# Patient Record
Sex: Male | Born: 1960 | Race: Black or African American | Hispanic: No | Marital: Married | Smoking: Never smoker
Health system: Southern US, Community
[De-identification: ages and names within clinical notes are randomized; demographics above are authoritative.]

## PROBLEM LIST (undated history)

## (undated) DIAGNOSIS — E785 Hyperlipidemia, unspecified: Secondary | ICD-10-CM

## (undated) DIAGNOSIS — K219 Gastro-esophageal reflux disease without esophagitis: Secondary | ICD-10-CM

## (undated) DIAGNOSIS — R030 Elevated blood-pressure reading, without diagnosis of hypertension: Secondary | ICD-10-CM

## (undated) DIAGNOSIS — E7800 Pure hypercholesterolemia, unspecified: Secondary | ICD-10-CM

## (undated) DIAGNOSIS — I442 Atrioventricular block, complete: Secondary | ICD-10-CM

## (undated) DIAGNOSIS — I2510 Atherosclerotic heart disease of native coronary artery without angina pectoris: Secondary | ICD-10-CM

## (undated) DIAGNOSIS — N182 Chronic kidney disease, stage 2 (mild): Secondary | ICD-10-CM

## (undated) DIAGNOSIS — Z951 Presence of aortocoronary bypass graft: Secondary | ICD-10-CM

## (undated) HISTORY — DX: Gastro-esophageal reflux disease without esophagitis: K21.9

## (undated) HISTORY — PX: CARDIAC CATHETERIZATION: SHX172

## (undated) HISTORY — DX: Pure hypercholesterolemia, unspecified: E78.00

## (undated) HISTORY — DX: Presence of aortocoronary bypass graft: Z95.1

## (undated) HISTORY — DX: Atherosclerotic heart disease of native coronary artery without angina pectoris: I25.10

## (undated) HISTORY — DX: Elevated blood-pressure reading, without diagnosis of hypertension: R03.0

---

## 2007-05-19 DIAGNOSIS — E7800 Pure hypercholesterolemia, unspecified: Secondary | ICD-10-CM | POA: Insufficient documentation

## 2007-05-19 HISTORY — DX: Pure hypercholesterolemia, unspecified: E78.00

## 2013-06-14 DIAGNOSIS — I10 Essential (primary) hypertension: Secondary | ICD-10-CM

## 2013-06-14 DIAGNOSIS — R030 Elevated blood-pressure reading, without diagnosis of hypertension: Secondary | ICD-10-CM

## 2013-06-14 DIAGNOSIS — K219 Gastro-esophageal reflux disease without esophagitis: Secondary | ICD-10-CM

## 2013-06-14 HISTORY — DX: Essential (primary) hypertension: I10

## 2013-06-14 HISTORY — DX: Elevated blood-pressure reading, without diagnosis of hypertension: R03.0

## 2013-06-14 HISTORY — DX: Gastro-esophageal reflux disease without esophagitis: K21.9

## 2017-10-12 ENCOUNTER — Encounter (HOSPITAL_COMMUNITY)
Admission: AD | Disposition: A | Discharge status: Home / Self Care | Payer: Self-pay | Source: Other Acute Inpatient Hospital | Attending: Cardiothoracic Surgery

## 2017-10-12 ENCOUNTER — Inpatient Hospital Stay (HOSPITAL_COMMUNITY): Payer: 59

## 2017-10-12 ENCOUNTER — Inpatient Hospital Stay (HOSPITAL_COMMUNITY)
Admission: AD | Admit: 2017-10-12 | Discharge: 2017-10-17 | DRG: 234 | Disposition: A | Discharge status: Home / Self Care | Payer: 59 | Source: Other Acute Inpatient Hospital | Attending: Cardiothoracic Surgery | Admitting: Cardiothoracic Surgery

## 2017-10-12 DIAGNOSIS — E8770 Fluid overload, unspecified: Secondary | ICD-10-CM | POA: Diagnosis present

## 2017-10-12 DIAGNOSIS — Z8249 Family history of ischemic heart disease and other diseases of the circulatory system: Secondary | ICD-10-CM | POA: Diagnosis not present

## 2017-10-12 DIAGNOSIS — D696 Thrombocytopenia, unspecified: Secondary | ICD-10-CM | POA: Diagnosis present

## 2017-10-12 DIAGNOSIS — E785 Hyperlipidemia, unspecified: Secondary | ICD-10-CM

## 2017-10-12 DIAGNOSIS — I2510 Atherosclerotic heart disease of native coronary artery without angina pectoris: Secondary | ICD-10-CM | POA: Diagnosis present

## 2017-10-12 DIAGNOSIS — I25110 Atherosclerotic heart disease of native coronary artery with unstable angina pectoris: Secondary | ICD-10-CM

## 2017-10-12 DIAGNOSIS — Z09 Encounter for follow-up examination after completed treatment for conditions other than malignant neoplasm: Secondary | ICD-10-CM

## 2017-10-12 DIAGNOSIS — R079 Chest pain, unspecified: Secondary | ICD-10-CM

## 2017-10-12 DIAGNOSIS — Z6838 Body mass index (BMI) 38.0-38.9, adult: Secondary | ICD-10-CM | POA: Diagnosis not present

## 2017-10-12 DIAGNOSIS — R001 Bradycardia, unspecified: Secondary | ICD-10-CM | POA: Diagnosis present

## 2017-10-12 DIAGNOSIS — Z01810 Encounter for preprocedural cardiovascular examination: Secondary | ICD-10-CM

## 2017-10-12 DIAGNOSIS — I517 Cardiomegaly: Secondary | ICD-10-CM | POA: Diagnosis present

## 2017-10-12 DIAGNOSIS — I214 Non-ST elevation (NSTEMI) myocardial infarction: Principal | ICD-10-CM | POA: Diagnosis present

## 2017-10-12 DIAGNOSIS — D62 Acute posthemorrhagic anemia: Secondary | ICD-10-CM | POA: Diagnosis not present

## 2017-10-12 DIAGNOSIS — R7303 Prediabetes: Secondary | ICD-10-CM | POA: Diagnosis present

## 2017-10-12 DIAGNOSIS — E669 Obesity, unspecified: Secondary | ICD-10-CM | POA: Diagnosis present

## 2017-10-12 DIAGNOSIS — L918 Other hypertrophic disorders of the skin: Secondary | ICD-10-CM | POA: Diagnosis present

## 2017-10-12 DIAGNOSIS — I4891 Unspecified atrial fibrillation: Secondary | ICD-10-CM | POA: Diagnosis not present

## 2017-10-12 DIAGNOSIS — Z951 Presence of aortocoronary bypass graft: Secondary | ICD-10-CM

## 2017-10-12 DIAGNOSIS — J939 Pneumothorax, unspecified: Secondary | ICD-10-CM

## 2017-10-12 HISTORY — DX: Hyperlipidemia, unspecified: E78.5

## 2017-10-12 HISTORY — PX: ULTRASOUND GUIDANCE FOR VASCULAR ACCESS: SHX6516

## 2017-10-12 HISTORY — PX: LEFT HEART CATH AND CORONARY ANGIOGRAPHY: CATH118249

## 2017-10-12 HISTORY — PX: INTRAVASCULAR PRESSURE WIRE/FFR STUDY: CATH118243

## 2017-10-12 HISTORY — DX: Non-ST elevation (NSTEMI) myocardial infarction: I21.4

## 2017-10-12 LAB — LIPID PANEL
Cholesterol: 264 mg/dL — ABNORMAL HIGH (ref 0–200)
HDL: 60 mg/dL (ref >40)
LDL Cholesterol: 179 mg/dL — ABNORMAL HIGH (ref 0–99)
Total CHOL/HDL Ratio: 4.4 RATIO
Triglycerides: 126 mg/dL (ref <150)
VLDL: 25 mg/dL (ref 0–40)

## 2017-10-12 LAB — CBC WITH DIFFERENTIAL/PLATELET
Basophils Absolute: 0 10*3/uL (ref 0.0–0.1)
Basophils Relative: 0 %
Eosinophils Absolute: 0 10*3/uL (ref 0.0–0.7)
Eosinophils Relative: 1 %
HCT: 40.2 % (ref 39.0–52.0)
Hemoglobin: 13.9 g/dL (ref 13.0–17.0)
Lymphocytes Relative: 16 %
Lymphs Abs: 1.3 10*3/uL (ref 0.7–4.0)
MCH: 30.9 pg (ref 26.0–34.0)
MCHC: 34.6 g/dL (ref 30.0–36.0)
MCV: 89.3 fL (ref 78.0–100.0)
Monocytes Absolute: 0.2 10*3/uL (ref 0.1–1.0)
Monocytes Relative: 3 %
Neutro Abs: 6.3 10*3/uL (ref 1.7–7.7)
Neutrophils Relative %: 80 %
Platelets: 157 10*3/uL (ref 150–400)
RBC: 4.5 MIL/uL (ref 4.22–5.81)
RDW: 13.6 % (ref 11.5–15.5)
WBC: 7.9 10*3/uL (ref 4.0–10.5)

## 2017-10-12 LAB — COMPREHENSIVE METABOLIC PANEL
ALT: 27 U/L (ref 17–63)
ALT: 27 U/L (ref 17–63)
AST: 41 U/L (ref 15–41)
AST: 42 U/L — ABNORMAL HIGH (ref 15–41)
Albumin: 3.7 g/dL (ref 3.5–5.0)
Albumin: 3.7 g/dL (ref 3.5–5.0)
Alkaline Phosphatase: 71 U/L (ref 38–126)
Alkaline Phosphatase: 75 U/L (ref 38–126)
Anion gap: 7 (ref 5–15)
Anion gap: 6 (ref 5–15)
BUN: 18 mg/dL (ref 6–20)
BUN: 11 mg/dL (ref 6–20)
CO2: 27 mmol/L (ref 22–32)
CO2: 27 mmol/L (ref 22–32)
Calcium: 8.9 mg/dL (ref 8.9–10.3)
Calcium: 9.1 mg/dL (ref 8.9–10.3)
Chloride: 101 mmol/L (ref 101–111)
Chloride: 104 mmol/L (ref 101–111)
Creatinine, Ser: 1.15 mg/dL (ref 0.61–1.24)
Creatinine, Ser: 1.1 mg/dL (ref 0.61–1.24)
GFR calc Af Amer: >60 mL/min (ref >60)
GFR calc Af Amer: >60 mL/min (ref >60)
GFR calc non Af Amer: >60 mL/min (ref >60)
GFR calc non Af Amer: >60 mL/min (ref >60)
Glucose, Bld: 103 mg/dL — ABNORMAL HIGH (ref 65–99)
Glucose, Bld: 86 mg/dL (ref 65–99)
Potassium: 4.4 mmol/L (ref 3.5–5.1)
Potassium: 4.4 mmol/L (ref 3.5–5.1)
Sodium: 135 mmol/L (ref 135–145)
Sodium: 137 mmol/L (ref 135–145)
Total Bilirubin: 0.9 mg/dL (ref 0.3–1.2)
Total Bilirubin: 0.9 mg/dL (ref 0.3–1.2)
Total Protein: 7 g/dL (ref 6.5–8.1)
Total Protein: 6.8 g/dL (ref 6.5–8.1)

## 2017-10-12 LAB — PROTIME-INR
INR: 1.01
INR: 0.99
Prothrombin Time: 13.2 seconds (ref 11.4–15.2)
Prothrombin Time: 13 seconds (ref 11.4–15.2)

## 2017-10-12 LAB — HEPARIN LEVEL (UNFRACTIONATED)
Heparin Unfractionated: 0.58 IU/mL (ref 0.30–0.70)
Heparin Unfractionated: 0.64 IU/mL (ref 0.30–0.70)

## 2017-10-12 LAB — ECHOCARDIOGRAM COMPLETE
Height: 70 in
Weight: 4239.89 oz

## 2017-10-12 LAB — APTT
aPTT: 77 seconds — ABNORMAL HIGH (ref 24–36)
aPTT: 74 seconds — ABNORMAL HIGH (ref 24–36)

## 2017-10-12 LAB — TYPE AND SCREEN
ABO/RH(D): A POS
Antibody Screen: NEGATIVE

## 2017-10-12 LAB — ABO/RH: ABO/RH(D): A POS

## 2017-10-12 LAB — TROPONIN I
Troponin I: 3.51 ng/mL (ref <0.03)
Troponin I: 4.36 ng/mL (ref <0.03)
Troponin I: 4.17 ng/mL (ref <0.03)

## 2017-10-12 LAB — HIV ANTIBODY (ROUTINE TESTING W REFLEX): HIV Screen 4th Generation wRfx: NONREACTIVE

## 2017-10-12 LAB — POCT ACTIVATED CLOTTING TIME: ACTIVATED CLOTTING TIME: 246 s

## 2017-10-12 SURGERY — LEFT HEART CATH AND CORONARY ANGIOGRAPHY
Anesthesia: LOCAL

## 2017-10-12 MED ORDER — HEPARIN SODIUM (PORCINE) 1000 UNIT/ML IJ SOLN
INTRAMUSCULAR | Status: AC
  Filled 2017-10-12: qty 1

## 2017-10-12 MED ORDER — SODIUM CHLORIDE 0.9% FLUSH: 3.0000 mL | Freq: Two times a day (BID) | INTRAVENOUS | Status: DC

## 2017-10-12 MED ORDER — CHLORHEXIDINE GLUCONATE 0.12 % MT SOLN
15.0000 mL | Freq: Once | OROMUCOSAL | Status: AC
  Administered 2017-10-13: 15 mL via OROMUCOSAL
  Filled 2017-10-12: qty 15

## 2017-10-12 MED ORDER — HEPARIN (PORCINE) IN NACL 2-0.9 UNIT/ML-% IJ SOLN
INTRAMUSCULAR | Status: AC
  Filled 2017-10-12: qty 1000

## 2017-10-12 MED ORDER — HEPARIN (PORCINE) IN NACL 100-0.45 UNIT/ML-% IJ SOLN
INTRAMUSCULAR | Status: AC
  Filled 2017-10-12: qty 250

## 2017-10-12 MED ORDER — SODIUM CHLORIDE 0.9 % IV SOLN
30.0000 ug/min | INTRAVENOUS | Status: AC
  Administered 2017-10-13: 30 ug/min via INTRAVENOUS
  Filled 2017-10-12: qty 2

## 2017-10-12 MED ORDER — EZETIMIBE 10 MG PO TABS
10.0000 mg | ORAL_TABLET | Freq: Every day | ORAL | Status: DC
  Administered 2017-10-12: 10 mg via ORAL
  Filled 2017-10-12: qty 1

## 2017-10-12 MED ORDER — TICAGRELOR 90 MG PO TABS
ORAL_TABLET | ORAL | Status: AC
  Filled 2017-10-12: qty 1

## 2017-10-12 MED ORDER — HEPARIN SODIUM (PORCINE) 1000 UNIT/ML IJ SOLN
INTRAMUSCULAR | Status: DC | PRN
  Administered 2017-10-12: 6000 [IU] via INTRAVENOUS
  Administered 2017-10-12: 5000 [IU] via INTRAVENOUS

## 2017-10-12 MED ORDER — ACETAMINOPHEN 325 MG PO TABS: 650.0000 mg | ORAL_TABLET | ORAL | Status: DC | PRN

## 2017-10-12 MED ORDER — HEPARIN (PORCINE) IN NACL 100-0.45 UNIT/ML-% IJ SOLN: 1350.0000 [IU]/h | INTRAMUSCULAR | Status: DC

## 2017-10-12 MED ORDER — NITROGLYCERIN 0.4 MG SL SUBL
.40 mg | SUBLINGUAL_TABLET | SUBLINGUAL | Status: DC
Start: ? — End: 2017-10-12

## 2017-10-12 MED ORDER — FENTANYL CITRATE (PF) 100 MCG/2ML IJ SOLN
INTRAMUSCULAR | Status: AC
  Filled 2017-10-12: qty 2

## 2017-10-12 MED ORDER — VERAPAMIL HCL 2.5 MG/ML IV SOLN
INTRAVENOUS | Status: AC
  Filled 2017-10-12: qty 2

## 2017-10-12 MED ORDER — ADENOSINE (DIAGNOSTIC) 140MCG/KG/MIN
INTRAVENOUS | Status: DC | PRN
  Administered 2017-10-12: 140 ug/kg/min via INTRAVENOUS

## 2017-10-12 MED ORDER — LIDOCAINE HCL (PF) 1 % IJ SOLN
INTRAMUSCULAR | Status: DC | PRN
Start: 2017-10-12 — End: 2017-10-12
  Administered 2017-10-12: 2 mL

## 2017-10-12 MED ORDER — IOPAMIDOL (ISOVUE-370) INJECTION 76%
INTRAVENOUS | Status: AC
  Filled 2017-10-12: qty 125

## 2017-10-12 MED ORDER — DOPAMINE-DEXTROSE 3.2-5 MG/ML-% IV SOLN
0.0000 ug/kg/min | INTRAVENOUS | Status: DC
  Filled 2017-10-12: qty 250

## 2017-10-12 MED ORDER — SODIUM CHLORIDE 0.9% FLUSH: 3.0000 mL | INTRAVENOUS | Status: DC | PRN

## 2017-10-12 MED ORDER — TIROFIBAN HCL IN NACL 5-0.9 MG/100ML-% IV SOLN
0.1500 ug/kg/min | INTRAVENOUS | Status: DC
  Filled 2017-10-12 (×3): qty 100

## 2017-10-12 MED ORDER — METOPROLOL TARTRATE 12.5 MG HALF TABLET
12.5000 mg | ORAL_TABLET | Freq: Once | ORAL | Status: AC
  Administered 2017-10-13: 12.5 mg via ORAL
  Filled 2017-10-12: qty 1

## 2017-10-12 MED ORDER — NITROGLYCERIN IN D5W 200-5 MCG/ML-% IV SOLN
2.0000 ug/min | INTRAVENOUS | Status: DC
  Filled 2017-10-12: qty 250

## 2017-10-12 MED ORDER — ADENOSINE 12 MG/4ML IV SOLN
INTRAVENOUS | Status: AC
  Filled 2017-10-12: qty 4

## 2017-10-12 MED ORDER — TEMAZEPAM 7.5 MG PO CAPS: 15.0000 mg | ORAL_CAPSULE | Freq: Once | ORAL | Status: DC | PRN

## 2017-10-12 MED ORDER — EPINEPHRINE PF 1 MG/ML IJ SOLN
0.0000 ug/min | INTRAVENOUS | Status: DC
  Filled 2017-10-12: qty 4

## 2017-10-12 MED ORDER — HEPARIN (PORCINE) IN NACL 2-0.9 UNIT/ML-% IJ SOLN
INTRAMUSCULAR | Status: AC | PRN
  Administered 2017-10-12: 1000 mL

## 2017-10-12 MED ORDER — FENTANYL CITRATE (PF) 100 MCG/2ML IJ SOLN
INTRAMUSCULAR | Status: DC | PRN
  Administered 2017-10-12: 25 ug via INTRAVENOUS

## 2017-10-12 MED ORDER — LIDOCAINE HCL (PF) 1 % IJ SOLN
INTRAMUSCULAR | Status: AC
  Filled 2017-10-12: qty 30

## 2017-10-12 MED ORDER — BISACODYL 5 MG PO TBEC
5.0000 mg | DELAYED_RELEASE_TABLET | Freq: Once | ORAL | Status: AC
  Administered 2017-10-12: 5 mg via ORAL
  Filled 2017-10-12: qty 1

## 2017-10-12 MED ORDER — SODIUM CHLORIDE 0.9 % IV SOLN
INTRAVENOUS | Status: AC
  Administered 2017-10-13: 1 [IU]/h via INTRAVENOUS
  Filled 2017-10-12: qty 1

## 2017-10-12 MED ORDER — ONDANSETRON HCL 4 MG/2ML IJ SOLN: 4.0000 mg | Freq: Four times a day (QID) | INTRAMUSCULAR | Status: DC | PRN

## 2017-10-12 MED ORDER — ASPIRIN 81 MG PO CHEW: 81.0000 mg | CHEWABLE_TABLET | ORAL | Status: DC

## 2017-10-12 MED ORDER — SODIUM CHLORIDE 0.9 % WEIGHT BASED INFUSION: 3.0000 mL/kg/h | INTRAVENOUS | Status: DC

## 2017-10-12 MED ORDER — MAGNESIUM SULFATE 50 % IJ SOLN
40.0000 meq | INTRAMUSCULAR | Status: DC
  Filled 2017-10-12: qty 9.85

## 2017-10-12 MED ORDER — SODIUM CHLORIDE 0.9 % WEIGHT BASED INFUSION: 1.0000 mL/kg/h | INTRAVENOUS | Status: DC

## 2017-10-12 MED ORDER — HEPARIN (PORCINE) IN NACL 100-0.45 UNIT/ML-% IJ SOLN
12.00 | INTRAMUSCULAR | Status: DC
Start: ? — End: 2017-10-12

## 2017-10-12 MED ORDER — TRANEXAMIC ACID (OHS) BOLUS VIA INFUSION
15.0000 mg/kg | INTRAVENOUS | Status: AC
  Administered 2017-10-13: 1803 mg via INTRAVENOUS
  Filled 2017-10-12: qty 1803

## 2017-10-12 MED ORDER — LABETALOL HCL 5 MG/ML IV SOLN: 10.0000 mg | INTRAVENOUS | Status: AC | PRN

## 2017-10-12 MED ORDER — POTASSIUM CHLORIDE 2 MEQ/ML IV SOLN
80.0000 meq | INTRAVENOUS | Status: DC
  Filled 2017-10-12: qty 40

## 2017-10-12 MED ORDER — DEXMEDETOMIDINE HCL IN NACL 400 MCG/100ML IV SOLN
0.1000 ug/kg/h | INTRAVENOUS | Status: AC
  Administered 2017-10-13: .2 ug/kg/h via INTRAVENOUS
  Filled 2017-10-12: qty 100

## 2017-10-12 MED ORDER — IOPAMIDOL (ISOVUE-370) INJECTION 76%
INTRAVENOUS | Status: AC
  Filled 2017-10-12: qty 50

## 2017-10-12 MED ORDER — IOPAMIDOL (ISOVUE-370) INJECTION 76%
INTRAVENOUS | Status: DC | PRN
  Administered 2017-10-12: 170 mL

## 2017-10-12 MED ORDER — TIROFIBAN HCL IN NACL 5-0.9 MG/100ML-% IV SOLN
INTRAVENOUS | Status: AC
  Filled 2017-10-12: qty 100

## 2017-10-12 MED ORDER — NITROGLYCERIN 0.4 MG SL SUBL: 0.4000 mg | SUBLINGUAL_TABLET | SUBLINGUAL | Status: DC | PRN

## 2017-10-12 MED ORDER — ASPIRIN EC 81 MG PO TBEC
81.0000 mg | DELAYED_RELEASE_TABLET | Freq: Every day | ORAL | Status: DC
  Administered 2017-10-12: 81 mg via ORAL
  Filled 2017-10-12: qty 1

## 2017-10-12 MED ORDER — HYDRALAZINE HCL 20 MG/ML IJ SOLN: 5.0000 mg | INTRAMUSCULAR | Status: AC | PRN

## 2017-10-12 MED ORDER — METOPROLOL TARTRATE 25 MG PO TABS
25.0000 mg | ORAL_TABLET | Freq: Two times a day (BID) | ORAL | Status: DC
  Administered 2017-10-12: 25 mg via ORAL
  Filled 2017-10-12: qty 1

## 2017-10-12 MED ORDER — SODIUM CHLORIDE 0.9% FLUSH
3.0000 mL | Freq: Two times a day (BID) | INTRAVENOUS | Status: DC
  Administered 2017-10-12: 3 mL via INTRAVENOUS

## 2017-10-12 MED ORDER — SODIUM CHLORIDE 0.9 % IV SOLN: 250.0000 mL | INTRAVENOUS | Status: DC | PRN

## 2017-10-12 MED ORDER — MIDAZOLAM HCL 2 MG/2ML IJ SOLN
INTRAMUSCULAR | Status: DC | PRN
  Administered 2017-10-12: 2 mg via INTRAVENOUS

## 2017-10-12 MED ORDER — VANCOMYCIN HCL 10 G IV SOLR
1500.0000 mg | INTRAVENOUS | Status: AC
  Administered 2017-10-13: 1500 mg via INTRAVENOUS
  Filled 2017-10-12: qty 1500

## 2017-10-12 MED ORDER — PLASMA-LYTE 148 IV SOLN
INTRAVENOUS | Status: AC
  Administered 2017-10-13: 09:00:00
  Filled 2017-10-12: qty 2.5

## 2017-10-12 MED ORDER — DEXTROSE 5 % IV SOLN
1.5000 g | INTRAVENOUS | Status: AC
  Administered 2017-10-13: 1.5 g via INTRAVENOUS
  Administered 2017-10-13: .75 g via INTRAVENOUS
  Filled 2017-10-12: qty 1.5

## 2017-10-12 MED ORDER — TIROFIBAN HCL IV 12.5 MG/250 ML
INTRAVENOUS | Status: AC | PRN
  Administered 2017-10-12: .15 ug/kg/min via INTRAVENOUS

## 2017-10-12 MED ORDER — VERAPAMIL HCL 2.5 MG/ML IV SOLN
INTRAVENOUS | Status: DC | PRN
  Administered 2017-10-12: 10 mL via INTRA_ARTERIAL

## 2017-10-12 MED ORDER — HEPARIN SODIUM (PORCINE) 1000 UNIT/ML IJ SOLN
2000.00 | INTRAMUSCULAR | Status: DC
Start: ? — End: 2017-10-12

## 2017-10-12 MED ORDER — CHLORHEXIDINE GLUCONATE CLOTH 2 % EX PADS
6.0000 | MEDICATED_PAD | Freq: Once | CUTANEOUS | Status: AC
  Administered 2017-10-12: 6 via TOPICAL

## 2017-10-12 MED ORDER — CEFUROXIME SODIUM 750 MG IJ SOLR
750.0000 mg | INTRAMUSCULAR | Status: DC
  Filled 2017-10-12: qty 750

## 2017-10-12 MED ORDER — TRANEXAMIC ACID 1000 MG/10ML IV SOLN
1.5000 mg/kg/h | INTRAVENOUS | Status: AC
  Administered 2017-10-13: 1.5 mg/kg/h via INTRAVENOUS
  Filled 2017-10-12: qty 25

## 2017-10-12 MED ORDER — ASPIRIN EC 81 MG PO TBEC
81.0000 mg | DELAYED_RELEASE_TABLET | Freq: Every day | ORAL | Status: DC
Start: 2017-10-13 — End: 2017-10-12

## 2017-10-12 MED ORDER — MIDAZOLAM HCL 2 MG/2ML IJ SOLN
INTRAMUSCULAR | Status: AC
  Filled 2017-10-12: qty 2

## 2017-10-12 MED ORDER — TRANEXAMIC ACID (OHS) PUMP PRIME SOLUTION
2.0000 mg/kg | INTRAVENOUS | Status: DC
Start: 2017-10-13 — End: 2017-10-13
  Filled 2017-10-12: qty 2.4

## 2017-10-12 MED ORDER — SODIUM CHLORIDE 0.9 % IV SOLN
INTRAVENOUS | Status: AC
  Administered 2017-10-12: 19:00:00 via INTRAVENOUS

## 2017-10-12 MED ORDER — HEPARIN (PORCINE) IN NACL 100-0.45 UNIT/ML-% IJ SOLN
1350.0000 [IU]/h | INTRAMUSCULAR | Status: DC
  Administered 2017-10-12: 1350 [IU]/h via INTRAVENOUS
  Filled 2017-10-12: qty 250

## 2017-10-12 MED ORDER — TIROFIBAN (AGGRASTAT) BOLUS VIA INFUSION
INTRAVENOUS | Status: DC | PRN
  Administered 2017-10-12: 3005 ug via INTRAVENOUS

## 2017-10-12 MED ORDER — CHLORHEXIDINE GLUCONATE CLOTH 2 % EX PADS
6.0000 | MEDICATED_PAD | Freq: Once | CUTANEOUS | Status: AC
  Administered 2017-10-13: 6 via TOPICAL

## 2017-10-12 MED ORDER — HEPARIN SODIUM (PORCINE) 1000 UNIT/ML IJ SOLN
INTRAMUSCULAR | Status: DC
  Filled 2017-10-12: qty 30

## 2017-10-12 SURGICAL SUPPLY — 19 items
CATH INFINITI 5FR ANG PIGTAIL (CATHETERS) ×3 IMPLANT
CATH INFINITI 5FR JL4 (CATHETERS) ×3 IMPLANT
CATH MICROCATH NAVVUS (MICROCATHETER) ×2 IMPLANT
CATH OPTITORQUE TIG 4.0 5F (CATHETERS) ×3 IMPLANT
CATH VISTA GUIDE 6FR XBLAD3.5 (CATHETERS) ×3 IMPLANT
COVER PRB 48X5XTLSCP FOLD TPE (BAG) ×2 IMPLANT
COVER PROBE 5X48 (BAG) ×1
DEVICE RAD COMP TR BAND LRG (VASCULAR PRODUCTS) ×3 IMPLANT
GLIDESHEATH SLEND A-KIT 6F 22G (SHEATH) ×3 IMPLANT
GUIDEWIRE INQWIRE 1.5J.035X260 (WIRE) ×2 IMPLANT
INQWIRE 1.5J .035X260CM (WIRE) ×3
KIT ENCORE 26 ADVANTAGE (KITS) ×3 IMPLANT
KIT HEART LEFT (KITS) ×3 IMPLANT
MICROCATHETER NAVVUS (MICROCATHETER) ×3
PACK CARDIAC CATHETERIZATION (CUSTOM PROCEDURE TRAY) ×3 IMPLANT
SYR MEDRAD MARK V 150ML (SYRINGE) ×3 IMPLANT
TRANSDUCER W/STOPCOCK (MISCELLANEOUS) ×3 IMPLANT
TUBING CIL FLEX 10 FLL-RA (TUBING) ×3 IMPLANT
WIRE COUGAR XT STRL 190CM (WIRE) ×3 IMPLANT

## 2017-10-12 NOTE — H&P (Signed)
Cardiology History & Physical    Patient ID: Scott Rosales Scott Rosales MRN: [id number], DOB: 09-28-1961 Date of Encounter: 10/12/2017, 1:34 AM Primary Physician: No primary care provider on file.  Chief Complaint: Chest pain   HPI: Scott Rosales is a 56 y.o. male with hyperlipidemia who presents with chest pain.  Patient was doing well until the evening prior to admission, when he noted the onset of dull substernal chest pressure.  He denied any associated radiation of the pain,  frank shortness of breath, excessive diaphoresis, nausea/vomiting, palpitations, syncope or symptoms of fluid excess.  The chest pressure persisted all day today, at which point he decided that he needed to be evaluated in the emergency department.  [facility], where initial workup revealed ECG without acute ischemic changes however his troponin was noted to be positive.  He was hemodynamically stable with mild chest pain throughout his ED stay there.  He was given full dose aspirin, started on heparin drip and given sublingual nitro with some improvement in his chest pressure.  [facility] was then [facility] to [facility].  Upon my interview, he felt comfortable and reported much improved chest pain although it is still not completely resolved.  PMH: Hyperlipidemia   Home Meds: Prior to Admission medications   Not on File  Aspirin 81 mg daily Fish oil capsule daily  Allergies: Severe myopathy with atorvastatin, severe myalgias with simvastatin  Social History   Socioeconomic History  . Marital status: Not on file    Spouse name: Not on file  . Number of children: Not on file  . Years of education: Not on file  . Highest education level: Not on file  Social Needs  . Financial resource strain: Not on file  . Food insecurity - worry: Not on file  . Food insecurity - inability: Not on file  . Transportation needs - medical: Not on file  . Transportation needs - non-medical: Not on  file  Occupational History  . Not on file  Tobacco Use  . Smoking status: Not on file  Substance and Sexual Activity  . Alcohol use: Not on file  . Drug use: Not on file  . Sexual activity: Not on file  Other Topics Concern  . Not on file  Social History Narrative  . Not on file   He lives in [place], and works as a Education officer, environmentalpastor.  He denies use of tobacco or alcohol.  No family history on file.  Review of Systems: All other systems reviewed and are otherwise negative except as noted above.  Labs: Outside hospital labs reviewed.  CBC, BMP and coags are within normal limits.  Creatinine is 1.1.  Initial troponin was 0.86 (upper limit of normal is 0.034).   Radiology/Studies:  No results found. Wt Readings from Last 3 Encounters:  No data found for Wt    EKG: Normal sinus rhythm, nonspecific T wave flattening and inversion in precordial leads.  No priors for comparison.  Physical Exam: Blood pressure (!) 142/89, pulse 62, temperature 97.7 F (36.5 C), temperature source Oral, resp. rate 18, SpO2 98 %. There is no height or weight on file to calculate BMI. General: Well developed, well nourished, in no acute distress. Head: Normocephalic, atraumatic, sclera non-icteric, no xanthomas, nares are without discharge.  Neck: Negative for carotid bruits. JVD not elevated. Lungs: Clear bilaterally to auscultation without wheezes, rales, or rhonchi. Breathing is unlabored. Heart: RRR with S1 S2. No murmurs, rubs,  or gallops appreciated. Abdomen: Soft, non-tender, non-distended with normoactive bowel sounds. No hepatomegaly. No rebound/guarding. No obvious abdominal masses. Msk:  Strength and tone appear normal for age. Extremities: No clubbing or cyanosis. No edema.  Distal pedal pulses are 2+ and equal bilaterally. Neuro: Alert and oriented X 3. No focal deficit. No facial asymmetry. Moves all extremities spontaneously. Psych:  Responds to questions appropriately with a normal affect.     Assessment and Plan  56 year old man with hyperlipidemia, who presents with chest pain, found to have NSTEMI.  1.  NSTEMI: Chest pain much improved on presentation to [facility].  Plan to continue heparin drip and aspirin.  Will start low-dose lisinopril now.  Heart rate is approximately 60, will consider starting beta-blocker depending on heart rate trend, but it is presently at goal.  Anticipate cardiac catheterization on Monday, unless we are unable to control chest pain pharmacologically, in which case we would plan for urgent catheterization over the weekend.  Echocardiogram has been ordered.  2.  Hyperlipidemia: Patient has been unable to tolerate statins, per his report.  Given acute MI, would consider arranging for PCS K9 inhibition in the outpatient setting.  Signed, [name], MD 10/12/2017, 1:34 AM

## 2017-10-12 NOTE — Progress Notes (Signed)
Patient currently stable.  Catheterization films were reviewed and discussed with Dr. Herbie BaltimoreHarding and Dr. Tyrone SageGerhardt.  Echocardiogram shows mild to moderate LVH with preserved LV function.  Very localized area of inferior hypokinesis.  Agree with plans for bypass in the morning.  At the present time he had a very mild area of firmness above the TR band and spoke to Dr. Herbie BaltimoreHarding about this.  Aggrastat is to be held for the time being.  Dr. Herbie BaltimoreHarding to assess later.  Darden PalmerW. Spencer Erikah Thumm, Jr. MD [facility] 5:32 PM

## 2017-10-12 NOTE — Interval H&P Note (Signed)
History and Physical Interval Note:  10/12/2017 12:41 PM  Scott Rosales  has presented today for surgery, with the diagnosis of Urgent NSTEMI.  The various methods of treatment have been discussed with the patient and family. After consideration of risks, benefits and other options for treatment, the patient has consented to  Procedure(s): LEFT HEART CATH AND CORONARY ANGIOGRAPHY (N/A) with possible PERCUTANEOUS CORONARY INTERVENTION as a surgical intervention .  The patient's history has been reviewed, patient examined, no change in status, stable for surgery.  I have reviewed the patient's chart and labs.  Questions were answered to the patient's satisfaction.    Cath Lab Visit (complete for each Cath Lab visit)  Clinical Evaluation Leading to the Procedure:   ACS: Yes.    Non-ACS:    Anginal Classification: CCS IV  Anti-ischemic medical therapy: Minimal Therapy (1 class of medications)  Non-Invasive Test Results: No non-invasive testing performed  Prior CABG: No previous CABG   Bryan Lemmaavid Harding

## 2017-10-12 NOTE — Progress Notes (Signed)
CRITICAL VALUE ALERT  Critical Value: Troponin-3.51  Date & Time Notified: 10/12/17 @ 3:45am  Provider Notified: Dr. Allena Katzhakravartti  Orders Received/Actions taken: Will continue to monitor.

## 2017-10-12 NOTE — Progress Notes (Signed)
[facility] in [facility]. Heparin gtt infusing upon arrival. Reports mild chest pain 2/10. Dr. Allena Katzhakravartti notified of pt arrival. Will continue to monitor.

## 2017-10-12 NOTE — Brief Op Note (Signed)
BRIEF CARDIAC CATHETERIZATION REPORT  PCP: No primary care provider on file. Referring physician: Dr. Jacolyn Reedy  SURGEON:  Surgeon(s) and Role:    * Leonie Man, MD - Primary   10/12/2017;  2:09 PM  PATIENT:  Scott Rosales  56 y.o. male with hypertension hyperlipidemia who presented with a non-STEMI.  He had subtle inferior ST elevations that did not meet criteria for STEMI, however he was having persistent pain and therefore was referred for urgent non-STEMI cardiac catheterization.  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  Mild to moderate disease throughout the RCA with RPL.  Ectatic, aneurysmal left main, ostial-proximal LAD dilation.  Well-preserved LVEF with mid inferior wall hypokinesis but otherwise no significant wall motion and normalities.  Moderately elevated LVEDP  PROCEDURE:  Procedure(s): LEFT HEART CATH AND CORONARY ANGIOGRAPHY (N/A) Ultrasound Guidance For Vascular Access INTRAVASCULAR PRESSURE WIRE/FFR STUDY (N/A)  Time Out: Verified patient identification, verified procedure, site/side was marked, verified correct patient position, special equipment/implants available, medications/allergies/relevent history reviewed, required imaging and test results available. Performed.  Consent Signed.  Access:  Right radial Artery: 6 Fr sheath -- Seldinger technique using Angiocath Micropuncture Kit * 10 mL radial cocktail IA; 6000 units IV Heparin  Left Heart Catheterization: 5 Fr Catheters advanced or exchanged over a J-wire under direct fluoroscopic guidance into the ascending aorta; TIG 4.0 catheter advanced first.  After initial review of the angiography, there was significant disease noted with ostial PDA occlusion, bifurcation  circumflex-OM1 lesions but diffuse disease in the LAD.  Based on the extent of disease that decided to perform FFR on the LAD to evaluate for significance and if not positive would do bifurcation circumflex-OM PCI.  Preparations made for FFR. *Additional bolus of 5000+3000 units IV heparin administered Left Coronary Artery Cineangiography: JL4 catheter  FFR performed: 6 French XB LAD guide, cougar wire, Navuss Microcatheter (FFR Catheter) for FFR Adenosine infused at 140 mcg/Kg/min --> infused for 90 seconds Initial FFR 0.93.  Final FFR 0.79 --> physiologically significant Right Coronary Artery Cineangiography: TIG 4.0 catheter   LV Hemodynamics (LV Gram): Angled pigtail  - After completion of angiography, the catheter was removed completely out of the body over wire without complication.  Radial sheath(s) removed in the Cath Lab with TR band placed for hemostasis.   TR Band: 1405 Hours; 15 mL air  MEDICATIONS * SQ Lidocaine 77m * Radial Cocktail: 3 mg Verapamil in 10 mL NS * Isovue Contrast: 170 mL * Heparin: Total 14,000 units * IV adenosine infused 140 mcg/KG/min times 92nd  EBL:   < 50 ml  TOURNIQUET:  TR Band -1405 hrs., 15 mL air  DICTATION: .Note written in EPIC  PLAN OF CARE: Return to nursing unit for ongoing care.  TR band removal per protocol  The patient has three-vessel disease.  We are in the process of calling CV TS for CABG consult  I will start IV Aggrastat now and restart heparin in 8 hours given the ulcerated thrombotic  appearance of the circumflex bifurcation lesion and is ongoing pain.  We will start low-dose beta-blocker and high-dose statin  He will had to return back to his nursing unit because there are no stepdown or ICU beds available --he is currently chest pain-free  PATIENT DISPOSITION:  Return to invert nursing unit.  Hemodynamically stable.  Chest pain-free.   Delay start of Pharmacological VTE agent (>24hrs) due to surgical blood loss or risk  of bleeding: not applicable   Glenetta Hew, M.D., M.S. Interventional Cardiologist   Pager # [phone] Phone # [phone] [address] [place], [place] [address]

## 2017-10-12 NOTE — H&P (View-Only) (Signed)
Subjective:  History reviewed from last night.  He was [facility] where he has had some ongoing chest discomfort since Friday night when [place].  EKG to my eyes today shows some inferolateral ST elevation very minimal with T wave inversions in V3.  He states his chest pain is 1-2 and has come down somewhat.  He is on IV heparin.  Objective:  Vital Signs in the last 24 hours: BP (!) 129/91 (BP Location: Right Arm)   Pulse 74   Temp 97.9 F (36.6 C) (Oral)   Resp 18   Ht 5\' 10"  (1.778 m)   Wt 120.2 kg (264 lb 15.9 oz)   SpO2 98%   BMI 38.02 kg/m   Physical Exam: Obese black male in no acute distress Lungs:  Clear Cardiac:  Regular rhythm, normal S1 and S2, no S3 Extremities:  No edema present, radial pulse present  Intake/Output from previous day: 12/01 0701 - 12/02 0700 In: 458 [P.O.:360; I.V.:98] Out: -   Weight Filed Weights   10/12/17 0200  Weight: 120.2 kg (264 lb 15.9 oz)    Lab Results: Basic Metabolic Panel: Recent Labs    10/12/17 0139  NA 135  K 4.4  CL 101  CO2 27  GLUCOSE 103*  BUN 18  CREATININE 1.15   CBC: Recent Labs    10/12/17 0139  WBC 7.9  NEUTROABS 6.3  HGB 13.9  HCT 40.2  MCV 89.3  PLT 157   Cardiac Panel (last 3 results) Recent Labs    10/12/17 0139 10/12/17 0700  TROPONINI 3.51* 4.36*    Telemetry: Sinus rhythm, artifact  Assessment/Plan:  1.  Non-STEMI with persistent chest discomfort and abnormal EKG 2.  Hyperlipidemia with statin intolerance 3.  Obesity  Recommendations:  Discussed with interventional cardiology.  The patient is having ongoing chest discomfort and troponin with mild elevation.  EKG personally reviewed to my eyes shows what looks like subtle inferolateral ST elevation and some T wave inversion in V3.  He might need to go to the lab later on today and will defer to interventional cardiology.     Darden PalmerW. Spencer Tilley, Jr.  MD  [facility] Cardiology  10/12/2017, 10:29 AM

## 2017-10-12 NOTE — Progress Notes (Signed)
Pre-op Cardiac Surgery  Carotid Findings:  1-39% ICA plaquing. Vertebral artery flow is antegrade.   Upper Extremity Right Left  Brachial Pressures 148T 155T  Radial Waveforms T T  Ulnar Waveforms T T  Palmar Arch (Allen's Test) Doppler signal remains normal with radial compression and obliterate with ulnar compression Doppler signal diminishes >50% with radial compression and obliterate with ulnar compression   Findings:      Lower  Extremity Right Left  Dorsalis Pedis    Anterior Tibial 189T 185T  Posterior Tibial 176T 162T  Ankle/Brachial Indices 1.2 1.2    Findings:  WNL

## 2017-10-12 NOTE — Progress Notes (Signed)
  Echocardiogram 2D Echocardiogram has been performed.  Scott Rosales Scott Rosales 10/12/2017, 3:29 PM

## 2017-10-12 NOTE — Progress Notes (Signed)
Subjective:  History reviewed from last night.  He was brought in from [facility] where he has had some ongoing chest discomfort since Friday night when he was on a retreat at the beach.  EKG to my eyes today shows some inferolateral ST elevation very minimal with T wave inversions in V3.  He states his chest pain is 1-2 and has come down somewhat.  He is on IV heparin.  Objective:  Vital Signs in the last 24 hours: BP (!) 129/91 (BP Location: Right Arm)   Pulse 74   Temp 97.9 F (36.6 C) (Oral)   Resp 18   Ht 5' 10" (1.778 m)   Wt 120.2 kg (264 lb 15.9 oz)   SpO2 98%   BMI 38.02 kg/m   Physical Exam: Obese black male in no acute distress Lungs:  Clear Cardiac:  Regular rhythm, normal S1 and S2, no S3 Extremities:  No edema present, radial pulse present  Intake/Output from previous day: 12/01 0701 - 12/02 0700 In: 458 [P.O.:360; I.V.:98] Out: -   Weight Filed Weights   10/12/17 0200  Weight: 120.2 kg (264 lb 15.9 oz)    Lab Results: Basic Metabolic Panel: Recent Labs    10/12/17 0139  NA 135  K 4.4  CL 101  CO2 27  GLUCOSE 103*  BUN 18  CREATININE 1.15   CBC: Recent Labs    10/12/17 0139  WBC 7.9  NEUTROABS 6.3  HGB 13.9  HCT 40.2  MCV 89.3  PLT 157   Cardiac Panel (last 3 results) Recent Labs    10/12/17 0139 10/12/17 0700  TROPONINI 3.51* 4.36*    Telemetry: Sinus rhythm, artifact  Assessment/Plan:  1.  Non-STEMI with persistent chest discomfort and abnormal EKG 2.  Hyperlipidemia with statin intolerance 3.  Obesity  Recommendations:  Discussed with interventional cardiology.  The patient is having ongoing chest discomfort and troponin with mild elevation.  EKG personally reviewed to my eyes shows what looks like subtle inferolateral ST elevation and some T wave inversion in V3.  He might need to go to the lab later on today and will defer to interventional cardiology.     W. Spencer Aslyn Cottman, Jr.  MD  [facility] Cardiology  10/12/2017, 10:29 AM   

## 2017-10-12 NOTE — Consult Note (Signed)
[address]       [place], [id number]             [phone]        [name] Drew [facility] Medical Record #[id number] Date of Birth: Nov 16, 1960  Referring: Dr. Herbie BaltimoreHarding Primary cardiologist: Dr. Donnie Ahoilley Primary Care: No primary care provider on file.  Chief Complaint:   Chest pain-non-STEMI myocardial infarction 36 hours   History of Present Illness:     Patient is a 56 year old male with known hyperlipidemia, reportedly intolerant of statins, who was admitted with a non-STEMI myocardial infarction around 1:30 AM today.  Cardiac catheterization was performed this afternoon reportedly because the patient had eaten breakfast.  The patient had been on a trip to the beach and noted substernal discomfort all day yesterday and ultimately [facility].  Initial workup revealed EKG without acute ischemic changes but his troponin was elevated.  He was given sublingual nitroglycerin started on a heparin drip and and [facility]  Patient has no previous known history of coronary artery disease, he denies diabetes is a lifelong non-smoker, his family history is significant for father who died of a myocardial infarction at age 57, his mother is still alive   Current Activity/ Functional Status: Patient is independent with mobility/ambulation, transfers, ADL's, IADL's.   Zubrod Score: At the time of surgery this patient's most appropriate activity status/level should be described as: [x]     0    Normal activity, no symptoms []     1    Restricted in physical strenuous activity but ambulatory, able to do out light work []     2    Ambulatory and capable of self care, unable to do work activities, up and about                 more than 50%  Of the time                            []     3    Only limited self care, in bed greater than 50% of waking hours []     4    Completely disabled, no self care, confined to  bed or chair []     5    Moribund  Past medical history: Intolerance of statins History of hyperlipidemia Medications at the time of admission include aspirin 81 mg a day and fish oil daily    Social History   Tobacco Use  Smoking Status  patient is a lifelong non-smoker    Social History   Substance and Sexual Activity  Alcohol Use  denies alcohol use    Social History   Socioeconomic History  . Marital status:  Patient is married  Occupational History  t  retired in the spring of this year from [employer], still works as a Education officer, environmentalpastor  Tobacco Use  . Smoking status:  Lifelong non-smoker  Substance and Sexual Activity  . Alcohol use:  Denies alcohol use  . Drug use: Not on file  . Sexual activity: Not on file               Allergies: Patient has over several years tried multiple different statins but notes that he has been intolerant of all of them, currently takes fish oil only  Current Facility-Administered Medications  Medication Dose Route Frequency Provider Last Rate Last  Dose  . acetaminophen (TYLENOL) tablet 650 mg  650 mg Oral Q4H PRN Chakravartti, Jaidip, MD      . aspirin EC tablet 81 mg  81 mg Oral Daily Marykay Lex, MD      . ezetimibe (ZETIA) tablet 10 mg  10 mg Oral q1800 Marykay Lex, MD      . heparin ADULT infusion 100 units/mL (25000 units/253mL sodium chloride 0.45%)  1,350 Units/hr Intravenous Continuous Othella Boyer, MD      . metoprolol tartrate (LOPRESSOR) tablet 25 mg  25 mg Oral BID Marykay Lex, MD      . nitroGLYCERIN (NITROSTAT) SL tablet 0.4 mg  0.4 mg Sublingual Q5 Min x 3 PRN Chakravartti, Jaidip, MD      . ondansetron (ZOFRAN) injection 4 mg  4 mg Intravenous Q6H PRN Chakravartti, Jaidip, MD        Medications Prior to Admission  Medication Sig Dispense Refill Last Dose  . aspirin EC 81 MG tablet Take 81 mg by mouth daily.   10/11/2017 at Unknown time    Family history  Review of Systems:  Pertinent items  are noted in HPI.     Cardiac Review of Systems: Y or N  Chest Pain [  y  ]  Resting SOB [ y  ] Exertional SOB  [ y ]  Orthopnea [ n ]   Pedal Edema [n   ]    Palpitations [  n] Syncope  [n  ]   Presyncope [  n ]  General Review of Systems: [Y] = yes [  ]=no Constitional: recent weight change [ n; anorexia [  ]; fatigue [  ]; nausea [  ]; night sweats [  ]; fever [  ]; or chills [  ]                                                               Dental: poor dentition[  n]; Last Dentist visit:   Eye : blurred vision [ n ]; diplopia [   ]; vision changes [ n ];  Amaurosis fugax[n  ]; Resp: cough [ n ];  wheezing[n  ];  hemoptysis[  n]; shortness of breath[ n ]; paroxysmal nocturnal dyspnea[n  ]; dyspnea on exertion[n  ]; or orthopnea[  ];  GI:  gallstones[  ], vomiting[  ];  dysphagia[  ]; melena[  ];  hematochezia [  ]; heartburn[  ];   Hx of  Colonoscopy[  ]; GU: kidney stones [ n ]; hematuria[n  ];   dysuria [  ];  nocturia[  ];  history of     obstruction [  ]; urinary frequency [  ]             Skin: rash, swelling[  ];, hair loss[  ];  peripheral edema[  ];  or itching[  ]; Musculosketetal: myalgias[ n ];  joint swelling[ n ];  joint erythema[  ];  joint pain[  ];  back pain[  ];  Heme/Lymph: bruising[  ];  bleeding[  ];  anemia[  ];  Neuro: TIA[ n ];  headaches[n  ];  stroke[ n ];  vertigo[  ];  seizures[  ];   paresthesias[  ];  difficulty walking[  ];  Psych:depression[ n ]; anxiety[n ];  Endocrine: diabetes[n  ];  thyroid dysfunction[ n ];  Immunizations: Flu Milo.Brash[n  ]; Pneumococcal[ n ];  Other:  Physical Exam: BP (!) 129/91 (BP Location: Right Arm)   Pulse 74   Temp 97.9 F (36.6 C) (Oral)   Resp 18   Ht 5\' 10"  (1.778 m)   Wt 264 lb 15.9 oz (120.2 kg)   SpO2 100%   BMI 38.02 kg/m    General appearance: alert, cooperative, appears stated age and no distress Head: Normocephalic, without obvious abnormality, atraumatic Neck: no adenopathy, no carotid bruit, no JVD, supple,  symmetrical, trachea midline and thyroid not enlarged, symmetric, no tenderness/mass/nodules Lymph nodes: Cervical, supraclavicular, and axillary nodes normal. Resp: clear to auscultation bilaterally Back: symmetric, no curvature. ROM normal. No CVA tenderness. Cardio: regular rate and rhythm, S1, S2 normal, no murmur, click, rub or gallop GI: soft, non-tender; bowel sounds normal; no masses,  no organomegaly Extremities: extremities normal, atraumatic, no cyanosis or edema and Homans sign is negative, no sign of DVT Neurologic: Grossly normal  Diagnostic Studies & Laboratory data:     Recent Radiology Findings: none done    Recent Lab Findings: Lab Results  Component Value Date   WBC 7.9 10/12/2017   HGB 13.9 10/12/2017   HCT 40.2 10/12/2017   PLT 157 10/12/2017   GLUCOSE 103 (H) 10/12/2017   CHOL 264 (H) 10/12/2017   TRIG 126 10/12/2017   HDL 60 10/12/2017   LDLCALC 179 (H) 10/12/2017   ALT 27 10/12/2017   AST 41 10/12/2017   NA 135 10/12/2017   K 4.4 10/12/2017   CL 101 10/12/2017   CREATININE 1.15 10/12/2017   BUN 18 10/12/2017   CO2 27 10/12/2017   INR 1.01 10/12/2017    Lab Results  Component Value Date   TROPONINI 4.36 (HH) 10/12/2017   Initial troponin 0.8   ECHO: none done  CATH:report incomplete but :  Left Heart Catheterization: 5 Fr Catheters advanced or exchanged over a J-wire under direct fluoroscopic guidance into the ascending aorta; TIG 4.0 catheter advanced first.   After initial review of the angiography, there was significant disease noted with ostial PDA occlusion, bifurcation circumflex-OM1 lesions but diffuse disease in the LAD.  Based on the extent of disease that decided to perform FFR on the LAD to evaluate for significance and if not positive would do bifurcation circumflex-OM PCI.  Preparations made for FFR.  *Additional bolus of 5000+3000 units IV heparin administered  Left Coronary Artery Cineangiography: JL4 catheter   FFR  performed: 6 French XB LAD guide, cougar wire, Navuss Microcatheter (FFR Catheter) for FFR  Adenosine infused at 140 mcg/Kg/min --> infused for 90 seconds  Initial FFR 0.93.  Final FFR 0.79 --> physiologically significant  Right Coronary Artery Cineangiography: TIG 4.0 catheter  ? LV Hemodynamics (LV Gram): Angled pigtail  - After completion of angiography, the catheter was removed completely out of the body over wire without complication.  Radial sheath(s) removed in the Cath Lab with TR band placed for hemostasis.       Assessment / Plan:   Non-STEMI myocardial infarction with troponin IV 0.36, cardiac catheterization performed this afternoon, patient now on IV Integrilin-with hemodynamically significant LAD disease and complex bifurcation circumflex and OM vessel coronary artery bypass offers the best long-term relief of symptoms and preservation of LV function for the patient.  Risks and options were discussed with the patient and his wife and family in detail, including the risk of  bypass surgery including death infection stroke myocardial infarction bleeding blood transfusion.  Currently on Integrilin the patient is stable without significant chest pain.  We will plan to proceed with coronary artery bypass grafting tomorrow.  Patient and his family have had their questions answered and he is willing to proceed The goals risks and alternatives of the planned surgical procedure CABG   have been discussed with the patient in detail. The risks of the procedure including death, infection, stroke, myocardial infarction, bleeding, blood transfusion have all been discussed specifically.  I have quoted [name] a 3 % of perioperative mortality and a complication rate as high as 40 %. The patient's questions have been answered.Ruford Ned Kakar is willing  to proceed with the planned procedure.  Hyperlipidemia Obesity-  BMI 38    I  spent 40 minutes counseling the patient face to  face and 50% or more the  time was spent in counseling and coordination of care. The total time spent in the appointment was 60 minutes.    Delight Ovens MD      [address] [place] [place] [address] Office [phone]   Beeper [phone]  10/12/2017 2:38 PM

## 2017-10-12 NOTE — Progress Notes (Signed)
ANTICOAGULATION CONSULT NOTE - Initial Consult  Pharmacy Consult for heparin  Indication: chest pain/ACS  No Known Allergies  Patient Measurements: Height: 5\' 10"  (177.8 cm) Weight: 264 lb 15.9 oz (120.2 kg) IBW/kg (Calculated) : 73 Heparin Dosing Weight: 99.9 kg   Assessment: 56 yo male admitted with chest pain as [facility]. Continues on heparin gtt for ACS. No oral anticoagulation noted on PTA medication list. Last Heparin level is therapeutic at 0.64. Hgb 13.9, plts wnl.  Goal of Therapy:  Heparin level 0.3-0.7 units/ml Monitor platelets by anticoagulation protocol: Yes   Plan:  Decrease heparin gtt slightly to 1,350 units/hr  Monitor daily heparin level, CBC, s/s of bleed Cath Monday  Enzo BiNathan Mikayla Chiusano, PharmD, [facility] Clinical Pharmacist Pager [phone] 10/12/2017 10:41 AM

## 2017-10-12 NOTE — Progress Notes (Addendum)
ANTICOAGULATION CONSULT NOTE - Initial Consult  Pharmacy Consult for heparin  Indication: chest pain/ACS  Allergies not on file  Patient Measurements: Height: 5\' 10"  (177.8 cm) Weight: 264 lb 15.9 oz (120.2 kg) IBW/kg (Calculated) : 73 Heparin Dosing Weight: 99.9 kg   Vital Signs: Temp: 97.7 F (36.5 C) (12/02 0030) Temp Source: Oral (12/02 0030) BP: 142/89 (12/02 0030) Pulse Rate: 62 (12/02 0030)  Labs: No results for input(s): HGB, HCT, PLT, APTT, LABPROT, INR, HEPARINUNFRC, HEPRLOWMOCWT, CREATININE, CKTOTAL, CKMB, TROPONINI in the last 72 hours.  Medical History: No past medical history on file.  Assessment: 56 yo male admitted with chest pain as [facility] Patient arrived on heparin gtt and pharmacy has been consulted to continue. No oral anticoagulation noted on PTA medication list.   Per transfer records and current bedside RN, patient received heparin bolus 5000 units IV x1, followed by heparin gtt at 12 units/kg/hr (running at 14.2 mL/hr [facility] RN rounded rate to 1400 units/hr on patient arrival. Heparin gtt was started at OSH at 2100 on 12/1.   From OSH, hgb 13.7 and plts 157. No s/s bleeding noted.   Goal of Therapy:  Heparin level 0.3-0.7 units/ml Monitor platelets by anticoagulation protocol: Yes   Plan:  Continue heparin gtt at 1400 units/hr  Heparin level with AM labs  Daily heparin level and CBC  Monitor for s/s bleeding F/u cardiology plan, possible cath Monday   Einar CrowKatherine Tamari Redwine, PharmD Clinical Pharmacist 10/12/17 2:09 AM   ADDENDUM:  Heparin level drawn at ~5 hrs is therapeutic at 0.58  CBC stable and no bleeding noted Continue heparin gtt at 1400 units/hr Will confirm heparin level at 0730 with next troponin  Daily heparin level and CBC Monitor for s/s bleeding  Einar CrowKatherine Erion Hermans, PharmD Clinical Pharmacist 10/12/17 3:13 AM

## 2017-10-12 NOTE — Progress Notes (Signed)
ANTICOAGULATION CONSULT NOTE - Consult  Pharmacy Consult for heparin  Indication: chest pain/ACS  No Known Allergies  Patient Measurements: Height: 5\' 10"  (177.8 cm) Weight: 264 lb 15.9 oz (120.2 kg) IBW/kg (Calculated) : 73 Heparin Dosing Weight: 99.9 Kg  Vital Signs: Temp: 97.9 F (36.6 C) (12/02 0700) Temp Source: Oral (12/02 0700) BP: 129/91 (12/02 0700) Pulse Rate: 74 (12/02 0700)  Labs: Recent Labs    10/12/17 0139 10/12/17 0700  HGB 13.9  --   HCT 40.2  --   PLT 157  --   APTT 77*  --   LABPROT 13.2  --   INR 1.01  --   HEPARINUNFRC 0.58 0.64  CREATININE 1.15  --   TROPONINI 3.51* 4.36*   Estimated Creatinine Clearance: 93.2 mL/min (by C-G formula based on SCr of 1.15 mg/dL).  Medical History: No past medical history on file.  Medications:  Medications Prior to Admission  Medication Sig Dispense Refill Last Dose  . aspirin EC 81 MG tablet Take 81 mg by mouth daily.   10/11/2017 at Unknown time   Assessment: 56 yo male admitted with chest pain as [facility]. Continues on heparin gtt for ACS. No oral anticoagulation noted on PTA medication list. Patient is now s/p cardiac cath. Pharmacy consulted to restart heparin 8 hours post sheath removal which occurred at ~1400.  Last Heparin level is therapeutic at 0.64. Hgb 13.9, plts wnl.  Goal of Therapy:  Heparin level 0.3-0.7 units/ml Monitor platelets by anticoagulation protocol: Yes   Plan:  Resume heparin infusion at 1350 units/hr tonight at 2200 Check anti-Xa level in 8 hours and daily while on heparin Continue to monitor H&H and platelets  Dimarco Minkin L Phyillis Dascoli 10/12/2017,2:33 PM

## 2017-10-13 ENCOUNTER — Inpatient Hospital Stay (HOSPITAL_COMMUNITY): Payer: 59 | Admitting: Anesthesiology

## 2017-10-13 ENCOUNTER — Inpatient Hospital Stay (HOSPITAL_COMMUNITY): Payer: 59

## 2017-10-13 ENCOUNTER — Encounter (HOSPITAL_COMMUNITY): Payer: Self-pay | Admitting: Cardiology

## 2017-10-13 ENCOUNTER — Encounter (HOSPITAL_COMMUNITY)
Admission: AD | Disposition: A | Discharge status: Home / Self Care | Payer: Self-pay | Source: Other Acute Inpatient Hospital | Attending: Cardiothoracic Surgery

## 2017-10-13 DIAGNOSIS — Z951 Presence of aortocoronary bypass graft: Secondary | ICD-10-CM

## 2017-10-13 HISTORY — PX: CORONARY ARTERY BYPASS GRAFT: SHX141

## 2017-10-13 HISTORY — PX: TEE WITHOUT CARDIOVERSION: SHX5443

## 2017-10-13 HISTORY — DX: Presence of aortocoronary bypass graft: Z95.1

## 2017-10-13 LAB — POCT I-STAT, CHEM 8
BUN: 11 mg/dL (ref 6–20)
BUN: 10 mg/dL (ref 6–20)
BUN: 11 mg/dL (ref 6–20)
BUN: 11 mg/dL (ref 6–20)
BUN: 10 mg/dL (ref 6–20)
BUN: 10 mg/dL (ref 6–20)
CALCIUM ION: 1.11 mmol/L — AB (ref 1.15–1.40)
CALCIUM ION: 1.15 mmol/L (ref 1.15–1.40)
CHLORIDE: 103 mmol/L (ref 101–111)
CHLORIDE: 100 mmol/L — AB (ref 101–111)
CREATININE: 0.9 mg/dL (ref 0.61–1.24)
Calcium, Ion: 1.2 mmol/L (ref 1.15–1.40)
Calcium, Ion: 1.05 mmol/L — ABNORMAL LOW (ref 1.15–1.40)
Calcium, Ion: 1.23 mmol/L (ref 1.15–1.40)
Calcium, Ion: 1.13 mmol/L — ABNORMAL LOW (ref 1.15–1.40)
Chloride: 103 mmol/L (ref 101–111)
Chloride: 100 mmol/L — ABNORMAL LOW (ref 101–111)
Chloride: 101 mmol/L (ref 101–111)
Chloride: 100 mmol/L — ABNORMAL LOW (ref 101–111)
Creatinine, Ser: 0.9 mg/dL (ref 0.61–1.24)
Creatinine, Ser: 0.9 mg/dL (ref 0.61–1.24)
Creatinine, Ser: 0.9 mg/dL (ref 0.61–1.24)
Creatinine, Ser: 0.9 mg/dL (ref 0.61–1.24)
Creatinine, Ser: 0.9 mg/dL (ref 0.61–1.24)
GLUCOSE: 108 mg/dL — AB (ref 65–99)
GLUCOSE: 174 mg/dL — AB (ref 65–99)
Glucose, Bld: 111 mg/dL — ABNORMAL HIGH (ref 65–99)
Glucose, Bld: 109 mg/dL — ABNORMAL HIGH (ref 65–99)
Glucose, Bld: 154 mg/dL — ABNORMAL HIGH (ref 65–99)
Glucose, Bld: 168 mg/dL — ABNORMAL HIGH (ref 65–99)
HCT: 31 % — ABNORMAL LOW (ref 39.0–52.0)
HCT: 35 % — ABNORMAL LOW (ref 39.0–52.0)
HEMATOCRIT: 40 % (ref 39.0–52.0)
HEMATOCRIT: 30 % — AB (ref 39.0–52.0)
HEMATOCRIT: 35 % — AB (ref 39.0–52.0)
HEMATOCRIT: 31 % — AB (ref 39.0–52.0)
HEMOGLOBIN: 10.2 g/dL — AB (ref 13.0–17.0)
HEMOGLOBIN: 11.9 g/dL — AB (ref 13.0–17.0)
HEMOGLOBIN: 10.5 g/dL — AB (ref 13.0–17.0)
HEMOGLOBIN: 10.5 g/dL — AB (ref 13.0–17.0)
HEMOGLOBIN: 11.9 g/dL — AB (ref 13.0–17.0)
Hemoglobin: 13.6 g/dL (ref 13.0–17.0)
POTASSIUM: 4.4 mmol/L (ref 3.5–5.1)
POTASSIUM: 4.6 mmol/L (ref 3.5–5.1)
Potassium: 4 mmol/L (ref 3.5–5.1)
Potassium: 5.4 mmol/L — ABNORMAL HIGH (ref 3.5–5.1)
Potassium: 4.9 mmol/L (ref 3.5–5.1)
Potassium: 3.9 mmol/L (ref 3.5–5.1)
SODIUM: 138 mmol/L (ref 135–145)
SODIUM: 136 mmol/L (ref 135–145)
SODIUM: 132 mmol/L — AB (ref 135–145)
SODIUM: 134 mmol/L — AB (ref 135–145)
Sodium: 136 mmol/L (ref 135–145)
Sodium: 138 mmol/L (ref 135–145)
TCO2: 26 mmol/L (ref 22–32)
TCO2: 27 mmol/L (ref 22–32)
TCO2: 27 mmol/L (ref 22–32)
TCO2: 25 mmol/L (ref 22–32)
TCO2: 25 mmol/L (ref 22–32)
TCO2: 24 mmol/L (ref 22–32)

## 2017-10-13 LAB — CBC
HCT: 36.7 % — ABNORMAL LOW (ref 39.0–52.0)
HEMATOCRIT: 41.9 % (ref 39.0–52.0)
HEMOGLOBIN: 12.5 g/dL — AB (ref 13.0–17.0)
Hemoglobin: 14.3 g/dL (ref 13.0–17.0)
MCH: 30.5 pg (ref 26.0–34.0)
MCH: 30.5 pg (ref 26.0–34.0)
MCHC: 34.1 g/dL (ref 30.0–36.0)
MCHC: 34.1 g/dL (ref 30.0–36.0)
MCV: 89.3 fL (ref 78.0–100.0)
MCV: 89.5 fL (ref 78.0–100.0)
Platelets: 170 10*3/uL (ref 150–400)
Platelets: 121 10*3/uL — ABNORMAL LOW (ref 150–400)
RBC: 4.69 MIL/uL (ref 4.22–5.81)
RBC: 4.1 MIL/uL — AB (ref 4.22–5.81)
RDW: 13.7 % (ref 11.5–15.5)
RDW: 13.9 % (ref 11.5–15.5)
WBC: 5.4 10*3/uL (ref 4.0–10.5)
WBC: 9.1 10*3/uL (ref 4.0–10.5)

## 2017-10-13 LAB — BASIC METABOLIC PANEL
Anion gap: 8 (ref 5–15)
BUN: 11 mg/dL (ref 6–20)
CHLORIDE: 102 mmol/L (ref 101–111)
CO2: 26 mmol/L (ref 22–32)
Calcium: 9.2 mg/dL (ref 8.9–10.3)
Creatinine, Ser: 1.23 mg/dL (ref 0.61–1.24)
GFR calc Af Amer: >60 mL/min (ref >60)
GFR calc non Af Amer: >60 mL/min (ref >60)
Glucose, Bld: 101 mg/dL — ABNORMAL HIGH (ref 65–99)
POTASSIUM: 3.9 mmol/L (ref 3.5–5.1)
SODIUM: 136 mmol/L (ref 135–145)

## 2017-10-13 LAB — COMPREHENSIVE METABOLIC PANEL
ALT: 20 U/L (ref 17–63)
AST: 46 U/L — ABNORMAL HIGH (ref 15–41)
Albumin: 3.7 g/dL (ref 3.5–5.0)
Alkaline Phosphatase: 59 U/L (ref 38–126)
Anion gap: 8 (ref 5–15)
BUN: 10 mg/dL (ref 6–20)
CO2: 22 mmol/L (ref 22–32)
Calcium: 8.2 mg/dL — ABNORMAL LOW (ref 8.9–10.3)
Chloride: 102 mmol/L (ref 101–111)
Creatinine, Ser: 1.08 mg/dL (ref 0.61–1.24)
GFR calc Af Amer: >60 mL/min (ref >60)
GFR calc non Af Amer: >60 mL/min (ref >60)
Glucose, Bld: 168 mg/dL — ABNORMAL HIGH (ref 65–99)
Potassium: 3.9 mmol/L (ref 3.5–5.1)
Sodium: 132 mmol/L — ABNORMAL LOW (ref 135–145)
Total Bilirubin: 0.9 mg/dL (ref 0.3–1.2)
Total Protein: 6.2 g/dL — ABNORMAL LOW (ref 6.5–8.1)

## 2017-10-13 LAB — CBC WITH DIFFERENTIAL/PLATELET
Basophils Absolute: 0 10*3/uL (ref 0.0–0.1)
Basophils Relative: 0 %
Eosinophils Absolute: 0 10*3/uL (ref 0.0–0.7)
Eosinophils Relative: 0 %
HCT: 37.4 % — ABNORMAL LOW (ref 39.0–52.0)
Hemoglobin: 13 g/dL (ref 13.0–17.0)
Lymphocytes Relative: 9 %
Lymphs Abs: 1.2 10*3/uL (ref 0.7–4.0)
MCH: 30.7 pg (ref 26.0–34.0)
MCHC: 34.8 g/dL (ref 30.0–36.0)
MCV: 88.2 fL (ref 78.0–100.0)
Monocytes Absolute: 0.6 10*3/uL (ref 0.1–1.0)
Monocytes Relative: 4 %
Neutro Abs: 12.2 10*3/uL — ABNORMAL HIGH (ref 1.7–7.7)
Neutrophils Relative %: 87 %
Platelets: 151 10*3/uL (ref 150–400)
RBC: 4.24 MIL/uL (ref 4.22–5.81)
RDW: 13.6 % (ref 11.5–15.5)
WBC: 14 10*3/uL — ABNORMAL HIGH (ref 4.0–10.5)

## 2017-10-13 LAB — POCT I-STAT 3, ART BLOOD GAS (G3+)
ACID-BASE DEFICIT: 5 mmol/L — AB (ref 0.0–2.0)
ACID-BASE EXCESS: 3 mmol/L — AB (ref 0.0–2.0)
Acid-base deficit: 3 mmol/L — ABNORMAL HIGH (ref 0.0–2.0)
Acid-base deficit: 3 mmol/L — ABNORMAL HIGH (ref 0.0–2.0)
BICARBONATE: 20.9 mmol/L (ref 20.0–28.0)
Bicarbonate: 28 mmol/L (ref 20.0–28.0)
Bicarbonate: 22.7 mmol/L (ref 20.0–28.0)
Bicarbonate: 22.8 mmol/L (ref 20.0–28.0)
O2 SAT: 100 %
O2 Saturation: 96 %
O2 Saturation: 95 %
O2 Saturation: 95 %
PH ART: 7.335 — AB (ref 7.350–7.450)
PH ART: 7.344 — AB (ref 7.350–7.450)
PO2 ART: 78 mmHg — AB (ref 83.0–108.0)
Patient temperature: 35.3
TCO2: 29 mmol/L (ref 22–32)
TCO2: 24 mmol/L (ref 22–32)
TCO2: 24 mmol/L (ref 22–32)
TCO2: 22 mmol/L (ref 22–32)
pCO2 arterial: 45.2 mmHg (ref 32.0–48.0)
pCO2 arterial: 39.9 mmHg (ref 32.0–48.0)
pCO2 arterial: 42.5 mmHg (ref 32.0–48.0)
pCO2 arterial: 38 mmHg (ref 32.0–48.0)
pH, Arterial: 7.4 (ref 7.350–7.450)
pH, Arterial: 7.355 (ref 7.350–7.450)
pO2, Arterial: 322 mmHg — ABNORMAL HIGH (ref 83.0–108.0)
pO2, Arterial: 77 mmHg — ABNORMAL LOW (ref 83.0–108.0)
pO2, Arterial: 78 mmHg — ABNORMAL LOW (ref 83.0–108.0)

## 2017-10-13 LAB — GLUCOSE, CAPILLARY
GLUCOSE-CAPILLARY: 139 mg/dL — AB (ref 65–99)
GLUCOSE-CAPILLARY: 141 mg/dL — AB (ref 65–99)
Glucose-Capillary: 127 mg/dL — ABNORMAL HIGH (ref 65–99)
Glucose-Capillary: 130 mg/dL — ABNORMAL HIGH (ref 65–99)
Glucose-Capillary: 142 mg/dL — ABNORMAL HIGH (ref 65–99)
Glucose-Capillary: 171 mg/dL — ABNORMAL HIGH (ref 65–99)
Glucose-Capillary: 149 mg/dL — ABNORMAL HIGH (ref 65–99)

## 2017-10-13 LAB — HEMOGLOBIN A1C
Hgb A1c MFr Bld: 5.9 % — ABNORMAL HIGH (ref 4.8–5.6)
Hgb A1c MFr Bld: 5.7 % — ABNORMAL HIGH (ref 4.8–5.6)
Mean Plasma Glucose: 123 mg/dL
Mean Plasma Glucose: 117 mg/dL

## 2017-10-13 LAB — HEPATIC FUNCTION PANEL
ALT: 18 U/L (ref 17–63)
AST: 38 U/L (ref 15–41)
Albumin: 2.9 g/dL — ABNORMAL LOW (ref 3.5–5.0)
Alkaline Phosphatase: 54 U/L (ref 38–126)
Bilirubin, Direct: 0.3 mg/dL (ref 0.1–0.5)
Indirect Bilirubin: 0.4 mg/dL (ref 0.3–0.9)
Total Bilirubin: 0.7 mg/dL (ref 0.3–1.2)
Total Protein: 5.2 g/dL — ABNORMAL LOW (ref 6.5–8.1)

## 2017-10-13 LAB — PROTIME-INR
INR: 1.28
PROTHROMBIN TIME: 15.9 s — AB (ref 11.4–15.2)

## 2017-10-13 LAB — HEMOGLOBIN AND HEMATOCRIT, BLOOD
HCT: 30.4 % — ABNORMAL LOW (ref 39.0–52.0)
Hemoglobin: 10.3 g/dL — ABNORMAL LOW (ref 13.0–17.0)

## 2017-10-13 LAB — APTT: aPTT: 33 seconds (ref 24–36)

## 2017-10-13 LAB — MAGNESIUM: Magnesium: 2.6 mg/dL — ABNORMAL HIGH (ref 1.7–2.4)

## 2017-10-13 LAB — POCT I-STAT 4, (NA,K, GLUC, HGB,HCT)
Glucose, Bld: 121 mg/dL — ABNORMAL HIGH (ref 65–99)
HCT: 36 % — ABNORMAL LOW (ref 39.0–52.0)
Hemoglobin: 12.2 g/dL — ABNORMAL LOW (ref 13.0–17.0)
Potassium: 4.2 mmol/L (ref 3.5–5.1)
Sodium: 139 mmol/L (ref 135–145)

## 2017-10-13 LAB — PHOSPHORUS: Phosphorus: 3.3 mg/dL (ref 2.5–4.6)

## 2017-10-13 LAB — PLATELET COUNT: Platelets: 126 10*3/uL — ABNORMAL LOW (ref 150–400)

## 2017-10-13 LAB — HEPARIN LEVEL (UNFRACTIONATED): Heparin Unfractionated: 0.45 IU/mL (ref 0.30–0.70)

## 2017-10-13 LAB — TSH: TSH: 3.72 u[IU]/mL (ref 0.350–4.500)

## 2017-10-13 SURGERY — CORONARY ARTERY BYPASS GRAFTING (CABG)
Anesthesia: General | Site: Chest

## 2017-10-13 MED ORDER — MORPHINE SULFATE (PF) 2 MG/ML IV SOLN: 1.0000 mg | INTRAVENOUS | Status: DC | PRN

## 2017-10-13 MED ORDER — ACETAMINOPHEN 650 MG RE SUPP
650.0000 mg | Freq: Once | RECTAL | Status: AC
  Administered 2017-10-13: 650 mg via RECTAL

## 2017-10-13 MED ORDER — 0.9 % SODIUM CHLORIDE (POUR BTL) OPTIME
TOPICAL | Status: DC | PRN
  Administered 2017-10-13: 6000 mL

## 2017-10-13 MED ORDER — PROTAMINE SULFATE 10 MG/ML IV SOLN
INTRAVENOUS | Status: DC | PRN
  Administered 2017-10-13: 350 mg via INTRAVENOUS

## 2017-10-13 MED ORDER — ORAL CARE MOUTH RINSE
15.0000 mL | Freq: Four times a day (QID) | OROMUCOSAL | Status: DC
  Administered 2017-10-13: 15 mL via OROMUCOSAL

## 2017-10-13 MED ORDER — HEMOSTATIC AGENTS (NO CHARGE) OPTIME
TOPICAL | Status: DC | PRN
  Administered 2017-10-13: 1 via TOPICAL

## 2017-10-13 MED ORDER — FENTANYL CITRATE (PF) 250 MCG/5ML IJ SOLN
INTRAMUSCULAR | Status: AC
  Filled 2017-10-13: qty 5

## 2017-10-13 MED ORDER — TRAMADOL HCL 50 MG PO TABS
50.0000 mg | ORAL_TABLET | ORAL | Status: DC | PRN
  Administered 2017-10-14: 100 mg via ORAL
  Administered 2017-10-14: 50 mg via ORAL
  Administered 2017-10-15 – 2017-10-16 (×4): 100 mg via ORAL
  Filled 2017-10-13 (×2): qty 2
  Filled 2017-10-13: qty 1
  Filled 2017-10-13 (×3): qty 2

## 2017-10-13 MED ORDER — CHLORHEXIDINE GLUCONATE 0.12 % MT SOLN
15.0000 mL | OROMUCOSAL | Status: AC
  Administered 2017-10-13: 15 mL via OROMUCOSAL

## 2017-10-13 MED ORDER — LACTATED RINGERS IV SOLN: INTRAVENOUS | Status: DC

## 2017-10-13 MED ORDER — LIDOCAINE 2% (20 MG/ML) 5 ML SYRINGE
INTRAMUSCULAR | Status: DC | PRN
  Administered 2017-10-13: 60 mg via INTRAVENOUS

## 2017-10-13 MED ORDER — SUCCINYLCHOLINE CHLORIDE 200 MG/10ML IV SOSY
PREFILLED_SYRINGE | INTRAVENOUS | Status: AC
  Filled 2017-10-13: qty 10

## 2017-10-13 MED ORDER — VANCOMYCIN HCL IN DEXTROSE 1-5 GM/200ML-% IV SOLN
1000.0000 mg | Freq: Once | INTRAVENOUS | Status: AC
  Administered 2017-10-13: 1000 mg via INTRAVENOUS
  Filled 2017-10-13: qty 200

## 2017-10-13 MED ORDER — EZETIMIBE 10 MG PO TABS
10.0000 mg | ORAL_TABLET | Freq: Every day | ORAL | Status: DC
  Administered 2017-10-14 – 2017-10-16 (×3): 10 mg via ORAL
  Filled 2017-10-13 (×3): qty 1

## 2017-10-13 MED ORDER — LACTATED RINGERS IV SOLN
INTRAVENOUS | Status: DC | PRN
  Administered 2017-10-13 (×2): via INTRAVENOUS

## 2017-10-13 MED ORDER — EPHEDRINE SULFATE 50 MG/ML IJ SOLN
INTRAMUSCULAR | Status: DC | PRN
  Administered 2017-10-13: 10 mg via INTRAVENOUS

## 2017-10-13 MED ORDER — ROCURONIUM BROMIDE 10 MG/ML (PF) SYRINGE
PREFILLED_SYRINGE | INTRAVENOUS | Status: AC
  Filled 2017-10-13: qty 5

## 2017-10-13 MED ORDER — PROPOFOL 10 MG/ML IV BOLUS
INTRAVENOUS | Status: DC | PRN
  Administered 2017-10-13: 120 mg via INTRAVENOUS

## 2017-10-13 MED ORDER — ROCURONIUM BROMIDE 10 MG/ML (PF) SYRINGE
PREFILLED_SYRINGE | INTRAVENOUS | Status: DC | PRN
  Administered 2017-10-13: 50 mg via INTRAVENOUS
  Administered 2017-10-13 (×2): 60 mg via INTRAVENOUS
  Administered 2017-10-13: 50 mg via INTRAVENOUS
  Administered 2017-10-13: 40 mg via INTRAVENOUS

## 2017-10-13 MED ORDER — HEPARIN SODIUM (PORCINE) 1000 UNIT/ML IJ SOLN
INTRAMUSCULAR | Status: AC
  Filled 2017-10-13: qty 1

## 2017-10-13 MED ORDER — DOCUSATE SODIUM 100 MG PO CAPS
200.0000 mg | ORAL_CAPSULE | Freq: Every day | ORAL | Status: DC
  Administered 2017-10-14 – 2017-10-16 (×3): 200 mg via ORAL
  Filled 2017-10-13 (×3): qty 2

## 2017-10-13 MED ORDER — MIDAZOLAM HCL 5 MG/5ML IJ SOLN
INTRAMUSCULAR | Status: DC | PRN
Start: 2017-10-13 — End: 2017-10-13
  Administered 2017-10-13: 2 mg via INTRAVENOUS
  Administered 2017-10-13: 3 mg via INTRAVENOUS
  Administered 2017-10-13: 2 mg via INTRAVENOUS
  Administered 2017-10-13: 3 mg via INTRAVENOUS

## 2017-10-13 MED ORDER — HEPARIN SODIUM (PORCINE) 1000 UNIT/ML IJ SOLN
INTRAMUSCULAR | Status: DC | PRN
  Administered 2017-10-13: 36000 [IU] via INTRAVENOUS

## 2017-10-13 MED ORDER — FENTANYL CITRATE (PF) 100 MCG/2ML IJ SOLN
25.0000 ug | INTRAMUSCULAR | Status: DC | PRN
  Administered 2017-10-14: 25 ug via INTRAVENOUS
  Filled 2017-10-13: qty 2

## 2017-10-13 MED ORDER — ALBUMIN HUMAN 5 % IV SOLN
250.0000 mL | INTRAVENOUS | Status: AC | PRN
  Administered 2017-10-13 (×3): 250 mL via INTRAVENOUS
  Filled 2017-10-13: qty 250

## 2017-10-13 MED ORDER — LACTATED RINGERS IV SOLN
500.0000 mL | Freq: Once | INTRAVENOUS | Status: AC | PRN
  Administered 2017-10-13: 500 mL via INTRAVENOUS

## 2017-10-13 MED ORDER — ACETAMINOPHEN 160 MG/5ML PO SOLN: 1000.0000 mg | Freq: Four times a day (QID) | ORAL | Status: DC

## 2017-10-13 MED ORDER — BISACODYL 5 MG PO TBEC
10.0000 mg | DELAYED_RELEASE_TABLET | Freq: Every day | ORAL | Status: DC
  Administered 2017-10-14 – 2017-10-16 (×3): 10 mg via ORAL
  Filled 2017-10-13 (×3): qty 2

## 2017-10-13 MED ORDER — SODIUM CHLORIDE 0.9 % IV SOLN
INTRAVENOUS | Status: DC
  Administered 2017-10-14: 07:00:00 via INTRAVENOUS
  Filled 2017-10-13 (×2): qty 1

## 2017-10-13 MED ORDER — MIDAZOLAM HCL 2 MG/2ML IJ SOLN: 2.0000 mg | INTRAMUSCULAR | Status: DC | PRN

## 2017-10-13 MED ORDER — ARTIFICIAL TEARS OPHTHALMIC OINT
TOPICAL_OINTMENT | OPHTHALMIC | Status: DC | PRN
Start: 2017-10-13 — End: 2017-10-13
  Administered 2017-10-13: 1 via OPHTHALMIC

## 2017-10-13 MED ORDER — THROMBIN (RECOMBINANT) 5000 UNITS EX SOLR
CUTANEOUS | Status: AC
  Filled 2017-10-13: qty 5000

## 2017-10-13 MED ORDER — ORAL CARE MOUTH RINSE
15.0000 mL | Freq: Two times a day (BID) | OROMUCOSAL | Status: DC
  Administered 2017-10-15: 15 mL via OROMUCOSAL

## 2017-10-13 MED ORDER — POTASSIUM CHLORIDE 10 MEQ/50ML IV SOLN: 10.0000 meq | INTRAVENOUS | Status: AC

## 2017-10-13 MED ORDER — SODIUM CHLORIDE 0.9 % IJ SOLN
OROMUCOSAL | Status: DC | PRN
  Administered 2017-10-13 (×3): via TOPICAL

## 2017-10-13 MED ORDER — AMIODARONE HCL IN DEXTROSE 360-4.14 MG/200ML-% IV SOLN
60.0000 mg/h | INTRAVENOUS | Status: DC
  Filled 2017-10-13 (×3): qty 200

## 2017-10-13 MED ORDER — ACETAMINOPHEN 500 MG PO TABS
1000.0000 mg | ORAL_TABLET | Freq: Four times a day (QID) | ORAL | Status: DC
  Administered 2017-10-14 – 2017-10-17 (×13): 1000 mg via ORAL
  Filled 2017-10-13 (×14): qty 2

## 2017-10-13 MED ORDER — METOPROLOL TARTRATE 5 MG/5ML IV SOLN: 2.5000 mg | INTRAVENOUS | Status: DC | PRN

## 2017-10-13 MED ORDER — ACETAMINOPHEN 160 MG/5ML PO SOLN: 650.0000 mg | Freq: Once | ORAL | Status: AC

## 2017-10-13 MED ORDER — METOPROLOL TARTRATE 12.5 MG HALF TABLET
12.5000 mg | ORAL_TABLET | Freq: Two times a day (BID) | ORAL | Status: DC
  Administered 2017-10-14 – 2017-10-17 (×7): 12.5 mg via ORAL
  Filled 2017-10-13 (×8): qty 1

## 2017-10-13 MED ORDER — MIDAZOLAM HCL 10 MG/2ML IJ SOLN
INTRAMUSCULAR | Status: AC
  Filled 2017-10-13: qty 2

## 2017-10-13 MED ORDER — AMIODARONE HCL IN DEXTROSE 360-4.14 MG/200ML-% IV SOLN
30.0000 mg/h | INTRAVENOUS | Status: DC
  Administered 2017-10-13: 30 mg/h via INTRAVENOUS

## 2017-10-13 MED ORDER — METOPROLOL TARTRATE 25 MG/10 ML ORAL SUSPENSION: 12.5000 mg | Freq: Two times a day (BID) | ORAL | Status: DC

## 2017-10-13 MED ORDER — ONDANSETRON HCL 4 MG/2ML IJ SOLN
4.0000 mg | Freq: Four times a day (QID) | INTRAMUSCULAR | Status: DC | PRN
  Administered 2017-10-13 – 2017-10-14 (×2): 4 mg via INTRAVENOUS
  Filled 2017-10-13 (×2): qty 2

## 2017-10-13 MED ORDER — SODIUM CHLORIDE 0.9% FLUSH: 3.0000 mL | INTRAVENOUS | Status: DC | PRN

## 2017-10-13 MED ORDER — OXYCODONE HCL 5 MG PO TABS
5.0000 mg | ORAL_TABLET | ORAL | Status: DC | PRN
  Administered 2017-10-14 (×3): 5 mg via ORAL
  Administered 2017-10-15 (×2): 10 mg via ORAL
  Filled 2017-10-13: qty 1
  Filled 2017-10-13: qty 2
  Filled 2017-10-13: qty 1
  Filled 2017-10-13: qty 2
  Filled 2017-10-13: qty 1

## 2017-10-13 MED ORDER — PROPOFOL 10 MG/ML IV BOLUS
INTRAVENOUS | Status: AC
  Filled 2017-10-13: qty 20

## 2017-10-13 MED ORDER — DEXTROSE 5 % IV SOLN
1.5000 g | Freq: Two times a day (BID) | INTRAVENOUS | Status: AC
  Administered 2017-10-13 – 2017-10-15 (×4): 1.5 g via INTRAVENOUS
  Filled 2017-10-13 (×4): qty 1.5

## 2017-10-13 MED ORDER — FENTANYL CITRATE (PF) 250 MCG/5ML IJ SOLN
INTRAMUSCULAR | Status: DC | PRN
  Administered 2017-10-13: 250 ug via INTRAVENOUS
  Administered 2017-10-13 (×3): 100 ug via INTRAVENOUS
  Administered 2017-10-13: 50 ug via INTRAVENOUS
  Administered 2017-10-13: 500 ug via INTRAVENOUS
  Administered 2017-10-13 (×2): 250 ug via INTRAVENOUS
  Administered 2017-10-13: 200 ug via INTRAVENOUS
  Administered 2017-10-13: 150 ug via INTRAVENOUS
  Administered 2017-10-13: 50 ug via INTRAVENOUS

## 2017-10-13 MED ORDER — ASPIRIN EC 325 MG PO TBEC
325.0000 mg | DELAYED_RELEASE_TABLET | Freq: Every day | ORAL | Status: DC
  Administered 2017-10-14 – 2017-10-15 (×2): 325 mg via ORAL
  Filled 2017-10-13 (×3): qty 1

## 2017-10-13 MED ORDER — SODIUM CHLORIDE 0.9 % IV SOLN: INTRAVENOUS | Status: DC

## 2017-10-13 MED ORDER — AMIODARONE HCL IN DEXTROSE 360-4.14 MG/200ML-% IV SOLN
30.0000 mg/h | INTRAVENOUS | Status: DC
  Administered 2017-10-13: 60 mg/h via INTRAVENOUS
  Filled 2017-10-13 (×2): qty 200

## 2017-10-13 MED ORDER — LIDOCAINE 2% (20 MG/ML) 5 ML SYRINGE
INTRAMUSCULAR | Status: AC
  Filled 2017-10-13: qty 5

## 2017-10-13 MED ORDER — MAGNESIUM SULFATE 4 GM/100ML IV SOLN
4.0000 g | Freq: Once | INTRAVENOUS | Status: AC
  Administered 2017-10-13: 4 g via INTRAVENOUS
  Filled 2017-10-13: qty 100

## 2017-10-13 MED ORDER — ASPIRIN 81 MG PO CHEW: 324.0000 mg | CHEWABLE_TABLET | Freq: Every day | ORAL | Status: DC

## 2017-10-13 MED ORDER — CHLORHEXIDINE GLUCONATE 0.12% ORAL RINSE (MEDLINE KIT)
15.0000 mL | Freq: Two times a day (BID) | OROMUCOSAL | Status: DC
  Administered 2017-10-13: 15 mL via OROMUCOSAL

## 2017-10-13 MED ORDER — SODIUM CHLORIDE 0.45 % IV SOLN: INTRAVENOUS | Status: DC | PRN

## 2017-10-13 MED ORDER — SODIUM CHLORIDE 0.9% FLUSH
3.0000 mL | Freq: Two times a day (BID) | INTRAVENOUS | Status: DC
  Administered 2017-10-14 – 2017-10-16 (×4): 3 mL via INTRAVENOUS

## 2017-10-13 MED ORDER — SUCCINYLCHOLINE CHLORIDE 20 MG/ML IJ SOLN
INTRAMUSCULAR | Status: DC | PRN
  Administered 2017-10-13: 120 mg via INTRAVENOUS

## 2017-10-13 MED ORDER — EPHEDRINE 5 MG/ML INJ
INTRAVENOUS | Status: AC
  Filled 2017-10-13: qty 10

## 2017-10-13 MED ORDER — NITROGLYCERIN IN D5W 200-5 MCG/ML-% IV SOLN: 0.0000 ug/min | INTRAVENOUS | Status: DC

## 2017-10-13 MED ORDER — THROMBIN (RECOMBINANT) 5000 UNITS EX SOLR
CUTANEOUS | Status: DC | PRN
  Administered 2017-10-13: 5000 [IU] via TOPICAL

## 2017-10-13 MED ORDER — FAMOTIDINE IN NACL 20-0.9 MG/50ML-% IV SOLN
20.0000 mg | Freq: Two times a day (BID) | INTRAVENOUS | Status: AC
  Administered 2017-10-14: 20 mg via INTRAVENOUS
  Filled 2017-10-13 (×2): qty 50

## 2017-10-13 MED ORDER — SODIUM CHLORIDE 0.9 % IV SOLN
0.0000 ug/kg/h | INTRAVENOUS | Status: DC
  Administered 2017-10-13: 0.5 ug/kg/h via INTRAVENOUS
  Filled 2017-10-13 (×2): qty 2

## 2017-10-13 MED ORDER — ALBUMIN HUMAN 5 % IV SOLN
INTRAVENOUS | Status: DC | PRN
  Administered 2017-10-13: 12:00:00 via INTRAVENOUS

## 2017-10-13 MED ORDER — ROCURONIUM BROMIDE 10 MG/ML (PF) SYRINGE
PREFILLED_SYRINGE | INTRAVENOUS | Status: AC
  Filled 2017-10-13: qty 10

## 2017-10-13 MED ORDER — INSULIN REGULAR BOLUS VIA INFUSION
0.0000 [IU] | Freq: Three times a day (TID) | INTRAVENOUS | Status: DC
  Filled 2017-10-13: qty 10

## 2017-10-13 MED ORDER — LACTATED RINGERS IV SOLN
INTRAVENOUS | Status: DC | PRN
  Administered 2017-10-13: 07:00:00 via INTRAVENOUS

## 2017-10-13 MED ORDER — FENTANYL CITRATE (PF) 250 MCG/5ML IJ SOLN
INTRAMUSCULAR | Status: AC
  Filled 2017-10-13: qty 25

## 2017-10-13 MED ORDER — SODIUM CHLORIDE 0.9 % IV SOLN
0.0000 ug/min | INTRAVENOUS | Status: DC
  Filled 2017-10-13: qty 2

## 2017-10-13 MED ORDER — LACTATED RINGERS IV SOLN
INTRAVENOUS | Status: DC | PRN
  Administered 2017-10-13: 06:00:00 via INTRAVENOUS

## 2017-10-13 MED ORDER — BISACODYL 10 MG RE SUPP: 10.0000 mg | Freq: Every day | RECTAL | Status: DC

## 2017-10-13 MED ORDER — SODIUM CHLORIDE 0.9 % IV SOLN: 250.0000 mL | INTRAVENOUS | Status: DC

## 2017-10-13 MED ORDER — PANTOPRAZOLE SODIUM 40 MG PO TBEC
40.0000 mg | DELAYED_RELEASE_TABLET | Freq: Every day | ORAL | Status: DC
  Administered 2017-10-15 – 2017-10-17 (×3): 40 mg via ORAL
  Filled 2017-10-13 (×3): qty 1

## 2017-10-13 MED ORDER — MORPHINE SULFATE (PF) 4 MG/ML IV SOLN
1.0000 mg | INTRAVENOUS | Status: AC | PRN
  Administered 2017-10-13: 4 mg via INTRAVENOUS
  Administered 2017-10-13: 2 mg via INTRAVENOUS
  Administered 2017-10-14 (×2): 4 mg via INTRAVENOUS
  Filled 2017-10-13 (×5): qty 1

## 2017-10-13 MED ORDER — AMIODARONE HCL IN DEXTROSE 360-4.14 MG/200ML-% IV SOLN
60.0000 mg/h | INTRAVENOUS | Status: DC
  Filled 2017-10-13: qty 200

## 2017-10-13 MED FILL — Heparin Sodium (Porcine) Inj 1000 Unit/ML: INTRAMUSCULAR | Qty: 20 | Status: AC

## 2017-10-13 MED FILL — Lidocaine HCl IV Inj 20 MG/ML: INTRAVENOUS | Qty: 5 | Status: AC

## 2017-10-13 MED FILL — Electrolyte-R (PH 7.4) Solution: INTRAVENOUS | Qty: 3000 | Status: AC

## 2017-10-13 MED FILL — Sodium Bicarbonate IV Soln 8.4%: INTRAVENOUS | Qty: 50 | Status: AC

## 2017-10-13 MED FILL — Mannitol IV Soln 20%: INTRAVENOUS | Qty: 500 | Status: AC

## 2017-10-13 MED FILL — Sodium Chloride IV Soln 0.9%: INTRAVENOUS | Qty: 2000 | Status: AC

## 2017-10-13 SURGICAL SUPPLY — 84 items
BAG DECANTER FOR FLEXI CONT (MISCELLANEOUS) ×3 IMPLANT
BANDAGE ACE 4X5 VEL STRL LF (GAUZE/BANDAGES/DRESSINGS) ×3 IMPLANT
BANDAGE ACE 6X5 VEL STRL LF (GAUZE/BANDAGES/DRESSINGS) ×3 IMPLANT
BLADE NEEDLE 3 SS STRL (BLADE) ×3 IMPLANT
BLADE STERNUM SYSTEM 6 (BLADE) ×3 IMPLANT
BNDG GAUZE ELAST 4 BULKY (GAUZE/BANDAGES/DRESSINGS) ×3 IMPLANT
CANISTER SUCT 3000ML PPV (MISCELLANEOUS) ×3 IMPLANT
CANNULA ARTERIAL NVNT 3/8 22FR (MISCELLANEOUS) ×3 IMPLANT
CATH CPB KIT GERHARDT (MISCELLANEOUS) ×3 IMPLANT
CATH THORACIC 28FR (CATHETERS) ×3 IMPLANT
CRADLE DONUT ADULT HEAD (MISCELLANEOUS) ×3 IMPLANT
DRAIN CHANNEL 28F RND 3/8 FF (WOUND CARE) ×3 IMPLANT
DRAPE CARDIOVASCULAR INCISE (DRAPES) ×1
DRAPE SLUSH/WARMER DISC (DRAPES) ×3 IMPLANT
DRAPE SRG 135X102X78XABS (DRAPES) ×2 IMPLANT
DRSG AQUACEL AG ADV 3.5X14 (GAUZE/BANDAGES/DRESSINGS) ×3 IMPLANT
ELECT BLADE 4.0 EZ CLEAN MEGAD (MISCELLANEOUS) ×3
ELECT REM PT RETURN 9FT ADLT (ELECTROSURGICAL) ×6
ELECTRODE BLDE 4.0 EZ CLN MEGD (MISCELLANEOUS) ×2 IMPLANT
ELECTRODE REM PT RTRN 9FT ADLT (ELECTROSURGICAL) ×4 IMPLANT
FELT TEFLON 1X6 (MISCELLANEOUS) ×3 IMPLANT
GAUZE SPONGE 4X4 12PLY STRL (GAUZE/BANDAGES/DRESSINGS) ×6 IMPLANT
GAUZE SPONGE 4X4 12PLY STRL LF (GAUZE/BANDAGES/DRESSINGS) ×6 IMPLANT
GLOVE BIO SURGEON STRL SZ 6.5 (GLOVE) ×9 IMPLANT
GOWN STRL REUS W/ TWL LRG LVL3 (GOWN DISPOSABLE) ×16 IMPLANT
GOWN STRL REUS W/TWL LRG LVL3 (GOWN DISPOSABLE) ×8
HEMOSTAT POWDER SURGIFOAM 1G (HEMOSTASIS) ×9 IMPLANT
HEMOSTAT SURGICEL 2X14 (HEMOSTASIS) ×3 IMPLANT
KIT BASIN OR (CUSTOM PROCEDURE TRAY) ×3 IMPLANT
KIT CATH SUCT 8FR (CATHETERS) ×3 IMPLANT
KIT ROOM TURNOVER OR (KITS) ×3 IMPLANT
KIT SUCTION CATH 14FR (SUCTIONS) ×6 IMPLANT
KIT VASOVIEW HEMOPRO VH 3000 (KITS) ×3 IMPLANT
LEAD PACING MYOCARDI (MISCELLANEOUS) ×3 IMPLANT
MARKER GRAFT CORONARY BYPASS (MISCELLANEOUS) ×9 IMPLANT
NS IRRIG 1000ML POUR BTL (IV SOLUTION) ×18 IMPLANT
PACK OPEN HEART (CUSTOM PROCEDURE TRAY) ×3 IMPLANT
PAD ARMBOARD 7.5X6 YLW CONV (MISCELLANEOUS) ×6 IMPLANT
PAD ELECT DEFIB RADIOL ZOLL (MISCELLANEOUS) ×3 IMPLANT
PENCIL BUTTON HOLSTER BLD 10FT (ELECTRODE) ×3 IMPLANT
PUNCH AORTIC ROTATE  4.5MM 8IN (MISCELLANEOUS) ×3 IMPLANT
SET CARDIOPLEGIA MPS 5001102 (MISCELLANEOUS) ×3 IMPLANT
SOLUTION ANTI FOG 6CC (MISCELLANEOUS) ×3 IMPLANT
SPOGE SURGIFLO 8M (HEMOSTASIS) ×1
SPONGE LAP 18X18 X RAY DECT (DISPOSABLE) ×6 IMPLANT
SPONGE SURGIFLO 8M (HEMOSTASIS) ×2 IMPLANT
SUT BONE WAX W31G (SUTURE) ×3 IMPLANT
SUT ETHIBOND 2 0 SH (SUTURE) ×4
SUT ETHIBOND 2 0 SH 36X2 (SUTURE) ×8 IMPLANT
SUT MNCRL AB 4-0 PS2 18 (SUTURE) ×3 IMPLANT
SUT PROLENE 3 0 SH1 36 (SUTURE) ×3 IMPLANT
SUT PROLENE 4 0 RB 1 (SUTURE) ×1
SUT PROLENE 4 0 TF (SUTURE) ×6 IMPLANT
SUT PROLENE 4-0 RB1 .5 CRCL 36 (SUTURE) ×2 IMPLANT
SUT PROLENE 5 0 C 1 36 (SUTURE) ×6 IMPLANT
SUT PROLENE 6 0 C 1 30 (SUTURE) ×12 IMPLANT
SUT PROLENE 6 0 CC (SUTURE) ×9 IMPLANT
SUT PROLENE 7 0 BV1 MDA (SUTURE) ×9 IMPLANT
SUT PROLENE 8 0 BV175 6 (SUTURE) ×9 IMPLANT
SUT SILK  1 MH (SUTURE) ×3
SUT SILK 1 MH (SUTURE) ×6 IMPLANT
SUT SILK 1 TIES 10X30 (SUTURE) ×3 IMPLANT
SUT SILK 2 0 SH CR/8 (SUTURE) ×6 IMPLANT
SUT SILK 2 0 TIES 10X30 (SUTURE) ×3 IMPLANT
SUT SILK 2 0 TIES 17X18 (SUTURE) ×1
SUT SILK 2-0 18XBRD TIE BLK (SUTURE) ×2 IMPLANT
SUT SILK 3 0 SH CR/8 (SUTURE) ×3 IMPLANT
SUT SILK 4 0 TIE 10X30 (SUTURE) ×6 IMPLANT
SUT STEEL 6MS V (SUTURE) ×3 IMPLANT
SUT STEEL SZ 6 DBL 3X14 BALL (SUTURE) ×3 IMPLANT
SUT TEM PAC WIRE 2 0 SH (SUTURE) ×12 IMPLANT
SUT VIC AB 1 CTX 18 (SUTURE) ×6 IMPLANT
SUT VIC AB 2-0 CT1 27 (SUTURE) ×1
SUT VIC AB 2-0 CT1 TAPERPNT 27 (SUTURE) ×2 IMPLANT
SUT VIC AB 2-0 CTX 27 (SUTURE) ×6 IMPLANT
SUT VIC AB 3-0 X1 27 (SUTURE) ×6 IMPLANT
SYSTEM SAHARA CHEST DRAIN ATS (WOUND CARE) ×3 IMPLANT
TAPE CLOTH SURG 4X10 WHT LF (GAUZE/BANDAGES/DRESSINGS) ×6 IMPLANT
TAPE PAPER 2X10 WHT MICROPORE (GAUZE/BANDAGES/DRESSINGS) ×3 IMPLANT
TOWEL GREEN STERILE FF (TOWEL DISPOSABLE) ×3 IMPLANT
TRAY FOLEY SILVER 16FR TEMP (SET/KITS/TRAYS/PACK) ×3 IMPLANT
TUBING INSUFFLATION (TUBING) ×3 IMPLANT
UNDERPAD 30X30 (UNDERPADS AND DIAPERS) ×3 IMPLANT
WATER STERILE IRR 1000ML POUR (IV SOLUTION) ×6 IMPLANT

## 2017-10-13 NOTE — Progress Notes (Addendum)
Spoke to Dr. Herbie BaltimoreHarding regarding order clarification on Aggrastat & Heparin gtt (pt developed hematoma to right radial cath site post TR band deflation on day shift). MD aware of right radial vascular site assessment findings. Verbal order to d/c Aggrastat (which had been held by day shift per MD instruction), & restart Heparin gtt per pharmacy guidelines. Pt & family updated. Will continue to monitor.

## 2017-10-13 NOTE — Anesthesia Procedure Notes (Signed)
Central Venous Catheter Insertion Performed by: Cecile Hearingurk, Raffaella Edison Edward, MD, anesthesiologist Start/End12/01/2017 6:35 AM, 10/13/2017 6:45 AM Patient location: Pre-op. Preanesthetic checklist: patient identified, IV checked, site marked, risks and benefits discussed, surgical consent, monitors and equipment checked, pre-op evaluation, timeout performed and anesthesia consent Position: Trendelenburg Lidocaine 1% used for infiltration and patient sedated Hand hygiene performed  and maximum sterile barriers used  Catheter size: 8.5 Fr Sheath introducer Procedure performed using ultrasound guided technique. Ultrasound Notes:anatomy identified, needle tip was noted to be adjacent to the nerve/plexus identified, no ultrasound evidence of intravascular and/or intraneural injection and image(s) printed for medical record Attempts: 1 Following insertion, line sutured, dressing applied and Biopatch. Post procedure assessment: blood return through all ports, free fluid flow and no air  Patient tolerated the procedure well with no immediate complications.

## 2017-10-13 NOTE — Progress Notes (Addendum)
Cardiac Surgery educational booklet provided to pt; reviewed & answered questions with pt & family. Will continue to monitor.

## 2017-10-13 NOTE — Transfer of Care (Signed)
Immediate Anesthesia Transfer of Care Note  Patient: Scott Rosales  Procedure(s) Performed: CORONARY ARTERY BYPASS GRAFTING (CABG) x 4, LIMA to LAD, SVG to OM1 and distal circ, SVG to PDA, using left internal mammary artery and right greater saphenous vein harvested endoscopically (N/A Chest) TRANSESOPHAGEAL ECHOCARDIOGRAM (TEE)  Patient Location: SICU  Anesthesia Type:General  Level of Consciousness: sedated and Patient remains intubated per anesthesia plan  Airway & Oxygen Therapy: Patient remains intubated per anesthesia plan and Patient placed on Ventilator (see vital sign flow sheet for setting)  Post-op Assessment: Report given to RN and Post -op Vital signs reviewed and stable  Post vital signs: Reviewed and stable  Last Vitals:  Vitals:   10/13/17 0508 10/13/17 1329  BP: (!) 136/97 123/77  Pulse: 73 80  Resp: (!) 21 13  Temp: 36.7 C   SpO2: 96% 96%    Last Pain:  Vitals:   10/13/17 0546  TempSrc:   PainSc: 0-No pain         Complications: No apparent anesthesia complications

## 2017-10-13 NOTE — Procedures (Signed)
Extubation Procedure Note  Patient Details:   Name: Scott Rosales DOB: [birth year] MRN: [id number]   Airway Documentation:     Evaluation  O2 sats: stable throughout Complications: No apparent complications Patient did tolerate procedure well. Bilateral Breath Sounds: Rhonchi   Yes   Positive cuff leak, NIF - 20, VC 1.35L.  Pt placed on Catherine 4 L with humidity, no stridor noted, able to reach 500 mL using incentive spirometer.  Rayburn FeltJean S Teresina Bugaj 10/13/2017, 7:16 PM

## 2017-10-13 NOTE — Progress Notes (Signed)
Transported to OR with family present. Bedside report given to Aggie Cosierheresa, RN, & pt left in her care. Family taken to waiting area.

## 2017-10-13 NOTE — Progress Notes (Signed)
  Amiodarone Drug - Drug Interaction Consult Note  Recommendations: No adjustments to medications based on interactions. Continue to monitor.  Amiodarone is metabolized by the cytochrome P450 system and therefore has the potential to cause many drug interactions. Amiodarone has an average plasma half-life of 50 days (range 20 to 100 days).   There is potential for drug interactions to occur several weeks or months after stopping treatment and the onset of drug interactions may be slow after initiating amiodarone.   []  Statins: Increased risk of myopathy. Simvastatin- restrict dose to 20mg  daily. Other statins: counsel patients to report any muscle pain or weakness immediately.  []  Anticoagulants: Amiodarone can increase anticoagulant effect. Consider warfarin dose reduction. Patients should be monitored closely and the dose of anticoagulant altered accordingly, remembering that amiodarone levels take several weeks to stabilize.  []  Antiepileptics: Amiodarone can increase plasma concentration of phenytoin, the dose should be reduced. Note that small changes in phenytoin dose can result in large changes in levels. Monitor patient and counsel on signs of toxicity.  [x]  Beta blockers: increased risk of bradycardia, AV block and myocardial depression. Sotalol - avoid concomitant use.  []   Calcium channel blockers (diltiazem and verapamil): increased risk of bradycardia, AV block and myocardial depression.  []   Cyclosporine: Amiodarone increases levels of cyclosporine. Reduced dose of cyclosporine is recommended.  []  Digoxin dose should be halved when amiodarone is started.  []  Diuretics: increased risk of cardiotoxicity if hypokalemia occurs.  []  Oral hypoglycemic agents (glyburide, glipizide, glimepiride): increased risk of hypoglycemia. Patient's glucose levels should be monitored closely when initiating amiodarone therapy.   []  Drugs that prolong the QT interval:  Torsades de pointes risk  may be increased with concurrent use - avoid if possible.  Monitor QTc, also keep magnesium/potassium WNL if concurrent therapy can't be avoided. Marland Kitchen. Antibiotics: e.g. fluoroquinolones, erythromycin. . Antiarrhythmics: e.g. quinidine, procainamide, disopyramide, sotalol. . Antipsychotics: e.g. phenothiazines, haloperidol.  . Lithium, tricyclic antidepressants, and methadone.  Thank You,  Girard CooterKimberly Perkins, PharmD Clinical Pharmacist  Pager: [phone] Clinical Phone for 10/13/2017 until 3:30pm: [phone] If after 3:30pm, please call main pharmacy at [phone] 10/13/2017 1:58 PM

## 2017-10-13 NOTE — Progress Notes (Signed)
Patient ID: Scott Rosales, male   DOB: Sep 19, 1961, 56 y.o.   MRN: [id number]  TCTS Evening Rounds:   Hemodynamically stable  CI = 2.3  Extubated  Urine output good  CT output low  CBC    Component Value Date/Time   WBC 14.0 (H) 10/13/2017 1956   RBC 4.24 10/13/2017 1956   HGB 11.9 (L) 10/13/2017 2004   HCT 35.0 (L) 10/13/2017 2004   PLT 151 10/13/2017 1956   MCV 88.2 10/13/2017 1956   MCH 30.7 10/13/2017 1956   MCHC 34.8 10/13/2017 1956   RDW 13.6 10/13/2017 1956   LYMPHSABS 1.2 10/13/2017 1956   MONOABS 0.6 10/13/2017 1956   EOSABS 0.0 10/13/2017 1956   BASOSABS 0.0 10/13/2017 1956     BMET    Component Value Date/Time   NA 138 10/13/2017 2004   K 3.9 10/13/2017 2004   CL 100 (L) 10/13/2017 2004   CO2 26 10/13/2017 0551   GLUCOSE 174 (H) 10/13/2017 2004   BUN 10 10/13/2017 2004   CREATININE 0.90 10/13/2017 2004   CALCIUM 9.2 10/13/2017 0551   GFRNONAA >60 10/13/2017 0551   GFRAA >60 10/13/2017 0551     A/P:  Stable postop course. Continue current plans

## 2017-10-13 NOTE — Anesthesia Procedure Notes (Signed)
Arterial Line Insertion Start/End12/01/2017 6:50 AM Performed by: Jeani HawkingBerry, Lilla Callejo J, CRNA, CRNA  Preanesthetic checklist: patient identified, IV checked, site marked, risks and benefits discussed, surgical consent, monitors and equipment checked, pre-op evaluation and timeout performed Lidocaine 1% used for infiltration Left, radial was placed Catheter size: 20 G Maximum sterile barriers used   Attempts: 1 Procedure performed using ultrasound guided technique. Ultrasound Notes:anatomy identified Following insertion, Biopatch and dressing applied. Post procedure assessment: unchanged  Patient tolerated the procedure well with no immediate complications.

## 2017-10-13 NOTE — Progress Notes (Signed)
      [address]       [place], [id number]             [phone]     Pre Procedure note for inpatients:   Scott Teressa LowerDrew Rosales has been scheduled for Procedure(s): CORONARY ARTERY BYPASS GRAFTING (CABG) x 4 WITH TEE AND ENDOVEIN HARVESTING (N/A) today. The various methods of treatment have been discussed with the patient. After consideration of the risks, benefits and treatment options the patient has consented to the planned procedure.   The patient has been seen and labs reviewed. There are no changes in the patient's condition to prevent proceeding with the planned procedure today.  Recent labs:  Lab Results  Component Value Date   WBC 5.4 10/13/2017   HGB 14.3 10/13/2017   HCT 41.9 10/13/2017   PLT 170 10/13/2017   GLUCOSE 101 (H) 10/13/2017   CHOL 264 (H) 10/12/2017   TRIG 126 10/12/2017   HDL 60 10/12/2017   LDLCALC 179 (H) 10/12/2017   ALT 27 10/12/2017   AST 42 (H) 10/12/2017   NA 136 10/13/2017   K 3.9 10/13/2017   CL 102 10/13/2017   CREATININE 1.23 10/13/2017   BUN 11 10/13/2017   CO2 26 10/13/2017   INR 0.99 10/12/2017    Scott OvensEdward B Johnetta Sloniker, MD 10/13/2017 7:31 AM

## 2017-10-13 NOTE — Brief Op Note (Addendum)
      [address]       [place] [id number]             [phone]     10/13/2017  1:39 PM  PATIENT:  Scott Rosales  56 y.o. male  PRE-OPERATIVE DIAGNOSIS:  CAD  POST-OPERATIVE DIAGNOSIS:  CAD  PROCEDURE:  Procedure(s): CORONARY ARTERY BYPASS GRAFTING (CABG) x 4, LIMA to LAD, SVG to OM1 and distal circ, SVG to PDA, using left internal mammary artery and right greater saphenous vein harvested endoscopically (N/A) TRANSESOPHAGEAL ECHOCARDIOGRAM (TEE)  LIMA to LAD SVG to OM1 and  circ SVG to PDA  Findings: acute occlusion of PDA with inferior mi    SURGEON:  Surgeon(s) and Role:    * Delight OvensGerhardt, Quinnlan Abruzzo B, MD - Primary  PHYSICIAN ASSISTANT:  Jari Favreessa Conte, PA-C   ANESTHESIA:   general  EBL:  TBD  BLOOD ADMINISTERED:none  DRAINS: ROUTINE   LOCAL MEDICATIONS USED:  NONE  SPECIMEN:  No Specimen  DISPOSITION OF SPECIMEN:  N/A  COUNTS:  YES   DICTATION: .Dragon Dictation  PLAN OF CARE: Admit to inpatient   PATIENT DISPOSITION:  ICU - intubated and hemodynamically stable.   Delay start of Pharmacological VTE agent (>24hrs) due to surgical blood loss or risk of bleeding: yes

## 2017-10-13 NOTE — Anesthesia Procedure Notes (Signed)
Central Venous Catheter Insertion Performed by: Cecile Hearingurk, Earl Losee Edward, MD, anesthesiologist Start/End12/01/2017 6:45 AM, 10/13/2017 6:50 AM Patient location: Pre-op. Preanesthetic checklist: patient identified, IV checked, site marked, risks and benefits discussed, surgical consent, monitors and equipment checked, pre-op evaluation, timeout performed and anesthesia consent Hand hygiene performed  and maximum sterile barriers used  Total catheter length 100. PA cath was placed.Swan type:thermodilution PA Cath depth:40 Procedure performed without using ultrasound guided technique. Attempts: 1 Patient tolerated the procedure well with no immediate complications.

## 2017-10-13 NOTE — Progress Notes (Signed)
Pre-op prep completed. Consent verified & placed in chart. All questions addressed & answered. Family at bedside. Remains CP free. Will continue to monitor.

## 2017-10-13 NOTE — Anesthesia Preprocedure Evaluation (Addendum)
Anesthesia Evaluation  Patient identified by MRN, date of birth, ID band Patient awake    Reviewed: Allergy & Precautions, NPO status , Patient's Chart, lab work & pertinent test results, reviewed documented beta blocker date and time   Airway Mallampati: II  TM Distance: >3 FB Neck ROM: Full   Comment: Narrow maxilla , high arched palate noted Dental  (+) Teeth Intact, Dental Advisory Given   Pulmonary neg pulmonary ROS,    Pulmonary exam normal breath sounds clear to auscultation       Cardiovascular hypertension, Pt. on home beta blockers + CAD and + Past MI  Normal cardiovascular exam Rhythm:Regular Rate:Normal  Echo 10/13/17: Study Conclusions  - Left ventricle: The cavity size was normal. Wall thickness was   increased increased in a pattern of mild to moderate LVH.   Systolic function was normal. The estimated ejection fraction was   in the range of 55% to 60%. Hypokinesis of the basalinferior   myocardium. - Left atrium: The atrium was mildly dilated.   Neuro/Psych negative neurological ROS  negative psych ROS   GI/Hepatic negative GI ROS, Neg liver ROS,   Endo/Other  negative endocrine ROSObesity   Renal/GU negative Renal ROS     Musculoskeletal negative musculoskeletal ROS (+)   Abdominal   Peds  Hematology negative hematology ROS (+)   Anesthesia Other Findings Day of surgery medications reviewed with the patient.  Reproductive/Obstetrics                            Anesthesia Physical Anesthesia Plan  ASA: IV  Anesthesia Plan: General   Post-op Pain Management:    Induction: Intravenous  PONV Risk Score and Plan: 2 and Treatment may vary due to age or medical condition  Airway Management Planned: Oral ETT and Video Laryngoscope Planned  Additional Equipment: Arterial line, CVP, PA Cath, TEE and Ultrasound Guidance Line Placement  Intra-op Plan:   Post-operative  Plan: Post-operative intubation/ventilation  Informed Consent: I have reviewed the patients History and Physical, chart, labs and discussed the procedure including the risks, benefits and alternatives for the proposed anesthesia with the patient or authorized representative who has indicated his/her understanding and acceptance.   Dental advisory given  Plan Discussed with: CRNA  Anesthesia Plan Comments: (Risks/benefits of general anesthesia discussed with patient including risk of damage to teeth, lips, gum, and tongue, nausea/vomiting, allergic reactions to medications, and the possibility of heart attack, stroke and death.  All patient questions answered.  Patient wishes to proceed.)       Anesthesia Quick Evaluation

## 2017-10-13 NOTE — Anesthesia Postprocedure Evaluation (Signed)
Anesthesia Post Note  Patient: Scott Rosales  Procedure(s) Performed: CORONARY ARTERY BYPASS GRAFTING (CABG) x 4, LIMA to LAD, SVG to OM1 and distal circ, SVG to PDA, using left internal mammary artery and right greater saphenous vein harvested endoscopically (N/A Chest) TRANSESOPHAGEAL ECHOCARDIOGRAM (TEE)     Patient location during evaluation: SICU Anesthesia Type: General Level of consciousness: sedated Pain management: pain level controlled Vital Signs Assessment: post-procedure vital signs reviewed and stable Respiratory status: patient remains intubated per anesthesia plan Cardiovascular status: stable Postop Assessment: no apparent nausea or vomiting Anesthetic complications: no    Last Vitals:  Vitals:   10/13/17 0508 10/13/17 1329  BP: (!) 136/97 123/77  Pulse: 73 80  Resp: (!) 21 13  Temp: 36.7 C   SpO2: 96% 96%    Last Pain:  Vitals:   10/13/17 0546  TempSrc:   PainSc: 0-No pain                 Belladonna Lubinski,JAMES TERRILL

## 2017-10-14 ENCOUNTER — Inpatient Hospital Stay (HOSPITAL_COMMUNITY): Payer: 59

## 2017-10-14 ENCOUNTER — Encounter (HOSPITAL_COMMUNITY): Payer: Self-pay | Admitting: Cardiothoracic Surgery

## 2017-10-14 LAB — BASIC METABOLIC PANEL
Anion gap: 9 (ref 5–15)
BUN: 12 mg/dL (ref 6–20)
CALCIUM: 8 mg/dL — AB (ref 8.9–10.3)
CO2: 23 mmol/L (ref 22–32)
CREATININE: 0.98 mg/dL (ref 0.61–1.24)
Chloride: 101 mmol/L (ref 101–111)
GFR calc Af Amer: >60 mL/min (ref >60)
GFR calc non Af Amer: >60 mL/min (ref >60)
GLUCOSE: 142 mg/dL — AB (ref 65–99)
Potassium: 4.2 mmol/L (ref 3.5–5.1)
Sodium: 133 mmol/L — ABNORMAL LOW (ref 135–145)

## 2017-10-14 LAB — POCT I-STAT, CHEM 8
BUN: 16 mg/dL (ref 6–20)
CHLORIDE: 98 mmol/L — AB (ref 101–111)
Calcium, Ion: 1.2 mmol/L (ref 1.15–1.40)
Creatinine, Ser: 1.2 mg/dL (ref 0.61–1.24)
GLUCOSE: 133 mg/dL — AB (ref 65–99)
HEMATOCRIT: 36 % — AB (ref 39.0–52.0)
HEMOGLOBIN: 12.2 g/dL — AB (ref 13.0–17.0)
POTASSIUM: 4 mmol/L (ref 3.5–5.1)
SODIUM: 134 mmol/L — AB (ref 135–145)
TCO2: 24 mmol/L (ref 22–32)

## 2017-10-14 LAB — GLUCOSE, CAPILLARY
GLUCOSE-CAPILLARY: 103 mg/dL — AB (ref 65–99)
GLUCOSE-CAPILLARY: 104 mg/dL — AB (ref 65–99)
GLUCOSE-CAPILLARY: 117 mg/dL — AB (ref 65–99)
GLUCOSE-CAPILLARY: 119 mg/dL — AB (ref 65–99)
GLUCOSE-CAPILLARY: 119 mg/dL — AB (ref 65–99)
GLUCOSE-CAPILLARY: 120 mg/dL — AB (ref 65–99)
GLUCOSE-CAPILLARY: 123 mg/dL — AB (ref 65–99)
GLUCOSE-CAPILLARY: 150 mg/dL — AB (ref 65–99)
GLUCOSE-CAPILLARY: 86 mg/dL (ref 65–99)
GLUCOSE-CAPILLARY: 99 mg/dL (ref 65–99)
Glucose-Capillary: 139 mg/dL — ABNORMAL HIGH (ref 65–99)
Glucose-Capillary: 139 mg/dL — ABNORMAL HIGH (ref 65–99)
Glucose-Capillary: 133 mg/dL — ABNORMAL HIGH (ref 65–99)
Glucose-Capillary: 121 mg/dL — ABNORMAL HIGH (ref 65–99)
Glucose-Capillary: 120 mg/dL — ABNORMAL HIGH (ref 65–99)
Glucose-Capillary: 118 mg/dL — ABNORMAL HIGH (ref 65–99)
Glucose-Capillary: 120 mg/dL — ABNORMAL HIGH (ref 65–99)

## 2017-10-14 LAB — CBC
HEMATOCRIT: 34.1 % — AB (ref 39.0–52.0)
HEMATOCRIT: 35.2 % — AB (ref 39.0–52.0)
HEMOGLOBIN: 11.8 g/dL — AB (ref 13.0–17.0)
Hemoglobin: 11.6 g/dL — ABNORMAL LOW (ref 13.0–17.0)
MCH: 30 pg (ref 26.0–34.0)
MCH: 29.8 pg (ref 26.0–34.0)
MCHC: 34 g/dL (ref 30.0–36.0)
MCHC: 33.5 g/dL (ref 30.0–36.0)
MCV: 88.1 fL (ref 78.0–100.0)
MCV: 88.9 fL (ref 78.0–100.0)
PLATELETS: 145 10*3/uL — AB (ref 150–400)
Platelets: 127 10*3/uL — ABNORMAL LOW (ref 150–400)
RBC: 3.87 MIL/uL — ABNORMAL LOW (ref 4.22–5.81)
RBC: 3.96 MIL/uL — AB (ref 4.22–5.81)
RDW: 13.7 % (ref 11.5–15.5)
RDW: 14 % (ref 11.5–15.5)
WBC: 11.2 10*3/uL — ABNORMAL HIGH (ref 4.0–10.5)
WBC: 13.2 10*3/uL — AB (ref 4.0–10.5)

## 2017-10-14 LAB — CREATININE, SERUM
Creatinine, Ser: 1.27 mg/dL — ABNORMAL HIGH (ref 0.61–1.24)
GFR calc non Af Amer: >60 mL/min (ref >60)

## 2017-10-14 LAB — MAGNESIUM
MAGNESIUM: 2.3 mg/dL (ref 1.7–2.4)
Magnesium: 2.4 mg/dL (ref 1.7–2.4)

## 2017-10-14 MED ORDER — INSULIN DETEMIR 100 UNIT/ML ~~LOC~~ SOLN
10.0000 [IU] | Freq: Once | SUBCUTANEOUS | Status: AC
  Administered 2017-10-14: 10 [IU] via SUBCUTANEOUS
  Filled 2017-10-14: qty 0.1

## 2017-10-14 MED ORDER — METOCLOPRAMIDE HCL 5 MG/ML IJ SOLN
5.0000 mg | Freq: Three times a day (TID) | INTRAMUSCULAR | Status: AC
  Administered 2017-10-14 (×3): 5 mg via INTRAVENOUS
  Filled 2017-10-14 (×3): qty 2

## 2017-10-14 MED ORDER — INSULIN ASPART 100 UNIT/ML ~~LOC~~ SOLN
0.0000 [IU] | SUBCUTANEOUS | Status: DC
  Administered 2017-10-15: 2 [IU] via SUBCUTANEOUS

## 2017-10-14 MED ORDER — ENOXAPARIN SODIUM 30 MG/0.3ML ~~LOC~~ SOLN
30.0000 mg | Freq: Every day | SUBCUTANEOUS | Status: DC
  Administered 2017-10-14 – 2017-10-16 (×3): 30 mg via SUBCUTANEOUS
  Filled 2017-10-14 (×3): qty 0.3

## 2017-10-14 MED ORDER — INSULIN DETEMIR 100 UNIT/ML ~~LOC~~ SOLN
15.0000 [IU] | Freq: Once | SUBCUTANEOUS | Status: DC
  Filled 2017-10-14: qty 0.15

## 2017-10-14 MED ORDER — KETOROLAC TROMETHAMINE 15 MG/ML IJ SOLN
15.0000 mg | Freq: Four times a day (QID) | INTRAMUSCULAR | Status: AC | PRN
  Administered 2017-10-14 (×2): 15 mg via INTRAVENOUS
  Filled 2017-10-14 (×2): qty 1

## 2017-10-14 MED FILL — Magnesium Sulfate Inj 50%: INTRAMUSCULAR | Qty: 10 | Status: AC

## 2017-10-14 MED FILL — Potassium Chloride Inj 2 mEq/ML: INTRAVENOUS | Qty: 20 | Status: AC

## 2017-10-14 MED FILL — Heparin Sodium (Porcine) Inj 1000 Unit/ML: INTRAMUSCULAR | Qty: 30 | Status: AC

## 2017-10-14 NOTE — Progress Notes (Signed)
Inpatient Diabetes Program Recommendations  AACE/ADA: New Consensus Statement on Inpatient Glycemic Control (2015)  Target Ranges:  Prepandial:   less than 140 mg/dL      Peak postprandial:   less than 180 mg/dL (1-2 hours)      Critically ill patients:  140 - 180 mg/dL   Lab Results  Component Value Date   GLUCAP 118 (H) 10/14/2017   HGBA1C 5.7 (H) 10/12/2017    Review of Glycemic Control  Diabetes history: None Outpatient Diabetes medications: N/A Current orders for Inpatient glycemic control: Levemir 10 units x 1, Novolog 0-24 units Q4H.  HgbA1C - 5.7% - indicating pre-diabetes.  Inpatient Diabetes Program Recommendations:    Levemir 10 units Q24H  Consider metformin at discharge. Stress importance of lifestyle changes to control blood sugars.  Will speak with pt about his pre-diabetes.   Thank you. Ailene Ardshonda Calix Heinbaugh, RD, LDN, CDE Inpatient Diabetes Coordinator [phone]

## 2017-10-14 NOTE — Progress Notes (Signed)
EKG CRITICAL VALUE     12 lead EKG performed.  Critical value noted.  Herbert SetaHeather, RN notified.   Guadalupe DawnMarilyn R Jacere Pangborn, CCT 10/14/2017 7:05 AM

## 2017-10-14 NOTE — Plan of Care (Signed)
Pt is progressing without complication. Restarted Nitro early in shift to maintain BP within prescribed limits-Pt bradycardic underneath pacer-held lopressor; pt given 2-4 mg of Morphine for pain control + repositioned Q2 for comfort; pt gets nauseous with movement/heat-4mg  Zofran given+temp adjusted in room. Will continue to monitor pt and adjust care to meet pt needs.

## 2017-10-14 NOTE — Progress Notes (Signed)
[facility] BRIEF SICU PROGRESS NOTE  1 Day Post-Op  S/P Procedure(s) (LRB): CORONARY ARTERY BYPASS GRAFTING (CABG) x 4, LIMA to LAD, SVG to OM1 and distal circ, SVG to PDA, using left internal mammary artery and right greater saphenous vein harvested endoscopically (N/A) TRANSESOPHAGEAL ECHOCARDIOGRAM (TEE)   Stable day NSR w/ stable BP Breathing comfortably w/ O2 sats 90-93% on 2 L/min UOP marginal but adequate Labs okay  Plan: Continue current plan  Purcell Nailslarence H Benedict Kue, MD 10/14/2017 6:41 PM

## 2017-10-14 NOTE — Progress Notes (Addendum)
Patient ID: Scott Rosales, male   DOB: 1961-08-30, 56 y.o.   MRN: [id number] TCTS DAILY ICU PROGRESS NOTE                   [address]            [place] [id number]          [phone]   1 Day Post-Op Procedure(s) (LRB): CORONARY ARTERY BYPASS GRAFTING (CABG) x 4, LIMA to LAD, SVG to OM1 and distal circ, SVG to PDA, using left internal mammary artery and right greater saphenous vein harvested endoscopically (N/A) TRANSESOPHAGEAL ECHOCARDIOGRAM (TEE)  Total Length of Stay:  LOS: 2 days   Subjective: Patient awake alert neurologically intact, extubated last night without difficulty  Objective: Vital signs in last 24 hours: Temp:  [95.4 F (35.2 C)-98.8 F (37.1 C)] 98.8 F (37.1 C) (12/04 0700) Pulse Rate:  [59-94] 90 (12/04 0700) Cardiac Rhythm: Atrial paced (12/04 0400) Resp:  [12-28] 19 (12/04 0700) BP: (97-140)/(67-108) 107/79 (12/04 0700) SpO2:  [94 %-100 %] 96 % (12/04 0700) Arterial Line BP: (102-158)/(44-143) 122/62 (12/04 0700) FiO2 (%):  [40 %-50 %] 40 % (12/03 1821) Weight:  [271 lb 11.2 oz (123.2 kg)] 271 lb 11.2 oz (123.2 kg) (12/04 0545)  Filed Weights   10/12/17 0200 10/13/17 0539 10/14/17 0545  Weight: 264 lb 15.9 oz (120.2 kg) 261 lb 12.8 oz (118.8 kg) 271 lb 11.2 oz (123.2 kg)    Weight change: 9 lb 14.4 oz (4.491 kg)   Hemodynamic parameters for last 24 hours: PAP: (14-30)/(6-18) 19/6 CO:  [3 L/min-7 L/min] 7 L/min CI:  [1.3 L/min/m2-3 L/min/m2] 3 L/min/m2  Intake/Output from previous day: 12/03 0701 - 12/04 0700 In: 5624.4 [I.V.:4244.4; Blood:755; IV Piggyback:625] Out: 3890 [Urine:2290; Blood:1110; Chest Tube:490]  Intake/Output this shift: No intake/output data recorded.  Current Meds: Scheduled Meds: . acetaminophen  1,000 mg Oral Q6H   Or  . acetaminophen (TYLENOL) oral liquid 160 mg/5 mL  1,000 mg Per Tube Q6H  . aspirin EC  325 mg Oral Daily   Or  . aspirin  324 mg Per Tube Daily  . bisacodyl  10 mg Oral Daily    Or  . bisacodyl  10 mg Rectal Daily  . docusate sodium  200 mg Oral Daily  . ezetimibe  10 mg Oral q1800  . insulin regular  0-10 Units Intravenous TID WC  . mouth rinse  15 mL Mouth Rinse BID  . metoprolol tartrate  12.5 mg Oral BID   Or  . metoprolol tartrate  12.5 mg Per Tube BID  . [START ON 10/15/2017] pantoprazole  40 mg Oral Daily  . sodium chloride flush  3 mL Intravenous Q12H   Continuous Infusions: . sodium chloride    . sodium chloride    . sodium chloride 20 mL/hr at 10/14/17 0600  . albumin human    . amiodarone Stopped (10/13/17 1930)  . cefUROXime (ZINACEF)  IV Stopped (10/14/17 0327)  . dexmedetomidine (PRECEDEX) IV infusion Stopped (10/14/17 0515)  . famotidine (PEPCID) IV    . insulin (NOVOLIN-R) infusion 3.5 Units/hr (10/14/17 0717)  . lactated ringers 20 mL/hr at 10/14/17 0600  . lactated ringers 20 mL/hr at 10/14/17 0600  . nitroGLYCERIN 30 mcg/min (10/14/17 0600)  . phenylephrine (NEO-SYNEPHRINE) Adult infusion Stopped (10/13/17 1652)   PRN Meds:.sodium chloride, albumin human, fentaNYL (SUBLIMAZE) injection, metoprolol tartrate, midazolam, ondansetron (ZOFRAN) IV, oxyCODONE, sodium chloride flush, traMADol  General appearance: alert, cooperative and no  distress Neurologic: intact Heart: regular rate and rhythm, S1, S2 normal, no murmur, click, rub or gallop and Pacer turned off this morning patient's underlying rhythm is sinus Lungs: diminished breath sounds bibasilar Abdomen: soft, non-tender; bowel sounds normal; no masses,  no organomegaly Extremities: extremities normal, atraumatic, no cyanosis or edema Wound: Sternum stable  Lab Results: CBC: Recent Labs    10/13/17 1956 10/13/17 2004 10/14/17 0411  WBC 14.0*  --  11.2*  HGB 13.0 11.9* 11.6*  HCT 37.4* 35.0* 34.1*  PLT 151  --  127*   BMET:  Recent Labs    10/13/17 1956 10/13/17 2004 10/14/17 0411  NA 132* 138 133*  K 3.9 3.9 4.2  CL 102 100* 101  CO2 22  --  23  GLUCOSE 168*  174* 142*  BUN 10 10 12   CREATININE 1.08 0.90 0.98  CALCIUM 8.2*  --  8.0*    CMET: Lab Results  Component Value Date   WBC 11.2 (H) 10/14/2017   HGB 11.6 (L) 10/14/2017   HCT 34.1 (L) 10/14/2017   PLT 127 (L) 10/14/2017   GLUCOSE 142 (H) 10/14/2017   CHOL 264 (H) 10/12/2017   TRIG 126 10/12/2017   HDL 60 10/12/2017   LDLCALC 179 (H) 10/12/2017   ALT 20 10/13/2017   AST 46 (H) 10/13/2017   NA 133 (L) 10/14/2017   K 4.2 10/14/2017   CL 101 10/14/2017   CREATININE 0.98 10/14/2017   BUN 12 10/14/2017   CO2 23 10/14/2017   TSH 3.720 10/13/2017   INR 1.28 10/13/2017   HGBA1C 5.7 (H) 10/12/2017      PT/INR:  Recent Labs    10/13/17 1340  LABPROT 15.9*  INR 1.28   Radiology: Dg Chest Port 1 View  Result Date: 10/13/2017 CLINICAL DATA:  CABG EXAM: PORTABLE CHEST 1 VIEW COMPARISON:  10/12/2017 FINDINGS: Endotracheal tube in good position. Swan-Ganz catheter in the pulmonary outflow tract. NG tube in the stomach. Left chest tube in place. Negative for pneumothorax. Hypoventilation with bibasilar atelectasis and small effusions. Negative for edema. IMPRESSION: Support lines in good position.  No pneumothorax Bibasilar atelectasis and small pleural effusions left greater than right. Electronically Signed   By: Marlan Palauharles  Clark M.D.   On: 10/13/2017 15:01     Assessment/Plan: S/P Procedure(s) (LRB): CORONARY ARTERY BYPASS GRAFTING (CABG) x 4, LIMA to LAD, SVG to OM1 and distal circ, SVG to PDA, using left internal mammary artery and right greater saphenous vein harvested endoscopically (N/A) TRANSESOPHAGEAL ECHOCARDIOGRAM (TEE) Mobilize Diuresis Diabetes control d/c tubes/lines See progression orders Expected Acute  Blood - loss Anemia- continue to monitor  Preop inferior myocardial infarction on admission confirmed at surgery Renal function is stable Patient is intolerant of statins-but will need aggressive lipid management LDL of 180 Brief episode of atrial fib in the OR  patient was started on amnio, discontinued last night when underlying heart rhythm dropped to the 40s  Delight Ovensdward B Quintez Maselli 10/14/2017 8:07 AM The

## 2017-10-15 ENCOUNTER — Inpatient Hospital Stay (HOSPITAL_COMMUNITY): Payer: 59

## 2017-10-15 ENCOUNTER — Other Ambulatory Visit: Payer: Self-pay

## 2017-10-15 DIAGNOSIS — I2510 Atherosclerotic heart disease of native coronary artery without angina pectoris: Secondary | ICD-10-CM

## 2017-10-15 DIAGNOSIS — E785 Hyperlipidemia, unspecified: Secondary | ICD-10-CM

## 2017-10-15 HISTORY — DX: Atherosclerotic heart disease of native coronary artery without angina pectoris: I25.10

## 2017-10-15 LAB — BASIC METABOLIC PANEL
Anion gap: 8 (ref 5–15)
BUN: 19 mg/dL (ref 6–20)
CO2: 27 mmol/L (ref 22–32)
Calcium: 8.4 mg/dL — ABNORMAL LOW (ref 8.9–10.3)
Chloride: 97 mmol/L — ABNORMAL LOW (ref 101–111)
Creatinine, Ser: 1.38 mg/dL — ABNORMAL HIGH (ref 0.61–1.24)
GFR calc Af Amer: >60 mL/min (ref >60)
GFR calc non Af Amer: 56 mL/min — ABNORMAL LOW (ref >60)
Glucose, Bld: 128 mg/dL — ABNORMAL HIGH (ref 65–99)
Potassium: 3.8 mmol/L (ref 3.5–5.1)
Sodium: 132 mmol/L — ABNORMAL LOW (ref 135–145)

## 2017-10-15 LAB — GLUCOSE, CAPILLARY
GLUCOSE-CAPILLARY: 113 mg/dL — AB (ref 65–99)
GLUCOSE-CAPILLARY: 113 mg/dL — AB (ref 65–99)
GLUCOSE-CAPILLARY: 98 mg/dL (ref 65–99)
Glucose-Capillary: 128 mg/dL — ABNORMAL HIGH (ref 65–99)
Glucose-Capillary: 107 mg/dL — ABNORMAL HIGH (ref 65–99)
Glucose-Capillary: 82 mg/dL (ref 65–99)

## 2017-10-15 LAB — CBC
HCT: 33.5 % — ABNORMAL LOW (ref 39.0–52.0)
Hemoglobin: 11.4 g/dL — ABNORMAL LOW (ref 13.0–17.0)
MCH: 30.4 pg (ref 26.0–34.0)
MCHC: 34 g/dL (ref 30.0–36.0)
MCV: 89.3 fL (ref 78.0–100.0)
Platelets: 128 10*3/uL — ABNORMAL LOW (ref 150–400)
RBC: 3.75 MIL/uL — ABNORMAL LOW (ref 4.22–5.81)
RDW: 14.3 % (ref 11.5–15.5)
WBC: 11.6 10*3/uL — ABNORMAL HIGH (ref 4.0–10.5)

## 2017-10-15 MED ORDER — FUROSEMIDE 40 MG PO TABS
40.0000 mg | ORAL_TABLET | Freq: Once | ORAL | Status: AC
  Administered 2017-10-15: 40 mg via ORAL
  Filled 2017-10-15: qty 1

## 2017-10-15 MED ORDER — FUROSEMIDE 10 MG/ML IJ SOLN
20.0000 mg | Freq: Two times a day (BID) | INTRAMUSCULAR | Status: DC
  Administered 2017-10-15: 20 mg via INTRAVENOUS
  Filled 2017-10-15: qty 2

## 2017-10-15 MED ORDER — POTASSIUM CHLORIDE CRYS ER 20 MEQ PO TBCR
40.0000 meq | EXTENDED_RELEASE_TABLET | Freq: Once | ORAL | Status: AC
  Administered 2017-10-15: 40 meq via ORAL
  Filled 2017-10-15: qty 2

## 2017-10-15 MED ORDER — POTASSIUM CHLORIDE CRYS ER 20 MEQ PO TBCR: 20.0000 meq | EXTENDED_RELEASE_TABLET | Freq: Once | ORAL | Status: DC

## 2017-10-15 MED ORDER — INSULIN ASPART 100 UNIT/ML ~~LOC~~ SOLN: 0.0000 [IU] | Freq: Three times a day (TID) | SUBCUTANEOUS | Status: DC

## 2017-10-15 NOTE — Discharge Summary (Signed)
Physician Discharge Summary       [address]       [place] [address]             [phone]    Patient ID: Scott Rosales MRN: [id number] DOB/AGE: 57-Nov-1962 57 y.o.  Admit date: 10/12/2017 Discharge date: 10/17/2017  Admission Diagnoses: 1. NSTEMI (non-ST elevated myocardial infarction) ([place]) 2. Coronary artery disease  Active Diagnoses:  1. Tobacco abuse 2. Hyperlipidemia-intolerant statins 3. ABL anemia 4. Thrombocytopenia 5. Obesity    Procedure (s): INTRAVASCULAR PRESSURE WIRE/FFR STUDY  LEFT HEART CATH AND CORONARY ANGIOGRAPHY by Dr. Ellyn Hack on 10/12/2017:  Conclusion     Ost RPDA lesion is 100% stenosed.  Bifurcation Cx-OM1 Ost 1st Mrg lesion is 95% stenosed. Mid Cx lesion is 90% stenosed.  Prox LAD lesion is 55% stenosed. Mid LAD lesion is 70% stenosed. FFR 0.79.  The left ventricular systolic function is normal. The left ventricular ejection fraction is greater than 65% by visual estimate.  LV end diastolic pressure is normal.    SEVERE 3-4 VESSEL CAD: 100% (Subacute) ostRPDA, 90% mCx @ OM1 with 95% OM1 (bifurcation 0,1,1) lesions (hazy, irregular/thrombotic), diffuse mLAD ~70% lesion (FFR 0.79).Mild to moderate disease throughout the RCA with RPL.  Ectatic, aneurysmal left main, ostial-proximal LAD dilation -not optimal for guide placement.  Well-preserved LVEF with mid inferior wall hypokinesis but otherwise no significant wall motion and normalities.  Moderately elevated LVEDP    PLAN OF CARE:Return to nursing unit for ongoing care.TR band removal per protocol  The patient has three-vessel disease. We are in the process of calling CV TS for CABG consult  I will start IV Aggrastat now and restart heparin in 8 hours given the ulcerated thrombotic appearance of the circumflex bifurcation lesion and is ongoing pain.  We will start low-dose beta-blocker and high-dose statin  He will had to return back to his nursing  unit because there are no stepdown or ICU beds available--he is currently chest pain-free   ------------------------------------------------------------------- Transthoracic Echocardiography  Patient:    Scott Rosales, Scott Rosales MR #:       [id number] Study Date: 10/12/2017 Gender:     M Age:        56 Height:     177.8 cm Weight:     120.2 kg BSA:        2.49 m^2 Pt. Status: Room:       4E01C   ORDERING     Lanelle Bal MD  REFERRING    Lanelle Bal MD  PERFORMING   [facility], Inpatient  ADMITTING    [place]    Chakravartti, Jaidip  SONOGRAPHER  Cardell Peach, RDCS  cc:  ------------------------------------------------------------------- LV EF: 55% -   60%  ------------------------------------------------------------------- Indications:      MI - acute 410.91.  ------------------------------------------------------------------- History:   PMH:   Myocardial infarction.  ------------------------------------------------------------------- Study Conclusions  - Left ventricle: The cavity size was normal. Wall thickness was   increased increased in a pattern of mild to moderate LVH.   Systolic function was normal. The estimated ejection fraction was   in the range of 55% to 60%. Hypokinesis of the basalinferior   myocardium. - Left atrium: The atrium was mildly dilated.  ------------------------------------------------------------------- Study data:  No prior study was available for comparison.  Study status:  Routine.  Procedure:  Transthoracic echocardiography. Image quality was adequate.  Study completion:  There were no complications.  Transthoracic echocardiography.  M-mode, complete 2D, spectral Doppler, and color Doppler.  Birthdate: Patient birthdate: Feb 12, 1961.  Age:  Patient is 56 yr old.  Sex: Gender: male.    BMI: 38 kg/m^2.  Blood pressure:     129/91 Patient status:  Inpatient.  Study date:   Study date: 10/12/2017. Study time: 03:06 PM.  Location:  Bedside.  -------------------------------------------------------------------  ------------------------------------------------------------------- Left ventricle:  The cavity size was normal. Wall thickness was increased increased in a pattern of mild to moderate LVH. Systolic function was normal. The estimated ejection fraction was in the range of 55% to 60%.  Regional wall motion abnormalities: Hypokinesis of the basalinferior myocardium.  ------------------------------------------------------------------- Aortic valve:   Trileaflet; normal thickness leaflets. Mobility was not restricted.  Doppler:  Transvalvular velocity was within the normal range. There was no stenosis. There was no regurgitation.   ------------------------------------------------------------------- Aorta:  Aortic root: The aortic root was normal in size.  ------------------------------------------------------------------- Mitral valve:   Structurally normal valve.   Mobility was not restricted.  Doppler:  Transvalvular velocity was within the normal range. There was no evidence for stenosis. There was no regurgitation.    Valve area by pressure half-time: 5.95 cm^2. Indexed valve area by pressure half-time: 2.39 cm^2/m^2.  ------------------------------------------------------------------- Left atrium:  The atrium was mildly dilated.  ------------------------------------------------------------------- Right ventricle:  The cavity size was normal. Wall thickness was normal. Systolic function was normal.  ------------------------------------------------------------------- Pulmonic valve:    The valve appears to be grossly normal. Doppler:  Transvalvular velocity was within the normal range. There was no evidence for stenosis. There was trivial regurgitation.  ------------------------------------------------------------------- Tricuspid  valve:   Structurally normal valve.    Doppler: Transvalvular velocity was within the normal range. There was mild regurgitation.  ------------------------------------------------------------------- Pulmonary artery:   The main pulmonary artery was normal-sized. Systolic pressure was within the normal range.  ------------------------------------------------------------------- Right atrium:  The atrium was normal in size.  ------------------------------------------------------------------- Pericardium:  There was no pericardial effusion.  Coronary artery bypass grafting x4 with the left internal mammary to the left anterior descending coronary artery, sequential reversed saphenous vein graft to the first obtuse marginal and circumflex, reversed saphenous vein graft to the posterior descending coronary artery with right greater saphenous thigh and calf endo vein harvesting by Dr. Servando Snare on 10/13/2017.  History of Presenting Illness: This is a 56 year old male with known hyperlipidemia, reportedly intolerant of statins, who was admitted with a non-STEMI myocardial infarction around 1:30 AM today.  Cardiac catheterization was performed this afternoon reportedly because the patient had eaten breakfast.  The patient had been on a trip to the beach and noted substernal discomfort all day yesterday and ultimately last evening went to the [facility] emergency room.  Initial workup revealed EKG without acute ischemic changes but his troponin was elevated.  He was given sublingual nitroglycerin started on a heparin drip and and transferred to [facility] for further treatment.  Patient has no previous known history of coronary artery disease;he denies diabetes, is a lifelong non-smoker, his family history is significant for father who died of a myocardial infarction at age 48, and his mother is still alive  Dr. Servando Snare discussed the need for coronary artery bypass grafting surgery. Risks and  options were discussed with the patient and his wife and family in detail, including the risk of bypass surgery including death infection stroke myocardial infarction bleeding blood transfusion.  The patient agreed to proceed with surgery. At the time of being seen, he was on Integrilin and the  patient is stable without significant chest pain.  Pre operative carotid duplex US showed no significant internal carotid artery stenosis bilaterally. He underwent a CABG x 4 on 10/13/2017.  Brief Hospital Course:  The patient was extubated the evening of surgery without difficulty. He remained afebrile and hemodynamically stable. He was on Amiodarone drip for brief episode of a fib in OR but it was stopped secondary to bradycardia into the 40's. Gordy Councilman, a line, chest tubes, and foley were removed early in the post operative course. Lopressor was started and titrated accordingly. Of note, he is intolerant of statins, but will need aggressive lipid management LDL of 180. He is on Zetia. He was volume over loaded and diuresed. Hehad ABL anemia. He did not require a post op transfusion. Last H and H was 11.4 and 33.7. He also had thrombocytopenia but this did resolve as last platelet count was 154,000. He was weaned off the insulin drip.  The patient's glucose remained well controlled. The patient's HGA1C pre op was 5.7. Patient is likely pre diabetic. Inpatient diabetes coordinator had met with patient. I have included education about preventing diabetes with his discharge instructions. Also, he does have a medical doctor and he will need to follow up with him after discharge. Hopefully, with diet and exercise, can avoid DM. The patient was felt surgically stable for transfer from the ICU to PCTU for further convalescence on 10/15/2017. He continues to progress with cardiac rehab. He was ambulating on room air. He has been tolerating a diet and has had a bowel movement. As discussed with Dr. Wynonia Lawman, will decrease ecasa to  81 mg daily and start Plavix 75 mg daily (had pre op MI). Will continue Zetia and Dr. Wynonia Lawman will likely start PCSK9 inhibitor as an outpatient 9since statin intolerant). Epicardial pacing wires were removed on 10/16/2017. Chest tube sutures will be removed 10/17/2017 the day of discharge. Patient's SBP more in the 130-140's and HR more in the 90's to low 100 so will increase Lopressor to 25 mg bid. The patient is felt surgically stable for discharge today.   Latest Vital Signs: Blood pressure 122/83, pulse 93, temperature 97.9 F (36.6 C), temperature source Oral, resp. rate 20, height '5\' 10"'  (1.778 m), weight 265 lb 9.6 oz (120.5 kg), SpO2 96 %.  Physical Exam: Cardiovascular: RRR Pulmonary: Slightly diminished at bases Abdomen: Soft, non tender, bowel sounds present. Extremities: Bilateral lower extremity edema. Wounds: Aquacel removed and sternal wound is clean and dry.  No erythema or signs of infection. RLE dressing removed and wound is clean and dry.  Discharge Condition:Stable and discharged to home.  Recent laboratory studies:  Lab Results  Component Value Date   WBC 10.0 10/16/2017   HGB 11.4 (L) 10/16/2017   HCT 33.7 (L) 10/16/2017   MCV 89.9 10/16/2017   PLT 154 10/16/2017   Lab Results  Component Value Date   NA 136 10/16/2017   K 3.9 10/16/2017   CL 100 (L) 10/16/2017   CO2 29 10/16/2017   CREATININE 1.26 (H) 10/16/2017   GLUCOSE 122 (H) 10/16/2017    Diagnostic Studies:  EXAM: CHEST  2 VIEW  COMPARISON:  10/15/2017.  FINDINGS: Interim removal of left chest tube and right IJ line. Prior CABG. Cardiomegaly with normal pulmonary vascularity. Persistent mild bibasilar atelectasis. Small bilateral pleural effusions. No pneumothorax .  IMPRESSION: 1. Interim removal of left chest tube and right IJ line. No pneumothorax noted.  2. Prior CABG. Stable cardiomegaly. Persistent bibasilar atelectasis. Small bilateral  pleural effusions.   Electronically  Signed   By: Marcello Moores  Register   On: 10/16/2017 08:13 Discharge Instructions    Amb Referral to Cardiac Rehabilitation   Complete by:  As directed    Diagnosis:   CABG Comment - to [facility] NSTEMI     CABG X ___:  4     Discharge Medications: Allergies as of 10/17/2017   No Known Allergies     Medication List    TAKE these medications   aspirin EC 81 MG tablet Take 81 mg by mouth daily.   clopidogrel 75 MG tablet Commonly known as:  PLAVIX Take 1 tablet (75 mg total) by mouth daily.   ezetimibe 10 MG tablet Commonly known as:  ZETIA Take 1 tablet (10 mg total) by mouth daily at 6 PM.   furosemide 40 MG tablet Commonly known as:  LASIX Take 1 tablet (40 mg total) by mouth daily. For 5 days then stop.   metoprolol tartrate 25 MG tablet Commonly known as:  LOPRESSOR Take 1 tablet (25 mg total) by mouth 2 (two) times daily.   oxyCODONE 5 MG immediate release tablet Commonly known as:  Oxy IR/ROXICODONE Take 5 mg by mouth every 4-6 hours PRN severe pain   potassium chloride SA 20 MEQ tablet Commonly known as:  K-DUR,KLOR-CON Take 1 tablet (20 mEq total) by mouth daily. For 5 days then stop.      The patient has been discharged on:   1.Beta Blocker:  Yes [ x  ]                              No   [   ]                              If No, reason:  2.Ace Inhibitor/ARB: Yes [   ]                                     No  [  x  ]                                     If No, reason: Slightly elevated creatinine;hope to start after discharge  3.Statin:   Yes [   ]                  No  [ x  ]                  If No, reason:Statin intolerant. On Zetia.  4.Ecasa:  Yes  [ x  ]                  No   [   ]                  If No, reason:  Follow Up Appointments: Follow-up Information    Grace Isaac, MD. Go on 11/20/2017.   Specialty:  Cardiothoracic Surgery Why:  PA/LAT CXR to be taken (at [place] which is in the same building as Dr. Everrett Coombe office)  on 11/20/2017 at 11:30 am;Appointment time is at 12:00 pm Contact information: [address] [place] [address] [phone]  [facility] Doctor. Schedule an appointment as soon as possible for a visit.   Why:  Call for an appointment regarding further surveillance of HGA1C 5.7 (pre diabetes).       Jacolyn Reedy, MD. Go on 11/10/2017.   Specialty:  Cardiology Why:  Appointment time is at 10:30 am Contact information: [place] [address] [phone]           Signed: Sharalyn Ink [facility] 10/17/2017, 12:04 PM

## 2017-10-15 NOTE — Progress Notes (Signed)
RT instructed pt and family on the use of flutter valve. Pt able to demonstrate back good technique. 

## 2017-10-15 NOTE — Progress Notes (Signed)
EKG CRITICAL VALUE     12 lead EKG performed.  Critical value noted.  Herbert SetaHeather, RN notified.   Lorelee MarketKerry A Crystal Scarberry, CCT 10/15/2017 7:58 AM

## 2017-10-15 NOTE — Progress Notes (Signed)
Visited with patient and prayed with patient per patient request.  Lovely family.  Thank all medical staff for their care.      10/15/17 1047  Clinical Encounter Type  Visited With Patient;Family  Visit Type Initial;Spiritual support  Spiritual Encounters  Spiritual Needs Prayer

## 2017-10-15 NOTE — Progress Notes (Addendum)
[facility] DAILY ICU PROGRESS NOTE                   [address]            [place] [address]          [phone]   2 Days Post-Op Procedure(s) (LRB): CORONARY ARTERY BYPASS GRAFTING (CABG) x 4, LIMA to LAD, SVG to OM1 and distal circ, SVG to PDA, using left internal mammary artery and right greater saphenous vein harvested endoscopically (N/A) TRANSESOPHAGEAL ECHOCARDIOGRAM (TEE)  Total Length of Stay:  LOS: 3 days   Subjective: Patient sitting in chair. He is passing a little flatus. No other specific complaints this am.  Objective: Vital signs in last 24 hours: Temp:  [98.5 F (36.9 C)-99.3 F (37.4 C)] 98.6 F (37 C) (12/05 0400) Pulse Rate:  [72-90] 86 (12/05 0600) Cardiac Rhythm: Normal sinus rhythm (12/04 2000) Resp:  [11-29] 24 (12/05 0600) BP: (96-137)/(66-86) 103/66 (12/05 0500) SpO2:  [87 %-99 %] 90 % (12/05 0600) Arterial Line BP: (113-144)/(59-84) 123/84 (12/04 1100) Weight:  [265 lb 14 oz (120.6 kg)] 265 lb 14 oz (120.6 kg) (12/05 0600)  Filed Weights   10/13/17 0539 10/14/17 0545 10/15/17 0600  Weight: 261 lb 12.8 oz (118.8 kg) 271 lb 11.2 oz (123.2 kg) 265 lb 14 oz (120.6 kg)    Weight change: -13.2 oz (-2.642 kg)   Hemodynamic parameters for last 24 hours: PAP: (28)/(8) 28/8 CO:  [5.8 L/min] 5.8 L/min CI:  [2.5 L/min/m2] 2.5 L/min/m2  Intake/Output from previous day: 12/04 0701 - 12/05 0700 In: 1441.9 [P.O.:840; I.V.:551.9; IV Piggyback:50] Out: 923 [Urine:695; Chest Tube:228]  Intake/Output this shift: No intake/output data recorded.  Current Meds: Scheduled Meds: . acetaminophen  1,000 mg Oral Q6H   Or  . acetaminophen (TYLENOL) oral liquid 160 mg/5 mL  1,000 mg Per Tube Q6H  . aspirin EC  325 mg Oral Daily   Or  . aspirin  324 mg Per Tube Daily  . bisacodyl  10 mg Oral Daily   Or  . bisacodyl  10 mg Rectal Daily  . docusate sodium  200 mg Oral Daily  . enoxaparin (LOVENOX) injection  30 mg Subcutaneous QHS  . ezetimibe   10 mg Oral q1800  . insulin aspart  0-24 Units Subcutaneous Q4H  . insulin regular  0-10 Units Intravenous TID WC  . mouth rinse  15 mL Mouth Rinse BID  . metoprolol tartrate  12.5 mg Oral BID   Or  . metoprolol tartrate  12.5 mg Per Tube BID  . pantoprazole  40 mg Oral Daily  . sodium chloride flush  3 mL Intravenous Q12H   Continuous Infusions: . sodium chloride    . sodium chloride    . sodium chloride Stopped (10/14/17 0800)  . dexmedetomidine (PRECEDEX) IV infusion Stopped (10/14/17 0515)  . insulin (NOVOLIN-R) infusion Stopped (10/14/17 1401)  . lactated ringers Stopped (10/14/17 0800)  . lactated ringers 20 mL/hr at 10/15/17 0600  . nitroGLYCERIN Stopped (10/14/17 1300)  . phenylephrine (NEO-SYNEPHRINE) Adult infusion Stopped (10/13/17 1652)   PRN Meds:.sodium chloride, fentaNYL (SUBLIMAZE) injection, ketorolac, metoprolol tartrate, midazolam, ondansetron (ZOFRAN) IV, oxyCODONE, sodium chloride flush, traMADol  General appearance: alert, cooperative and no distress Heart: RRR Lungs: Diminished at bases Abdomen: Soft, obese, non teneder, sporadic bowel sounds Extremities: Bilater LE edema Wounds: Aquacel intact on sternum. RLE dressing is clean and dry.  Lab Results: CBC: Recent Labs    10/14/17 1638 10/14/17 1645  10/15/17 0403  WBC 13.2*  --  11.6*  HGB 11.8* 12.2* 11.4*  HCT 35.2* 36.0* 33.5*  PLT 145*  --  128*   BMET:  Recent Labs    10/14/17 0411  10/14/17 1645 10/15/17 0403  NA 133*  --  134* 132*  K 4.2  --  4.0 3.8  CL 101  --  98* 97*  CO2 23  --   --  27  GLUCOSE 142*  --  133* 128*  BUN 12  --  16 19  CREATININE 0.98   < > 1.20 1.38*  CALCIUM 8.0*  --   --  8.4*   < > = values in this interval not displayed.    CMET: Lab Results  Component Value Date   WBC 11.6 (H) 10/15/2017   HGB 11.4 (L) 10/15/2017   HCT 33.5 (L) 10/15/2017   PLT 128 (L) 10/15/2017   GLUCOSE 128 (H) 10/15/2017   CHOL 264 (H) 10/12/2017   TRIG 126 10/12/2017   HDL  60 10/12/2017   LDLCALC 179 (H) 10/12/2017   ALT 20 10/13/2017   AST 46 (H) 10/13/2017   NA 132 (L) 10/15/2017   K 3.8 10/15/2017   CL 97 (L) 10/15/2017   CREATININE 1.38 (H) 10/15/2017   BUN 19 10/15/2017   CO2 27 10/15/2017   TSH 3.720 10/13/2017   INR 1.28 10/13/2017   HGBA1C 5.7 (H) 10/12/2017      PT/INR:  Recent Labs    10/13/17 1340  LABPROT 15.9*  INR 1.28   Radiology: No results found.   Assessment/Plan: S/P Procedure(s) (LRB): CORONARY ARTERY BYPASS GRAFTING (CABG) x 4, LIMA to LAD, SVG to OM1 and distal circ, SVG to PDA, using left internal mammary artery and right greater saphenous vein harvested endoscopically (N/A) TRANSESOPHAGEAL ECHOCARDIOGRAM (TEE)  1. CV-s/p inferior MI prior to surgery. Brief a fib in the OR.  SR int he 80's this am. On Lopressor 12.5 mg bid. 2. Pulmonary-Chest tube with 228 cc last 24 hours. Hope to remove soon. On 2 liters of oxygen via [place]. Wean to room air as tolerates. CXR this am appears to show no pneumothorax, bibasilar atelectasis and small pleural effusions. Encourage incentive spirometer and flutter valve. 3. Volume overload-Will give low dose Lasix IV today 4. DM-CBGs 104/113/128. On Insulin. Pre op HGA1C 5.7. Patient is likely pre diabetic. Inpatient diabetes coordinator has met with patient. I have included education about preventing diabetes with his discharge instructions. Also, he does have a medical doctor and he will need to follow up with him after discharge. Hopefully, with diet and exercise, can avoid DM. 5. ABL anemia-H and H decreased to 11.4 and 33.5 this am 6. Mild thrombocytopenia-platelets 128,000 7. Statin intolerant. On Zetia. Will need close follow up of lipid management by cardiology. 8. Creatinine increased to 1.38. Creatinine upon admission 1.15. Will monitor. 9. Remove foley 10. Supplement potassium 11. Hope to transfer to 4E soon  Nani Skillern PA-C 10/15/2017 7:42 AM   Plan to 4e , stable ,  d/c chest tube and foley I have seen and examined Scott Rosales and agree with the above assessment  and plan.  Grace Isaac MD [facility] [phone] Office [phone] 10/15/2017 8:32 AM

## 2017-10-15 NOTE — Discharge Instructions (Signed)
Coronary Artery Bypass Grafting, Care After °These instructions give you information on caring for yourself after your procedure. Your doctor may also give you more specific instructions. Call your doctor if you have any problems or questions after your procedure. °Follow these instructions at home: °· Only take medicine as told by your doctor. Take medicines exactly as told. Do not stop taking medicines or start any new medicines without talking to your doctor first. °· Take your pulse as told by your doctor. °· Do deep breathing as told by your doctor. Use your breathing device (incentive spirometer), if given, to practice deep breathing several times a day. Support your chest with a pillow or your arms when you take deep breaths or cough. °· Keep the area clean, dry, and protected where the surgery cuts (incisions) were made. Remove bandages (dressings) only as told by your doctor. If strips were applied to surgical area, do not take them off. They fall off on their own. °· Check the surgery area daily for puffiness (swelling), redness, or leaking fluid. °· If surgery cuts were made in your legs: °? Avoid crossing your legs. °? Avoid sitting for long periods of time. Change positions every 30 minutes. °? Raise your legs when you are sitting. Place them on pillows. °· Wear stockings that help keep blood clots from forming in your legs (compression stockings). °· Only take sponge baths until your doctor says it is okay to take showers. Pat the surgery area dry. Do not rub the surgery area with a washcloth or towel. Do not bathe, swim, or use a hot tub until your doctor says it is okay. °· Eat foods that are high in fiber. These include raw fruits and vegetables, whole grains, beans, and nuts. Choose lean meats. Avoid canned, processed, and fried foods. °· Drink enough fluids to keep your pee (urine) clear or pale yellow. °· Weigh yourself every day. °· Rest and limit activity as told by your doctor. You may be told  to: °? Stop any activity if you have chest pain, shortness of breath, changes in heartbeat, or dizziness. Get help right away if this happens. °? Move around often for short amounts of time or take short walks as told by your doctor. Gradually become more active. You may need help to strengthen your muscles and build endurance. °? Avoid lifting, pushing, or pulling anything heavier than 10 pounds (4.5 kg) for at least 6 weeks after surgery. °· Do not drive until your doctor says it is okay. °· Ask your doctor when you can go back to work. °· Ask your doctor when you can begin sexual activity again. °· Follow up with your doctor as told. °Contact a doctor if: °· You have puffiness, redness, more pain, or fluid draining from the incision site. °· You have a fever. °· You have puffiness in your ankles or legs. °· You have pain in your legs. °· You gain 2 or more pounds (0.9 kg) a day. °· You feel sick to your stomach (nauseous) or throw up (vomit). °· You have watery poop (diarrhea). °Get help right away if: °· You have chest pain that goes to your jaw or arms. °· You have shortness of breath. °· You have a fast or irregular heartbeat. °· You notice a "clicking" in your breastbone when you move. °· You have numbness or weakness in your arms or legs. °· You feel dizzy or light-headed. °This information is not intended to replace advice given to you by   your health care provider. Make sure you discuss any questions you have with your health care provider. Document Released: 11/02/2013 Document Revised: 04/04/2016 Document Reviewed: 04/06/2013 Elsevier Interactive Patient Education  2017 Elsevier Inc.   Preventing Type 2 Diabetes Mellitus Type 2 diabetes (type 2 diabetes mellitus) is a long-term (chronic) disease that affects blood sugar (glucose) levels. Normally, a hormone called insulin allows glucose to enter cells in the body. The cells use glucose for energy. In type 2 diabetes, one or both of these problems  may be present:  The body does not make enough insulin.  The body does not respond properly to insulin that it makes (insulin resistance).  Insulin resistance or lack of insulin causes excess glucose to build up in the blood instead of going into cells. As a result, high blood glucose (hyperglycemia) develops, which can cause many complications. Being overweight or obese and having an inactive (sedentary) lifestyle can increase your risk for diabetes. Type 2 diabetes can be delayed or prevented by making certain nutrition and lifestyle changes. What nutrition changes can be made?  Eat healthy meals and snacks regularly. Keep a healthy snack with you for when you get hungry between meals, such as fruit or a handful of nuts.  Eat lean meats and proteins that are low in saturated fats, such as chicken, fish, egg whites, and beans. Avoid processed meats.  Eat plenty of fruits and vegetables and plenty of grains that have not been processed (whole grains). It is recommended that you eat: ? 1?2 cups of fruit every day. ? 2?3 cups of vegetables every day. ? 6?8 oz of whole grains every day, such as oats, whole wheat, bulgur, brown rice, quinoa, and millet.  Eat low-fat dairy products, such as milk, yogurt, and cheese.  Eat foods that contain healthy fats, such as nuts, avocado, olive oil, and canola oil.  Drink water throughout the day. Avoid drinks that contain added sugar, such as soda or sweet tea.  Follow instructions from your health care provider about specific eating or drinking restrictions.  Control how much food you eat at a time (portion size). ? Check food labels to find out the serving sizes of foods. ? Use a kitchen scale to weigh amounts of foods.  Saute or steam food instead of frying it. Cook with water or broth instead of oils or butter.  Limit your intake of: ? Salt (sodium). Have no more than 1 tsp (2,400 mg) of sodium a day. If you have heart disease or high blood  pressure, have less than ? tsp (1,500 mg) of sodium a day. ? Saturated fat. This is fat that is solid at room temperature, such as butter or fat on meat. What lifestyle changes can be made?  Activity  Do moderate-intensity physical activity for at least 30 minutes on at least 5 days of the week, or as much as told by your health care provider.  Ask your health care provider what activities are safe for you. A mix of physical activities may be best, such as walking, swimming, cycling, and strength training.  Try to add physical activity into your day. For example: ? Park in spots that are farther away than usual, so that you walk more. For example, park in a far corner of the parking lot when you go to the office or the grocery store. ? Take a walk during your lunch break. ? Use stairs instead of elevators or escalators. Weight Loss  Lose weight as directed. Your  health care provider can determine how much weight loss is best for you and can help you lose weight safely.  If you are overweight or obese, you may be instructed to lose at least 5?7 % of your body weight. Alcohol and Tobacco   Limit alcohol intake to no more than 1 drink a day for nonpregnant women and 2 drinks a day for men. One drink equals 12 oz of beer, 5 oz of wine, or 1 oz of hard liquor.  Do not use any tobacco products, such as cigarettes, chewing tobacco, and e-cigarettes. If you need help quitting, ask your health care provider. Work With Your Health Care Provider  Have your blood glucose tested regularly, as told by your health care provider.  Discuss your risk factors and how you can reduce your risk for diabetes.  Get screening tests as told by your health care provider. You may have screening tests regularly, especially if you have certain risk factors for type 2 diabetes.  Make an appointment with a diet and nutrition specialist (registered dietitian). A registered dietitian can help you make a healthy  eating plan and can help you understand portion sizes and food labels. Why are these changes important?  It is possible to prevent or delay type 2 diabetes and related health problems by making lifestyle and nutrition changes.  It can be difficult to recognize signs of type 2 diabetes. The best way to avoid possible damage to your body is to take actions to prevent the disease before you develop symptoms. What can happen if changes are not made?  Your blood glucose levels may keep increasing. Having high blood glucose for a long time is dangerous. Too much glucose in your blood can damage your blood vessels, heart, kidneys, nerves, and eyes.  You may develop prediabetes or type 2 diabetes. Type 2 diabetes can lead to many chronic health problems and complications, such as: ? Heart disease. ? Stroke. ? Blindness. ? Kidney disease. ? Depression. ? Poor circulation in the feet and legs, which could lead to surgical removal (amputation) in severe cases. Where to find support:  Ask your health care provider to recommend a registered dietitian, diabetes educator, or weight loss program.  Look for local or online weight loss groups.  Join a gym, fitness club, or outdoor activity group, such as a walking club. Where to find more information: To learn more about diabetes and diabetes prevention, visit:  American Diabetes Association (ADA): www.diabetes.[employer]  National Institute of Diabetes and Digestive and Kidney Diseases: ToyArticles.cawww.niddk.nih.gov/health-information/diabetes  To learn more about healthy eating, visit:  The U.S. Department of Agriculture Architect(USDA), Choose My Plate: http://yates.biz/www.choosemyplate.gov/food-groups  Office of Disease Prevention and Health Promotion (ODPHP), Dietary Guidelines: ListingMagazine.siwww.health.gov/dietaryguidelines  Summary  You can reduce your risk for type 2 diabetes by increasing your physical activity, eating healthy foods, and losing weight as directed.  Talk with your health care  provider about your risk for type 2 diabetes. Ask about any blood tests or screening tests that you need to have. This information is not intended to replace advice given to you by your health care provider. Make sure you discuss any questions you have with your health care provider. Document Released: 02/19/2016 Document Revised: 04/04/2016 Document Reviewed: 12/19/2015 Elsevier Interactive Patient Education  [employer]2018 Elsevier Inc.

## 2017-10-15 NOTE — Care Management Note (Signed)
Case Management Note Donn PieriniKristi Winn Muehl RN, BSN Unit 4E-Case Manager-- 2H coverage [phone]  Patient Details  Name: Scott Rosales MRN: [id number] Date of Birth: 02/06/1961  Subjective/Objective:   Pt admitted with NSTEMI- MVD found- s/p CABG x4 on 12/3                 Action/Plan: PTA pt lived at home- independent- has supportive family- anticipate return home- CM to follow for transition of care needs.   Expected Discharge Date:                  Expected Discharge Plan:  Home/Self Care  In-House Referral:     Discharge planning Services  CM Consult  Post Acute Care Choice:    Choice offered to:     DME Arranged:    DME Agency:     HH Arranged:    HH Agency:     Status of Service:  In process, will continue to follow  If discussed at Long Length of Stay Meetings, dates discussed:    Additional Comments:  Darrold SpanWebster, Tymon Nemetz Hall, RN 10/15/2017, 12:13 PM

## 2017-10-16 ENCOUNTER — Inpatient Hospital Stay (HOSPITAL_COMMUNITY): Payer: 59

## 2017-10-16 ENCOUNTER — Encounter (HOSPITAL_COMMUNITY): Payer: Self-pay | Admitting: *Deleted

## 2017-10-16 DIAGNOSIS — E785 Hyperlipidemia, unspecified: Secondary | ICD-10-CM

## 2017-10-16 HISTORY — DX: Hyperlipidemia, unspecified: E78.5

## 2017-10-16 LAB — CBC
HCT: 33.7 % — ABNORMAL LOW (ref 39.0–52.0)
HEMOGLOBIN: 11.4 g/dL — AB (ref 13.0–17.0)
MCH: 30.4 pg (ref 26.0–34.0)
MCHC: 33.8 g/dL (ref 30.0–36.0)
MCV: 89.9 fL (ref 78.0–100.0)
Platelets: 154 10*3/uL (ref 150–400)
RBC: 3.75 MIL/uL — ABNORMAL LOW (ref 4.22–5.81)
RDW: 14.3 % (ref 11.5–15.5)
WBC: 10 10*3/uL (ref 4.0–10.5)

## 2017-10-16 LAB — BASIC METABOLIC PANEL
ANION GAP: 7 (ref 5–15)
BUN: 16 mg/dL (ref 6–20)
CALCIUM: 8.4 mg/dL — AB (ref 8.9–10.3)
CO2: 29 mmol/L (ref 22–32)
Chloride: 100 mmol/L — ABNORMAL LOW (ref 101–111)
Creatinine, Ser: 1.26 mg/dL — ABNORMAL HIGH (ref 0.61–1.24)
GFR calc Af Amer: >60 mL/min (ref >60)
GLUCOSE: 122 mg/dL — AB (ref 65–99)
Potassium: 3.9 mmol/L (ref 3.5–5.1)
Sodium: 136 mmol/L (ref 135–145)

## 2017-10-16 LAB — GLUCOSE, CAPILLARY: GLUCOSE-CAPILLARY: 91 mg/dL (ref 65–99)

## 2017-10-16 MED ORDER — FUROSEMIDE 40 MG PO TABS
40.0000 mg | ORAL_TABLET | Freq: Every day | ORAL | Status: DC
  Administered 2017-10-16 – 2017-10-17 (×2): 40 mg via ORAL
  Filled 2017-10-16 (×2): qty 1

## 2017-10-16 MED ORDER — CLOPIDOGREL BISULFATE 75 MG PO TABS
75.0000 mg | ORAL_TABLET | Freq: Every day | ORAL | Status: DC
  Administered 2017-10-16 – 2017-10-17 (×2): 75 mg via ORAL
  Filled 2017-10-16 (×2): qty 1

## 2017-10-16 MED ORDER — ASPIRIN EC 81 MG PO TBEC
81.0000 mg | DELAYED_RELEASE_TABLET | Freq: Every day | ORAL | Status: DC
  Administered 2017-10-16 – 2017-10-17 (×2): 81 mg via ORAL
  Filled 2017-10-16 (×2): qty 1

## 2017-10-16 MED ORDER — LACTULOSE 10 GM/15ML PO SOLN: 20.0000 g | Freq: Once | ORAL | Status: DC

## 2017-10-16 MED ORDER — MOVING RIGHT ALONG BOOK
Freq: Once | Status: AC
  Administered 2017-10-16: 13:00:00
  Filled 2017-10-16: qty 1

## 2017-10-16 MED ORDER — LACTULOSE 10 GM/15ML PO SOLN
20.0000 g | Freq: Once | ORAL | Status: AC
  Administered 2017-10-16: 20 g via ORAL
  Filled 2017-10-16: qty 30

## 2017-10-16 MED ORDER — POTASSIUM CHLORIDE CRYS ER 20 MEQ PO TBCR
40.0000 meq | EXTENDED_RELEASE_TABLET | Freq: Once | ORAL | Status: AC
  Administered 2017-10-16: 40 meq via ORAL
  Filled 2017-10-16: qty 2

## 2017-10-16 MED ORDER — LIVING WELL WITH DIABETES BOOK
Freq: Once | Status: AC
  Administered 2017-10-16: 13:00:00
  Filled 2017-10-16: qty 1

## 2017-10-16 NOTE — Progress Notes (Signed)
Pt prefers to wait for daughter to be at bedside for EPW removal. Pt verbalized understanding that wires must be pulled before 2pm. Pt agreeable.   Leonidas Rombergaitlin S Bumbledare, RN

## 2017-10-16 NOTE — Progress Notes (Addendum)
[address]       [place] [address]             [phone]        3 Days Post-Op Procedure(s) (LRB): CORONARY ARTERY BYPASS GRAFTING (CABG) x 4, LIMA to LAD, SVG to OM1 and distal circ, SVG to PDA, using left internal mammary artery and right greater saphenous vein harvested endoscopically (N/A) TRANSESOPHAGEAL ECHOCARDIOGRAM (TEE)  Subjective: Patient without specific complaints this am except no bowel movement yet.  Objective: Vital signs in last 24 hours: Temp:  [97.8 F (36.6 C)-99.8 F (37.7 C)] 98.7 F (37.1 C) (12/06 0323) Pulse Rate:  [86-92] 91 (12/06 0323) Cardiac Rhythm: Normal sinus rhythm;Bundle branch block (12/05 1900) Resp:  [19-30] 23 (12/06 0323) BP: (111-140)/(69-87) 129/83 (12/06 0323) SpO2:  [92 %-100 %] 100 % (12/06 0323) Weight:  [268 lb 3.2 oz (121.7 kg)] 268 lb 3.2 oz (121.7 kg) (12/06 0323)  Pre op weight 118.8 kg Current Weight  10/16/17 268 lb 3.2 oz (121.7 kg)       Intake/Output from previous day: 12/05 0701 - 12/06 0700 In: 300 [P.O.:300] Out: 1300 [Urine:1300]   Physical Exam:  Cardiovascular: RRR Pulmonary: Slightly diminished at bases Abdomen: Soft, non tender, bowel sounds present. Extremities: Bilateral lower extremity edema. Wounds: Aquacel removed and sternal wound is clean and dry.  No erythema or signs of infection. RLE dressing removed and wound is clean and dry.  Lab Results: CBC: Recent Labs    10/15/17 0403 10/16/17 0353  WBC 11.6* 10.0  HGB 11.4* 11.4*  HCT 33.5* 33.7*  PLT 128* 154   BMET:  Recent Labs    10/15/17 0403 10/16/17 0353  NA 132* 136  K 3.8 3.9  CL 97* 100*  CO2 27 29  GLUCOSE 128* 122*  BUN 19 16  CREATININE 1.38* 1.26*  CALCIUM 8.4* 8.4*    PT/INR:  Lab Results  Component Value Date   INR 1.28 10/13/2017   INR 0.99 10/12/2017   INR 1.01 10/12/2017   ABG:  INR: Will add last result for INR, ABG once components are confirmed Will add last 4 CBG  results once components are confirmed  Assessment/Plan:  1. CV-s/p inferior MI prior to surgery. Brief a fib in the OR.  SR int he 80's this am. On Lopressor 12.5 mg bid. Will discuss with Dr. Wynonia Lawman if should be on Plavix secondary to pre op MI. 2. Pulmonary-On room air. CXR this am appears to show no pneumothorax, bibasilar atelectasis and small pleural effusions. Encourage incentive spirometer and flutter valve. 3. Volume overload-Will give Lasix 40 mg daily 4. DM-CBGs 107/82/91. On Insulin. Pre op HGA1C 5.7. Will stop accu checks and SS. Patient is likely pre diabetic. Inpatient diabetes coordinator has met with patient. I have included education about preventing diabetes with his discharge instructions. Also, he does have a medical doctor and he will need to follow up with him after discharge. Hopefully, with diet and exercise, can avoid DM. 5. ABL anemia-H and H stable at 11.4 and 33.7 this am 6. Mild thrombocytopenia resolved -platelets up to 154,000 7. Statin intolerant. On Zetia. Will need close follow up of lipid management by cardiology. 8. Creatinine decreased to 1.26. Creatinine upon admission 1.15.  9. Remove EPW 10. LOC constipation 11. Supplement potassium 12. Likely discharge Saturday   Eris Hannan M ZimmermanPA-C 10/16/2017,7:19 AM

## 2017-10-16 NOTE — Progress Notes (Signed)
CARDIAC REHAB PHASE I   PRE:  Rate/Rhythm: 97 SR  BP:  Sitting: 116/77        SaO2: 94 RA  MODE:  Ambulation: 470 ft   POST:  Rate/Rhythm: 107 ST  BP:  Sitting: 131/85         SaO2: 92 RA  Pt ambulated 470 ft on RA, independent, steady gait, tolerated well with no complaints. Cardiac surgery discharge education completed with pt and wife at bedside. Reviewed risk factors, IS, sternal precautions, activity progression, exercise, heart healthy and diabetes diet handouts, daily weights and phase 2 cardiac rehab. Pt and wife verbalized understanding, receptive to education. Pt agrees to phase 2 cardiac rehab referral, [facility]. Encouraged IS, flutter valve, additional ambulation x2 today. Pt to recliner after walk, feet elevated, call bell within reach. Will follow.   [phone] Joylene GrapesEmily C Sondra Blixt, RN, BSN 10/16/2017 9:50 AM

## 2017-10-16 NOTE — Op Note (Signed)
NAME:  Rosario AdieBROOKS, Arick                     ACCOUNT NO.:  MEDICAL RECORD NO.:  [id number]  LOCATION:                                 FACILITY:  PHYSICIAN:  Sheliah PlaneEdward Jairy Angulo, MD         DATE OF BIRTH:  DATE OF PROCEDURE:  10/13/2017 DATE OF DISCHARGE:                              OPERATIVE REPORT   PREOPERATIVE DIAGNOSIS:  Recent, less than 48-hour inferior myocardial infarction.  POSTOPERATIVE DIAGNOSIS:  Recent, less than 48-hour inferior myocardial infarction.  SURGICAL PROCEDURE:  Coronary artery bypass grafting x4 with the left internal mammary to the left anterior descending coronary artery, sequential reversed saphenous vein graft to the first obtuse marginal and circumflex, reversed saphenous vein graft to the posterior descending coronary artery with right greater saphenous thigh and calf endo vein harvesting.  SURGEON:  Sheliah PlaneEdward Alyn Riedinger, MD.  FIRST ASSISTANT:  Jari Favreessa Conte, [place].  BRIEF HISTORY:  The patient is a 56 year old male with no previous history of smoking, but with known hyperlipidemia, intolerant of statins, recent LDL greater than 170, who presented with several-day history of substernal chest pain [place].  On Friday, he had discomfort all day and [facility] discomfort and [facility].  Initial troponin was elevated.  Second troponin was at 4. The patient was stabilized medically and ultimately underwent cardiac catheterization by Dr. Herbie BaltimoreHarding and consultation by Dr. Donnie Ahoilley.  The patient with medical therapy had become pain free.  Cardiac catheterization demonstrated complex high-grade stenosis greater than 80% involving the circumflex and a large first obtuse marginal.  The right coronary artery had luminal irregularities and an acute occlusion of the posterior descending coronary artery.  In addition, the  patient had serial proximal and mid LAD lesions.  Flow wire was measured these to be significant.  Overall, ventricular function was preserved with some inferior hypokinesis.  Because of the patient's significant coronary artery disease in addition to the small totally occluded diagonal, coronary artery bypass grafting was recommended to the patient.  He agreed and signed informed consent.  DESCRIPTION OF PROCEDURE:  With Swan-Ganz and arterial line monitors in place, the patient underwent general endotracheal anesthesia without incident.  Skin of chest and legs was prepped with Betadine and draped in usual sterile manner.  Appropriate time-out was performed.  Using the Guidant endo vein harvesting system, the segment of reversed saphenous vein graft was harvested from the right thigh and calf.  The vein was of reasonable size and quality.  Median sternotomy was performed.  Left internal mammary artery was dissected down as a pedicle graft.  The distal artery was divided and had good free flow.  The vessel was hydrostatically dilated with heparinized saline.  Pericardium was opened.  Overall ventricular function was preserved.  A TEE probe had also been placed that showed normal valvular function and an intact LV function.  The patient was systemically heparinized.  Ascending aorta was cannulated.  The right atrium was cannulated.  An aortic root vent cardioplegia needle was introduced into the ascending aorta.  The patient was placed on cardiopulmonary bypass at 2.4 L/min/m2.  Sites of anastomosis were selected and dissected out of the epicardium.  On examination of the heart, there was area of myocardial necrosis on the inferior surface of the heart in the distribution of the small posterior descending, though the posterior descending vessel was large enough to bypass, but was supplied a relatively small amount of myocardium.  The patient was cooled to 32 degrees.  Aortic crossclamp  was applied and 600 mL of cold blood potassium cardioplegia was administered with diastolic arrest of the heart.  Myocardial septal temperature was monitored throughout the cross-clamp.  Attention was turned first to the posterior descending.  This vessel was relatively small, was opened, admitted a 1- mm probe distally.  The vessel was 1.2 mm in size.  Using a running 7-0 Prolene, distal anastomosis was performed with a segment reversed saphenous vein graft.  Additional cold blood cardioplegia was administered down the vein graft.  The heart was then elevated and the first obtuse marginal artery was opened.  This vessel was 1.5 mm in size.  Using a diamond-type, side-to-side anastomosis was carried out with a segment of reversed saphenous vein graft.  The distal extent of the same vein was then carried to the slightly larger circumflex branch. This was opened and it was 1.5-1.8 mm in size.  Using a running 7-0 Prolene, distal anastomosis was performed.  Attention was then turned to the left anterior descending coronary artery between the mid and distal 3rd.  The LAD was opened and admitted a 1.5-mm probe distally.  Using a running 8-0 Prolene, left internal mammary artery was anastomosed to the left anterior descending coronary artery.  With release of the bulldog on the mammary artery, there was rise in myocardial septal temperature. The bulldog was placed back on the mammary artery.  Additional cold blood cardioplegia was administered.  We then with the cross-clamp still in place performed 2 punch aortotomies in the ascending aorta and each of the 2 vein grafts were anastomosed to the ascending aorta.  The body temperature was rewarmed to 37 degrees.  The bulldog on the mammary artery was removed with rise in myocardial septal temperature.  The heart was allowed to passively fill and de-air and the proximal anastomoses were completed.  The aortic crossclamp was removed with a total  crossclamp time of 79 minutes.  The patient required electrical defibrillation, turned to a sinus rhythm.  Sites of anastomosis were inspected and free of bleeding.  Atrial and ventricular pacing wires were applied.  The patient was atrially paced to increase his rate.  He had been started on an amiodarone drip as he had some brief atrial fibrillation when we were initially cannulating.  He remained in sinus rhythm.  He was ventilated and weaned cardiopulmonary bypass without difficulty.  He was decannulated in usual fashion.  Protamine sulfate was administered with operative field hemostatic.  Atrial and ventricular pacing wires had been applied.  Graft markers were applied. A left pleural tube and a Blake mediastinal drain were left in place. The pericardium was loosely reapproximated.  Sternum was closed with #6 stainless steel wire.  Fascia was closed with interrupted 0 Vicryl, running 3-0 Vicryl in subcutaneous tissue, 4-0 subcuticular stitch in skin edges.  Dry dressings were applied.  Sponge and needle count was reported as correct at the completion of procedure.  The patient tolerated the procedure without obvious complication.  Total pump time was 103 minutes.  As we were  closing the skin incision, there was a small skin tag in the midportion of the incision that would have interfered with the healing of the wound.  The small skin tag was removed and submitted to Pathology for examinations.  The total pump time was 103 minutes.  The patient did not require any blood bank blood products during the operative procedure.     Sheliah PlaneEdward Wiley Flicker, MD     EG/MEDQ  D:  10/15/2017  T:  10/16/2017  Job:  [id number]

## 2017-10-17 MED ORDER — POTASSIUM CHLORIDE CRYS ER 20 MEQ PO TBCR
20.0000 meq | EXTENDED_RELEASE_TABLET | Freq: Every day | ORAL | Status: DC
  Administered 2017-10-17: 20 meq via ORAL
  Filled 2017-10-17: qty 1

## 2017-10-17 MED ORDER — LISINOPRIL 2.5 MG PO TABS: 2.5000 mg | ORAL_TABLET | Freq: Every day | ORAL | 1 refills | Status: DC

## 2017-10-17 MED ORDER — METOPROLOL TARTRATE 25 MG PO TABS: 25.0000 mg | ORAL_TABLET | Freq: Two times a day (BID) | ORAL | 1 refills | Status: DC

## 2017-10-17 MED ORDER — OXYCODONE HCL 5 MG PO TABS: ORAL_TABLET | ORAL | 0 refills | Status: DC

## 2017-10-17 MED ORDER — METOPROLOL TARTRATE 25 MG/10 ML ORAL SUSPENSION: 25.0000 mg | Freq: Two times a day (BID) | ORAL | Status: DC

## 2017-10-17 MED ORDER — POTASSIUM CHLORIDE CRYS ER 20 MEQ PO TBCR: 20.0000 meq | EXTENDED_RELEASE_TABLET | Freq: Every day | ORAL | 0 refills | Status: DC

## 2017-10-17 MED ORDER — METOPROLOL TARTRATE 25 MG PO TABS: 12.5000 mg | ORAL_TABLET | Freq: Two times a day (BID) | ORAL | 1 refills | Status: DC

## 2017-10-17 MED ORDER — LISINOPRIL 2.5 MG PO TABS: 2.5000 mg | ORAL_TABLET | Freq: Every day | ORAL | Status: DC

## 2017-10-17 MED ORDER — METOPROLOL TARTRATE 25 MG PO TABS: 25.0000 mg | ORAL_TABLET | Freq: Two times a day (BID) | ORAL | Status: DC

## 2017-10-17 MED ORDER — CLOPIDOGREL BISULFATE 75 MG PO TABS: 75.0000 mg | ORAL_TABLET | Freq: Every day | ORAL | 1 refills | Status: DC

## 2017-10-17 MED ORDER — EZETIMIBE 10 MG PO TABS: 10.0000 mg | ORAL_TABLET | Freq: Every day | ORAL | 1 refills | Status: DC

## 2017-10-17 MED ORDER — FUROSEMIDE 40 MG PO TABS: 40.0000 mg | ORAL_TABLET | Freq: Every day | ORAL | 0 refills | Status: DC

## 2017-10-17 NOTE — Care Management Note (Signed)
Case Management Note Donn PieriniKristi Kavan Devan RN, BSN Unit 4E-Case Manager-- 2H coverage [phone]  Patient Details  Name: [name] Drew Meckler MRN: [id number] Date of Birth: 11/01/1961  Subjective/Objective:   Pt admitted with NSTEMI- MVD found- s/p CABG x4 on 12/3                 Action/Plan: PTA pt lived at home- independent- has supportive family- anticipate return home- CM to follow for transition of care needs.   Expected Discharge Date:  10/17/17               Expected Discharge Plan:  Home/Self Care  In-House Referral:     Discharge planning Services  CM Consult  Post Acute Care Choice:  NA Choice offered to:  NA  DME Arranged:    DME Agency:     HH Arranged:    HH Agency:     Status of Service:  Completed, signed off  If discussed at [employer] Length of Stay Meetings, dates discussed:    Discharge Disposition: home/self care   Additional Comments:  10/17/17- 1245- Candee Hoon RN, CM- pt for d/c home today- no further CM needs noted for discharge.   Darrold SpanWebster, Shyra Emile Hall, RN 10/17/2017, 12:44 PM

## 2017-10-17 NOTE — Progress Notes (Signed)
Discussed with the patient and all questioned fully answered. He will call me if any problems arise.  Paper Rx given. Sutures removed. IV removed. Telemetry removed.   Leonidas Rombergaitlin S Bumbledare, RN

## 2017-10-17 NOTE — Progress Notes (Signed)
      [address]       [place] [id number]             [phone]        4 Days Post-Op Procedure(s) (LRB): CORONARY ARTERY BYPASS GRAFTING (CABG) x 4, LIMA to LAD, SVG to OM1 and distal circ, SVG to PDA, using left internal mammary artery and right greater saphenous vein harvested endoscopically (N/A) TRANSESOPHAGEAL ECHOCARDIOGRAM (TEE)  Subjective: Patient has had a bowel movement. He hopes to go home today.  Objective: Vital signs in last 24 hours: Temp:  [97.5 F (36.4 C)-99.3 F (37.4 C)] 98.5 F (36.9 C) (12/07 0532) Pulse Rate:  [87-97] 95 (12/07 0532) Cardiac Rhythm: Normal sinus rhythm (12/06 1900) Resp:  [20-31] 22 (12/06 1954) BP: (125-137)/(79-108) 137/79 (12/06 1954) SpO2:  [95 %-100 %] 100 % (12/07 0532) Weight:  [265 lb 9.6 oz (120.5 kg)] 265 lb 9.6 oz (120.5 kg) (12/07 0532)  Pre op weight 118.8 kg Current Weight  10/17/17 265 lb 9.6 oz (120.5 kg)      Intake/Output from previous day: 12/06 0701 - 12/07 0700 In: 680 [P.O.:680] Out: -    Physical Exam:  Cardiovascular: RRR Pulmonary: Slightly diminished at bases Abdomen: Soft, non tender, bowel sounds present. Extremities: Bilateral lower extremity edema. Wounds: All wounds are clean and dry. No sign of infection.  Lab Results: CBC: Recent Labs    10/15/17 0403 10/16/17 0353  WBC 11.6* 10.0  HGB 11.4* 11.4*  HCT 33.5* 33.7*  PLT 128* 154   BMET:  Recent Labs    10/15/17 0403 10/16/17 0353  NA 132* 136  K 3.8 3.9  CL 97* 100*  CO2 27 29  GLUCOSE 128* 122*  BUN 19 16  CREATININE 1.38* 1.26*  CALCIUM 8.4* 8.4*    PT/INR:  Lab Results  Component Value Date   INR 1.28 10/13/2017   INR 0.99 10/12/2017   INR 1.01 10/12/2017   ABG:  INR: Will add last result for INR, ABG once components are confirmed Will add last 4 CBG results once components are confirmed  Assessment/Plan:  1. CV-s/p inferior MI prior to surgery. Brief a fib in the OR.  SR int he  80's this am. On Lopressor 12.5 mg bid. Will discuss with Dr. Donnie Ahoilley if should be on Plavix secondary to pre op MI. 2. Pulmonary-On room air. Encourage incentive spirometer and flutter valve. 3. Volume overload-Will give Lasix 40 mg daily 4. ABL anemia-H and H stable at 11.4 and 33.7 this am 5. Mild thrombocytopenia resolved -platelets up to 154,000 6. Statin intolerant. On Zetia. Will need close follow up of lipid management by cardiology. 7. Likely discharge later today  Donielle M ZimmermanPA-C 10/17/2017,7:09 AM

## 2017-10-17 NOTE — Progress Notes (Signed)
CARDIAC REHAB PHASE I   PRE:  Rate/Rhythm: 91 SR  BP:  Sitting: 135/92        SaO2: 96 RA  MODE:  Ambulation: 470 ft   POST:  Rate/Rhythm: 100 ST  BP:  Sitting: 140/86         SaO2: 99 RA  Pt ambulated 470 ft on RA, independent, steady gait, tolerated well with no complaints. Pt and wife state they have no questions regarding education. Phase 2 [facility]. Pt to edge of bed per pt request after walk, call bell within reach. Pt hopeful for discharge today.  [phone] Joylene GrapesEmily C Tonja Jezewski, RN, BSN 10/17/2017 10:34 AM

## 2017-10-22 MED FILL — Tirofiban HCl in NaCl 0.9% IV Soln 5 MG/100ML (Base Equiv): INTRAVENOUS | Qty: 100 | Status: AC

## 2017-11-20 ENCOUNTER — Ambulatory Visit: Payer: 59 | Admitting: Cardiothoracic Surgery

## 2017-11-24 ENCOUNTER — Ambulatory Visit (INDEPENDENT_AMBULATORY_CARE_PROVIDER_SITE_OTHER): Payer: Self-pay | Admitting: Physician Assistant

## 2017-11-24 ENCOUNTER — Other Ambulatory Visit: Payer: Self-pay | Admitting: Cardiothoracic Surgery

## 2017-11-24 ENCOUNTER — Other Ambulatory Visit: Payer: Self-pay

## 2017-11-24 ENCOUNTER — Ambulatory Visit
Admission: RE | Admit: 2017-11-24 | Discharge: 2017-11-24 | Disposition: A | Discharge status: Home / Self Care | Payer: 59 | Source: Ambulatory Visit | Attending: Cardiothoracic Surgery | Admitting: Cardiothoracic Surgery

## 2017-11-24 VITALS — BP 148/96 | HR 66 | Resp 18 | Ht 70.0 in | Wt 260.4 lb

## 2017-11-24 DIAGNOSIS — Z951 Presence of aortocoronary bypass graft: Secondary | ICD-10-CM

## 2017-11-24 MED ORDER — TRAMADOL HCL 50 MG PO TABS: 50.0000 mg | ORAL_TABLET | Freq: Four times a day (QID) | ORAL | 0 refills | Status: DC | PRN

## 2017-11-24 NOTE — Patient Instructions (Signed)
Make every effort to keep your diabetes under very tight control.  Follow up closely with your primary care physician or endocrinologist and strive to keep their hemoglobin A1c levels as low as possible, preferably near or below 6.0.  The long term benefits of strict control of diabetes are far reaching and critically important for your overall health and survival.  You may return to driving an automobile as long as you are no longer requiring oral narcotic pain relievers during the daytime.  It would be wise to start driving only short distances during the daylight and gradually increase from there as you feel comfortable.  Make every effort to stay physically active, get some type of exercise on a regular basis, and stick to a "heart healthy diet".  The long term benefits for regular exercise and a healthy diet are critically important to your overall health and wellbeing.  You are encouraged to enroll and participate in the outpatient cardiac rehab program beginning as soon as practical. 

## 2017-11-24 NOTE — Progress Notes (Signed)
Scott Rosales is a 57 y.o. male patient who is s/p CABG x 4 on 10/13/2017 he is here for his routine 4 week follow-up visit.     1. S/P CABG x 4    Past Medical History:  Diagnosis Date  . Hyperlipidemia 10/16/2017   Scheduled Meds: Current Outpatient Medications on File Prior to Visit  Medication Sig Dispense Refill  . aspirin EC 81 MG tablet Take 81 mg by mouth daily.    . clopidogrel (PLAVIX) 75 MG tablet Take 1 tablet (75 mg total) by mouth daily. 30 tablet 1  . ezetimibe (ZETIA) 10 MG tablet Take 1 tablet (10 mg total) by mouth daily at 6 PM. 30 tablet 1  . furosemide (LASIX) 40 MG tablet Take 1 tablet (40 mg total) by mouth daily. For 5 days then stop. 5 tablet 0  . metoprolol tartrate (LOPRESSOR) 25 MG tablet Take 1 tablet (25 mg total) by mouth 2 (two) times daily. 60 tablet 1  . oxyCODONE (OXY IR/ROXICODONE) 5 MG immediate release tablet Take 5 mg by mouth every 4-6 hours PRN severe pain 30 tablet 0  . potassium chloride SA (K-DUR,KLOR-CON) 20 MEQ tablet Take 1 tablet (20 mEq total) by mouth daily. For 5 days then stop. 5 tablet 0   No current facility-administered medications on file prior to visit.      Blood pressure (!) 148/96, pulse 66, resp. rate 18, height _0  (1.778 m), weight 260 lb 6.4 oz (118.1 kg), SpO2 98 %.  Subjective [name] presents today status post CABG x4 on 10/13/2017 for his routine 4-week follow-up visit.  Objective  Cor:RRR, no murmur Pulm:CTA bilaterally and in all fields Abd: no tenderness Ext: 1+ nonpitting pedal edema in the right lower extremity Wound:sternal incision was c/d/i, right EVH site was c/d/i    CLINICAL DATA:  Status post CABG  EXAM: CHEST  2 VIEW  COMPARISON:  10/16/2017 radiograph  FINDINGS: Status post sternotomy. Interim clearing of small pleural effusions and bibasilar atelectasis. No focal pulmonary opacity. Cardiomediastinal silhouette within normal limits. No pneumothorax.  IMPRESSION: 1. Post  sternotomy changes 2. Interim clearing of previously noted small pleural effusions and bibasilar atelectasis. Lung fields are clear on the current exam.   Electronically Signed   By: Donavan Foil M.D.   On: 11/24/2017 13:57   Assessment & Plan  Mr. Kniss presents today with a myriad of complaints.  He has had wrist pain and tenderness which turned into swelling.  This has since resolved on its own.  The patient did take some ibuprofen which I have not recommended.  He now has bilateral shoulder pain which is located near the top of his deltoid muscle.  He has point tenderness.  He is having difficulty with external rotation.  I have suggested ice and heat therapy for relief.  I have also suggested not continuing ibuprofen and instead taking Tylenol since he is post coronary bypass surgery.  I have provided him with a prescription for Ultram since he does not like to take his oxycodone due to the side effects.  If his pain does not resolve he may need a orthopedic referral.  He still has some numbness in his right lower extremity below the vein harvest site and some numbness on his left chest wall.  This should resolve in time.  He also notes having pain in his left groin which radiates down his IT band.  This comes and goes.  He is asking when he can  drive which I stated that I would wait another week or 2 since he is having all of this pain in various locations.  He is also asking when he can go back to work.  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  I suggested 8 weeks from his surgery date since his job is very active.  We discussed cardiac rehab which he is still deciding on.  He lives near [place] and coming to [place] would be a far drive for 3 days a week.  He has an appointment with Dr. Wynonia Lawman on Wednesday.  His blood pressure was slightly elevated during his visit today, but when he takes it at home it has been running 120/70.  I did not add lisinopril today.  The  patient wanted to stop his Zetia due to the myalgias he has been feeling.  He did have an intolerance to statin therapy as well.  He mentioned that Dr. Wynonia Lawman was going to start him on an injection for his cholesterol.  At this time he had no other questions or concerns.  He is to follow-up in our office as needed.  I discussed potential follow-up with the patient if his various ailments do not resolve with the suggested therapy.  He will also need to follow-up with his primary care provider for his blood glucose level checks.  He was noted to be in the prediabetic range while in the hospital and requiring insulin.  I reviewed the chest x-ray with the patient and he had no further questions.  At this time he had no other questions or concerns. Follow-up PRN.  Elgie Collard 11/24/2017

## 2017-12-26 DIAGNOSIS — R7303 Prediabetes: Secondary | ICD-10-CM

## 2017-12-26 HISTORY — DX: Prediabetes: R73.03

## 2018-04-03 IMAGING — DX DG CHEST 1V PORT
1 series · 1 of 1 positions shown · non-contrast
Comparison: 10/12/2017

CLINICAL DATA: CABG

EXAM:
PORTABLE CHEST 1 VIEW

[chest ap]
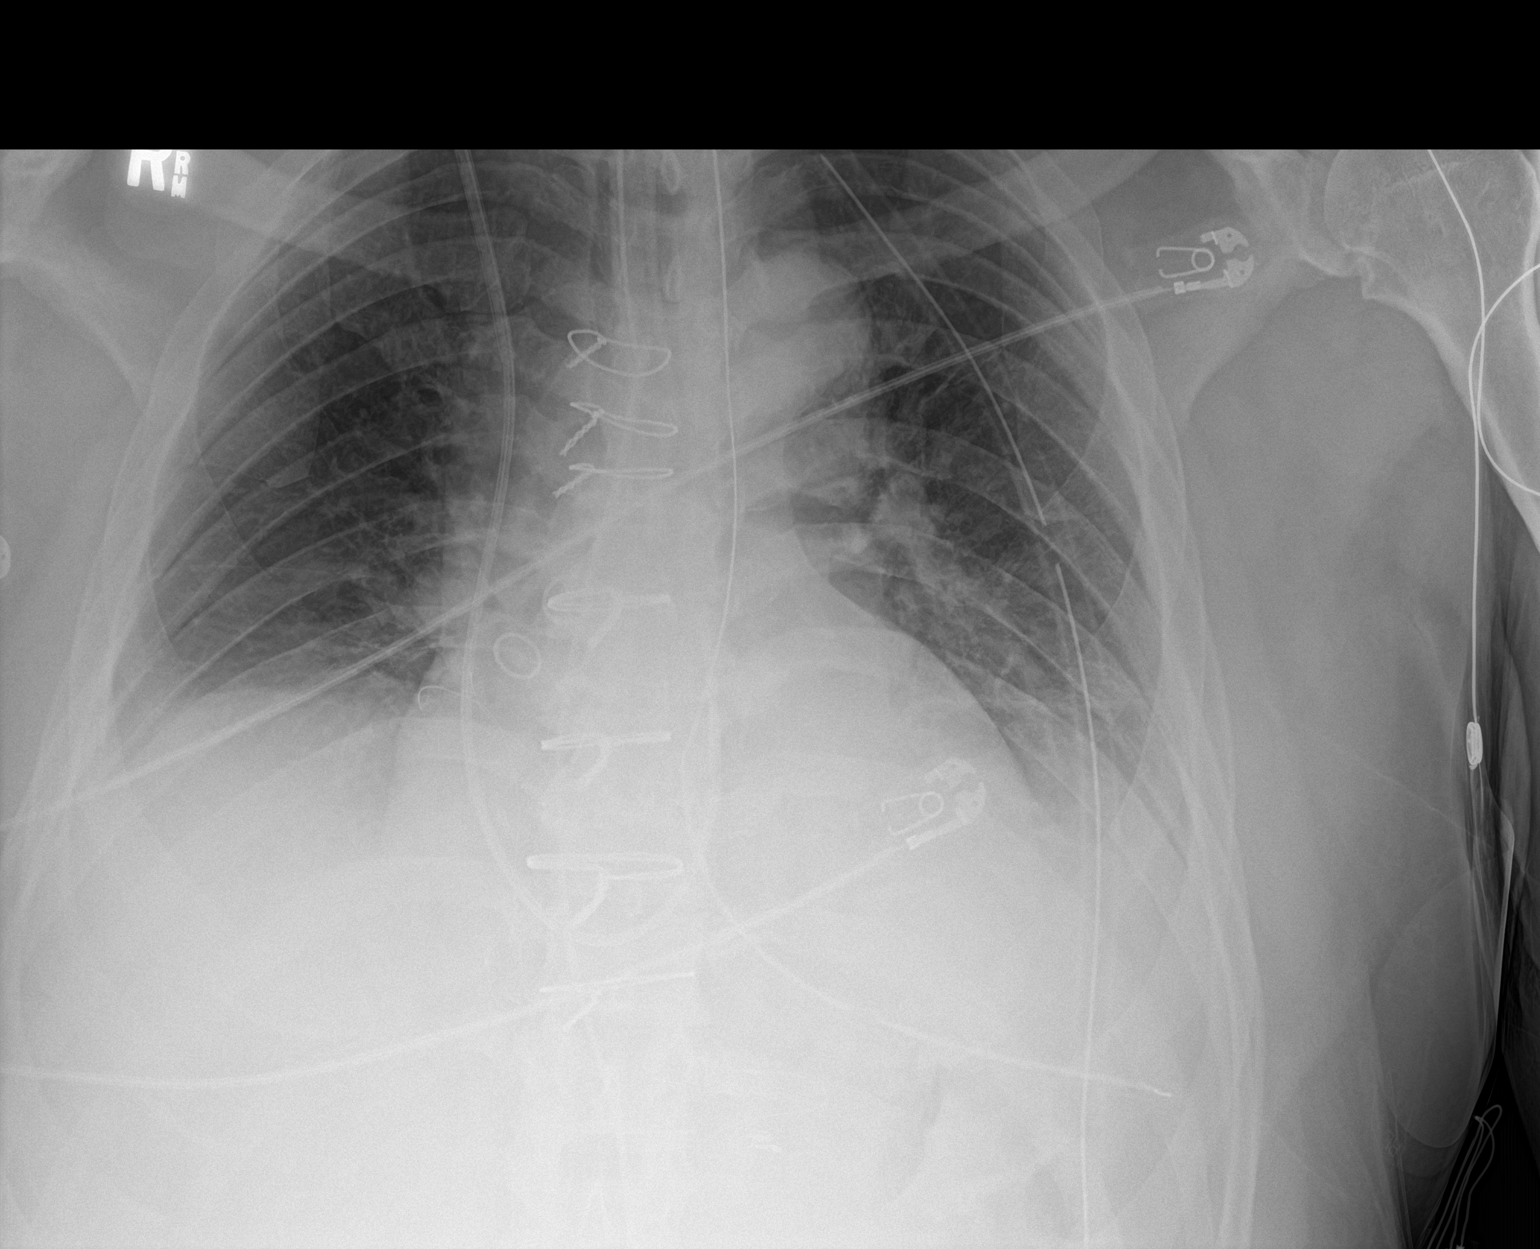

[1 of 1 positions shown; findings below may reference images not displayed]

FINDINGS: Endotracheal tube in good position. Swan-Ganz catheter in the
pulmonary outflow tract. NG tube in the stomach. Left chest tube in
place.

Negative for pneumothorax. Hypoventilation with bibasilar
atelectasis and small effusions. Negative for edema.
IMPRESSION: Support lines in good position.  No pneumothorax

Bibasilar atelectasis and small pleural effusions left greater than
right.

## 2018-09-09 DIAGNOSIS — R002 Palpitations: Secondary | ICD-10-CM | POA: Insufficient documentation

## 2018-09-09 HISTORY — DX: Palpitations: R00.2

## 2018-09-09 NOTE — Progress Notes (Signed)
Cardiology Office Note:    Date:  09/10/2018   ID:  Scott Rosales, DOB [birth year], MRN [id number]  PCP:  Cain Sieve, MD  Cardiologist:  Norman Herrlich, MD    Referring MD: No ref. provider found    ASSESSMENT:    1. Coronary artery disease of native artery of native heart with stable angina pectoris (HCC)   2. Palpitation   3. Hyperlipidemia, unspecified hyperlipidemia type    PLAN:    In order of problems listed above:  1. Stable CAD at this time I do not think it requires an ischemia evaluation will attempt to access his last outpatient EKG with Dr. Donnie Aho for comparison. 2. Further evaluation check magnesium potassium renal function 14-day ZIO monitor reassess in the office in 1 month 3. Improved continue PCSK9 asked him to consider intermittent low-dose of a low intensity statin but he tells me he is absolutely intolerant   Next appointment: 4 weeks   Medication Adjustments/Labs and Tests Ordered: Current medicines are reviewed at length with the patient today.  Concerns regarding medicines are outlined above.  No orders of the defined types were placed in this encounter.  No orders of the defined types were placed in this encounter.   Chief Complaint  Patient presents with  . Palpitations    History of Present Illness:    Scott Rosales is a 57 y.o. male with a hx of CAD non STEMI and CABG 10/13/17 last seen by Dr. Donnie Aho 07/08/2018.  Ejection fraction is documented at 60% most recent labs 07/09/2018 cholesterol 160 LDL 26 HDL 64.  At that time there was concern about obesity she was given counseling and advised to follow-up in the office in 6 months.. Compliance with diet, lifestyle and medications: Yes  He has been aware of his heart beating it does not feel abnormal he feels forceful it waxes and wanes time to time and is occurring more frequently now several times a day is not exertional does not interfere with activities he is constantly  cardiac apprehensive.  He takes no over-the-counter proarrhythmic drugs but he said no recent lab work and he had been on a diuretic previously and is at risk for low magnesium and low potassium.  To screen for arrhythmia utilize a 14-day event monitor at this time I do not think he needs an ischemia evaluation his EKG in my office today shows sinus rhythm and precordial T wave inversion.  He has had no angina dyspnea syncope or TIA. EKG in epic from December 2018 had a pattern of acute anterior ST elevation MI. Past Medical History:  Diagnosis Date  . CAD (coronary artery disease), native coronary artery 10/15/2017   Non STEMI 12/18 Cardiac cath 10/12/17  PDA100%, circ bifurcation mid and OM 95%, 70% LAD diffuse with FFR .79%  CABG 12/3 Dr. Tyrone Sage with LIMA to LAD, SVG to OM1-2, SVG to PDA  . GERD (gastroesophageal reflux disease) 06/14/2013  . Hypercholesteremia 05/19/2007  . Hyperlipidemia 10/16/2017  . Pre-hypertension 06/14/2013  . 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 CORONARY ARTERY BYPASS GRAFT N/A 10/13/2017   Procedure: CORONARY ARTERY BYPASS GRAFTING (CABG) x 4, LIMA to LAD, SVG to OM1 and distal circ, SVG to PDA, using left internal mammary artery and right greater saphenous vein harvested endoscopically;  Surgeon: Delight Ovens, MD;  Location: [facility] OR;  Service: Open Heart Surgery;  Laterality: N/A;  . INTRAVASCULAR PRESSURE WIRE/FFR STUDY N/A  10/12/2017   Procedure: INTRAVASCULAR PRESSURE WIRE/FFR STUDY;  Surgeon: Marykay Lex, MD;  Location: [facility] INVASIVE CV LAB;  Service: Cardiovascular;  Laterality: N/A;  . LEFT HEART CATH AND CORONARY ANGIOGRAPHY N/A 10/12/2017   Procedure: LEFT HEART CATH AND CORONARY ANGIOGRAPHY;  Surgeon: Marykay Lex, MD;  Location: [facility] INVASIVE CV LAB;  Service: Cardiovascular;  Laterality: N/A;  . TEE WITHOUT CARDIOVERSION  10/13/2017   Procedure: TRANSESOPHAGEAL ECHOCARDIOGRAM (TEE);  Surgeon: Delight Ovens,  MD;  Location[facility] OR;  Service: Open Heart Surgery;;  . ULTRASOUND GUIDANCE FOR VASCULAR ACCESS  10/12/2017   Procedure: Ultrasound Guidance For Vascular Access;  Surgeon: Marykay Lex, MD;  Location: [facility] INVASIVE CV LAB;  Service: Cardiovascular;;    Current Medications: Current Meds  Medication Sig  . aspirin EC 81 MG tablet Take 81 mg by mouth daily.  . clopidogrel (PLAVIX) 75 MG tablet Take 1 tablet (75 mg total) by mouth daily.  . metoprolol tartrate (LOPRESSOR) 25 MG tablet Take 1 tablet (25 mg total) by mouth 2 (two) times daily.  Marland Kitchen omeprazole (PRILOSEC) 20 MG capsule Take 1 capsule by mouth daily as needed.  Marland Kitchen PRALUENT 75 MG/ML SOPN Inject 75 mg into the skin every 14 (fourteen) days.     Allergies:   Patient has no known allergies.   Social History   Socioeconomic History  . Marital status: Married    Spouse name: Not on file  . Number of children: Not on file  . Years of education: Not on file  . Highest education level: Not on file  Occupational History  . Not on file  Social Needs  . Financial resource strain: Not on file  . Food insecurity:    Worry: Not on file    Inability: Not on file  . Transportation needs:    Medical: Not on file    Non-medical: Not on file  Tobacco Use  . Smoking status: Never Smoker  . Smokeless tobacco: Never Used  Substance and Sexual Activity  . Alcohol use: Never    Frequency: Never  . Drug use: Never  . Sexual activity: Not on file  Lifestyle  . Physical activity:    Days per week: Not on file    Minutes per session: Not on file  . Stress: Not on file  Relationships  . Social connections:    Talks on phone: Not on file    Gets together: Not on file    Attends religious service: Not on file    Active member of club or organization: Not on file    Attends meetings of clubs or organizations: Not on file    Relationship status: Not on file  Other Topics Concern  . Not on file  Social History Narrative  . Not on file       Family History: The patient's family history includes Diabetes in his brother and brother; Diabetes Mellitus II in his mother; Heart attack in his father; Hypertension in his mother; Sudden death in his brother. ROS:   Please see the history of present illness.    All other systems reviewed and are negative.  EKGs/Labs/Other Studies Reviewed:    The following studies were reviewed today:  EKG:  EKG ordered today.  The ekg ordered today demonstrates sinus rhythm precordial T wave inversion  Recent Labs: 10/13/2017: ALT 20; TSH 3.720 10/14/2017: Magnesium 2.4 10/16/2017: BUN 16; Creatinine, Ser 1.26; Hemoglobin 11.4; Platelets 154; Potassium 3.9; Sodium 136  Recent Lipid Panel  Component Value Date/Time   CHOL 264 (H) 10/12/2017 0139   TRIG 126 10/12/2017 0139   HDL 60 10/12/2017 0139   CHOLHDL 4.4 10/12/2017 0139   VLDL 25 10/12/2017 0139   LDLCALC 179 (H) 10/12/2017 0139    Physical Exam:    VS:  BP 132/82 (BP Location: Right Arm, Patient Position: Sitting, Cuff Size: Large)   Pulse (!) 57   Ht 5\' 10"  (1.778 m)   Wt 261 lb (118.4 kg)   SpO2 98%   BMI 37.45 kg/m     Wt Readings from Last 3 Encounters:  09/10/18 261 lb (118.4 kg)  11/24/17 260 lb 6.4 oz (118.1 kg)  10/17/17 265 lb 9.6 oz (120.5 kg)     GEN:  Well nourished, well developed in no acute distress HEENT: Normal NECK: No JVD; No carotid bruits LYMPHATICS: No lymphadenopathy CARDIAC: RRR, no murmurs, rubs, gallops RESPIRATORY:  Clear to auscultation without rales, wheezing or rhonchi  ABDOMEN: Soft, non-tender, non-distended MUSCULOSKELETAL:  No edema; No deformity  SKIN: Warm and dry NEUROLOGIC:  Alert and oriented x 3 PSYCHIATRIC:  Normal affect    Signed, Norman Herrlich, MD  09/10/2018 4:53 PM     [facility]

## 2018-09-10 ENCOUNTER — Encounter: Payer: Self-pay | Admitting: Cardiology

## 2018-09-10 ENCOUNTER — Ambulatory Visit (INDEPENDENT_AMBULATORY_CARE_PROVIDER_SITE_OTHER): Payer: 59 | Admitting: Cardiology

## 2018-09-10 VITALS — BP 132/82 | HR 57 | Ht 70.0 in | Wt 261.0 lb

## 2018-09-10 DIAGNOSIS — R002 Palpitations: Secondary | ICD-10-CM | POA: Diagnosis not present

## 2018-09-10 DIAGNOSIS — E785 Hyperlipidemia, unspecified: Secondary | ICD-10-CM | POA: Diagnosis not present

## 2018-09-10 DIAGNOSIS — I25118 Atherosclerotic heart disease of native coronary artery with other forms of angina pectoris: Secondary | ICD-10-CM | POA: Diagnosis not present

## 2018-09-10 NOTE — Patient Instructions (Addendum)
Medication Instructions:  Your physician recommends that you continue on your current medications as directed. Please refer to the Current Medication list given to you today.  If you need a refill on your cardiac medications before your next appointment, please call your pharmacy.   Lab work: You will have lab work today: BMP and Magnesium  If you have labs (blood work) drawn today and your tests are completely normal, you will receive your results only by: Marland Kitchen MyChart Message (if you have MyChart) OR . A paper copy in the mail If you have any lab test that is abnormal or we need to change your treatment, we will call you to review the results.  Testing/Procedures: You had an EKG today   Your physician has recommended that you wear a ZIO. ZIO monitors are medical devices that record the heart's electrical activity. Doctors most often use these monitors to diagnose arrhythmias. Arrhythmias are problems with the speed or rhythm of the heartbeat. The monitor is a small, portable device. You can wear one while you do your normal daily activities. This is usually used to diagnose what is causing palpitations/syncope (passing out).  Follow-Up: At [facility], you and your health needs are our priority.  As part of our continuing mission to provide you with exceptional heart care, we have created designated Provider Care Teams.  These Care Teams include your primary Cardiologist (physician) and Advanced Practice Providers (APPs -  Physician Assistants and Nurse Practitioners) who all work together to provide you with the care you need, when you need it. . You will need a follow up appointment in 4 weeks.    Any Other Special Instructions Will Be Listed Below (If Applicable).      1. Avoid all over-the-counter antihistamines except Claritin/Loratadine and Zyrtec/Cetrizine. 2. Avoid all combination including cold sinus allergies flu decongestant and sleep medications 3. You can use Robitussin  DM Mucinex and Mucinex DM for cough. 4. can use Tylenol aspirin ibuprofen and naproxen but no combinations such as sleep or sinus.

## 2018-09-11 LAB — MAGNESIUM: MAGNESIUM: 2.2 mg/dL (ref 1.6–2.3)

## 2018-09-11 LAB — BASIC METABOLIC PANEL
BUN / CREAT RATIO: 16 (ref 9–20)
BUN: 20 mg/dL (ref 6–24)
CHLORIDE: 104 mmol/L (ref 96–106)
CO2: 25 mmol/L (ref 20–29)
CREATININE: 1.23 mg/dL (ref 0.76–1.27)
Calcium: 9.6 mg/dL (ref 8.7–10.2)
GFR calc Af Amer: 75 mL/min/{1.73_m2} (ref >59)
GFR calc non Af Amer: 65 mL/min/{1.73_m2} (ref >59)
GLUCOSE: 82 mg/dL (ref 65–99)
Potassium: 4.8 mmol/L (ref 3.5–5.2)
Sodium: 144 mmol/L (ref 134–144)

## 2018-09-14 ENCOUNTER — Ambulatory Visit (INDEPENDENT_AMBULATORY_CARE_PROVIDER_SITE_OTHER): Payer: 59

## 2018-09-14 DIAGNOSIS — R002 Palpitations: Secondary | ICD-10-CM | POA: Diagnosis not present

## 2018-09-14 DIAGNOSIS — I25118 Atherosclerotic heart disease of native coronary artery with other forms of angina pectoris: Secondary | ICD-10-CM | POA: Diagnosis not present

## 2018-09-14 DIAGNOSIS — E785 Hyperlipidemia, unspecified: Secondary | ICD-10-CM

## 2018-10-06 ENCOUNTER — Telehealth: Payer: Self-pay

## 2018-10-06 DIAGNOSIS — R002 Palpitations: Secondary | ICD-10-CM

## 2018-10-06 DIAGNOSIS — I25118 Atherosclerotic heart disease of native coronary artery with other forms of angina pectoris: Secondary | ICD-10-CM

## 2018-10-06 NOTE — Telephone Encounter (Signed)
Patient aware of results of long term monitor with PVC's per Dr Dulce SellarMunley.  Patient advised that Dr Dulce SellarMunley would like him to have a myoview.    Patient advised that he will be called once test has been scheduled with date and time. Instruction sheet will be mailed to the patient.  Patient agrees to plan and verbalized understanding.

## 2018-10-19 ENCOUNTER — Telehealth (HOSPITAL_COMMUNITY): Payer: Self-pay | Admitting: *Deleted

## 2018-10-19 NOTE — Telephone Encounter (Signed)
Patient given detailed instructions per Myocardial Perfusion Study Information Sheet for the test on 10/21/18 at 1000. Patient notified to arrive 15 minutes early and that it is imperative to arrive on time for appointment to keep from having the test rescheduled.  If you need to cancel or reschedule your appointment, please call the office within 24 hours of your appointment. . Patient verbalized understanding.Khristian Phillippi, Adelene IdlerCynthia W

## 2018-10-21 ENCOUNTER — Ambulatory Visit (HOSPITAL_COMMUNITY): Payer: 59 | Attending: Cardiology

## 2018-10-21 ENCOUNTER — Encounter (INDEPENDENT_AMBULATORY_CARE_PROVIDER_SITE_OTHER): Payer: Self-pay

## 2018-10-21 VITALS — Ht 70.0 in | Wt 261.0 lb

## 2018-10-21 DIAGNOSIS — I25118 Atherosclerotic heart disease of native coronary artery with other forms of angina pectoris: Secondary | ICD-10-CM | POA: Diagnosis not present

## 2018-10-21 DIAGNOSIS — R002 Palpitations: Secondary | ICD-10-CM

## 2018-10-21 DIAGNOSIS — E7800 Pure hypercholesterolemia, unspecified: Secondary | ICD-10-CM | POA: Diagnosis present

## 2018-10-21 LAB — MYOCARDIAL PERFUSION IMAGING
CHL CUP NUCLEAR SDS: 1
CSEPPHR: 76 {beats}/min
LV dias vol: 114 mL (ref 62–150)
LV sys vol: 37 mL
NUC STRESS TID: 1.06
Rest HR: 57 {beats}/min
SRS: 0
SSS: 1

## 2018-10-21 MED ORDER — TECHNETIUM TC 99M TETROFOSMIN IV KIT
10.3000 | PACK | Freq: Once | INTRAVENOUS | Status: AC | PRN
  Administered 2018-10-21: 10.3 via INTRAVENOUS
  Filled 2018-10-21: qty 11

## 2018-10-21 MED ORDER — REGADENOSON 0.4 MG/5ML IV SOLN
0.4000 mg | Freq: Once | INTRAVENOUS | Status: AC
  Administered 2018-10-21: 0.4 mg via INTRAVENOUS

## 2018-10-21 MED ORDER — TECHNETIUM TC 99M TETROFOSMIN IV KIT
32.4000 | PACK | Freq: Once | INTRAVENOUS | Status: AC | PRN
  Administered 2018-10-21: 32.4 via INTRAVENOUS
  Filled 2018-10-21: qty 33

## 2018-10-26 NOTE — Progress Notes (Signed)
Cardiology Office Note:    Date:  10/27/2018   ID:  Scott Rosales, DOB September 18, 1961, MRN [id number]  PCP:  Cain SieveKlipstein, Christopher, MD  Cardiologist:  Norman HerrlichBrian Tivon Lemoine, MD    Referring MD: Cain SieveKlipstein, Christopher,*    ASSESSMENT:   2 1. Coronary artery disease of native artery of native heart with stable angina pectoris (HCC)   2. PVC's (premature ventricular contractions)   3. Hyperlipidemia, unspecified hyperlipidemia type    PLAN:    In order of problems listed above:  1. Stable CAD continue medical therapy recent myocardial perfusion study is low risk 2. Occasional symptomatic PVCs is now no proarrhythmic drugs electrolytes are normal switch to a more effective selective antiarrhythmic beta-blocker 3. Continue Praluent recheck lipids may require second agent   Next appointment: 3 months   Medication Adjustments/Labs and Tests Ordered: Current medicines are reviewed at length with the patient today.  Concerns regarding medicines are outlined above.  No orders of the defined types were placed in this encounter.  No orders of the defined types were placed in this encounter.   No chief complaint on file.   History of Present Illness:    Scott Rosales is a 57 y.o. male with a hx of CAD non STEMI and CABG 10/13/17 last seen by Dr. Donnie Ahoilley 07/08/2018.  Ejection fraction is documented at 60%  last seen 09/10/18. A 14 day Zio monitor shows symptomatic PVC's .  His myocardial perfusion study showed normal function and no ischemia.  He continues to note palpitation particularly in the evening and he finds that after he takes his metoprolol.  I reviewed his event monitor with him he has symptomatic PVCs and to alleviate symptoms we will switch to a pattern more selective antiarrhythmic beta-blocker ACE beta-blocker.  He is on  injectable PCSK9 recheck his lipid profile and if remains above target add Zetia.  Overall is done well no angina dyspnea syncope or TIA  Study Highlights    A ZIO monitor was performed for 12 days 10 hours in order to assess palpitation.  The rhythm is sinus with minimum average and maximum heart rates of 44, 64 and 136 bpm. There were no pauses of 3 seconds or greater, no episodes of sinus node or AV block.  Supraventricular ectopy is rare with isolated APCs and couplets and 2 runs of atrial premature beats the longest 12 beats in duration a rate of 101 bpm, the fastest 8 beats at a rate of 119 bpm.  There are no episodes of atrial fibrillation or flutter.  Ventricular ectopy is rare with isolated PVCs and couplets.  There is one ventricular triplet and 2 brief runs of PVCs the longest 7 beats at a rate of 83 bpm the fastest 5 complexes rate of 144 bpm.  There were 15 triggered and symptomatic events present predominantly associated with frequent PVCs  Conclusion rare supraventricular ventricular ectopy, however PVCs are symptomatic.    Compliance with diet, lifestyle and medications: Yes Past Medical History:  Diagnosis Date  . CAD (coronary artery disease), native coronary artery 10/15/2017   Non STEMI 12/18 Cardiac cath 10/12/17  PDA100%, circ bifurcation mid and OM 95%, 70% LAD diffuse with FFR .79%  CABG 12/3 Dr. Tyrone SageGerhardt with LIMA to LAD, SVG to OM1-2, SVG to PDA  . GERD (gastroesophageal reflux disease) 06/14/2013  . Hypercholesteremia 05/19/2007  . Hyperlipidemia 10/16/2017  . Pre-hypertension 06/14/2013  . 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  CARDIAC CATHETERIZATION    . CORONARY ARTERY BYPASS GRAFT N/A 10/13/2017   Procedure: CORONARY ARTERY BYPASS GRAFTING (CABG) x 4, LIMA to LAD, SVG to OM1 and distal circ, SVG to PDA, using left internal mammary artery and right greater saphenous vein harvested endoscopically;  Surgeon: Delight Ovens, MD;  Location: [facility];  Service: Open Heart Surgery;  Laterality: N/A;  . INTRAVASCULAR PRESSURE WIRE/FFR STUDY N/A 10/12/2017   Procedure: INTRAVASCULAR  PRESSURE WIRE/FFR STUDY;  Surgeon: Marykay Lex, MD;  Location: [facility] INVASIVE CV LAB;  Service: Cardiovascular;  Laterality: N/A;  . LEFT HEART CATH AND CORONARY ANGIOGRAPHY N/A 10/12/2017   Procedure: LEFT HEART CATH AND CORONARY ANGIOGRAPHY;  Surgeon: Marykay Lex, MD;  Location: [facility] INVASIVE CV LAB;  Service: Cardiovascular;  Laterality: N/A;  . TEE WITHOUT CARDIOVERSION  10/13/2017   Procedure: TRANSESOPHAGEAL ECHOCARDIOGRAM (TEE);  Surgeon: Delight Ovens, MD;  Location: [facility] OR;  Service: Open Heart Surgery;;  . ULTRASOUND GUIDANCE FOR VASCULAR ACCESS  10/12/2017   Procedure: Ultrasound Guidance For Vascular Access;  Surgeon: Marykay Lex, MD;  Location: [facility] INVASIVE CV LAB;  Service: Cardiovascular;;    Current Medications: Current Meds  Medication Sig  . aspirin EC 81 MG tablet Take 81 mg by mouth daily.  . clopidogrel (PLAVIX) 75 MG tablet Take 1 tablet (75 mg total) by mouth daily.  . metoprolol tartrate (LOPRESSOR) 25 MG tablet Take 1 tablet (25 mg total) by mouth 2 (two) times daily.  Marland Kitchen PRALUENT 75 MG/ML SOPN Inject 75 mg into the skin every 14 (fourteen) days.     Allergies:   Patient has no known allergies.   Social History   Socioeconomic History  . Marital status: Married    Spouse name: Not on file  . Number of children: Not on file  . Years of education: Not on file  . Highest education level: Not on file  Occupational History  . Not on file  Social Needs  . Financial resource strain: Not on file  . Food insecurity:    Worry: Not on file    Inability: Not on file  . Transportation needs:    Medical: Not on file    Non-medical: Not on file  Tobacco Use  . Smoking status: Never Smoker  . Smokeless tobacco: Never Used  Substance and Sexual Activity  . Alcohol use: Never    Frequency: Never  . Drug use: Never  . Sexual activity: Not on file  Lifestyle  . Physical activity:    Days per week: Not on file    Minutes per session: Not on file  .  Stress: Not on file  Relationships  . Social connections:    Talks on phone: Not on file    Gets together: Not on file    Attends religious service: Not on file    Active member of club or organization: Not on file    Attends meetings of clubs or organizations: Not on file    Relationship status: Not on file  Other Topics Concern  . Not on file  Social History Narrative  . Not on file     Family History: The patient's family history includes Diabetes in his brother and brother; Diabetes Mellitus II in his mother; Heart attack in his father; Hypertension in his mother; Sudden death in his brother. ROS:   Please see the history of present illness.    All other systems reviewed and are negative.  EKGs/Labs/Other Studies Reviewed:  The following studies were reviewed today:  Recent Labs: 09/10/2018: BUN 20; Creatinine, Ser 1.23; Magnesium 2.2; Potassium 4.8; Sodium 144  Recent Lipid Panel    Component Value Date/Time   CHOL 264 (H) 10/12/2017 0139   TRIG 126 10/12/2017 0139   HDL 60 10/12/2017 0139   CHOLHDL 4.4 10/12/2017 0139   VLDL 25 10/12/2017 0139   LDLCALC 179 (H) 10/12/2017 0139    Physical Exam:    VS:  BP 126/80 (BP Location: Left Arm, Patient Position: Sitting, Cuff Size: Large)   Pulse 63   Ht 5\' 10"  (1.778 m)   Wt 262 lb 2 oz (118.9 kg)   SpO2 95%   BMI 37.61 kg/m     Wt Readings from Last 3 Encounters:  10/27/18 262 lb 2 oz (118.9 kg)  10/21/18 261 lb (118.4 kg)  09/10/18 261 lb (118.4 kg)     GEN:  Well nourished, well developed in no acute distress HEENT: Normal NECK: No JVD; No carotid bruits LYMPHATICS: No lymphadenopathy CARDIAC: RRR, no murmurs, rubs, gallops RESPIRATORY:  Clear to auscultation without rales, wheezing or rhonchi  ABDOMEN: Soft, non-tender, non-distended MUSCULOSKELETAL:  No edema; No deformity  SKIN: Warm and dry NEUROLOGIC:  Alert and oriented x 3 PSYCHIATRIC:  Normal affect    Signed, Norman Herrlich, MD    10/27/2018 4:39 PM    [facility]

## 2018-10-27 ENCOUNTER — Ambulatory Visit (INDEPENDENT_AMBULATORY_CARE_PROVIDER_SITE_OTHER): Payer: 59 | Admitting: Cardiology

## 2018-10-27 ENCOUNTER — Encounter: Payer: Self-pay | Admitting: Cardiology

## 2018-10-27 VITALS — BP 126/80 | HR 63 | Ht 70.0 in | Wt 262.1 lb

## 2018-10-27 DIAGNOSIS — I25118 Atherosclerotic heart disease of native coronary artery with other forms of angina pectoris: Secondary | ICD-10-CM | POA: Diagnosis not present

## 2018-10-27 DIAGNOSIS — I493 Ventricular premature depolarization: Secondary | ICD-10-CM

## 2018-10-27 DIAGNOSIS — E785 Hyperlipidemia, unspecified: Secondary | ICD-10-CM | POA: Diagnosis not present

## 2018-10-27 HISTORY — DX: Ventricular premature depolarization: I49.3

## 2018-10-27 MED ORDER — ACEBUTOLOL HCL 400 MG PO CAPS: 400.0000 mg | ORAL_CAPSULE | Freq: Every day | ORAL | 3 refills | Status: DC

## 2018-10-27 NOTE — Patient Instructions (Signed)
Medication Instructions:  Your physician has recommended you make the following change in your medication:   STOP: metoprolol START: acebutolol 400mg  take one capsule daily  If you need a refill on your cardiac medications before your next appointment, please call your pharmacy.   Lab work: Lipid panel today.  If you have labs (blood work) drawn today and your tests are completely normal, you will receive your results only by: Marland Kitchen. MyChart Message (if you have MyChart) OR . A paper copy in the mail If you have any lab test that is abnormal or we need to change your treatment, we will call you to review the results.  Testing/Procedures: NONE  Follow-Up: At [facility], you and your health needs are our priority.  As part of our continuing mission to provide you with exceptional heart care, we have created designated Provider Care Teams.  These Care Teams include your primary Cardiologist (physician) and Advanced Practice Providers (APPs -  Physician Assistants and Nurse Practitioners) who all work together to provide you with the care you need, when you need it. You will need a follow up appointment in 3 months.  Please call our office 2 months in advance to schedule this appointment.

## 2018-10-28 ENCOUNTER — Telehealth: Payer: Self-pay

## 2018-10-28 LAB — LIPID PANEL
CHOL/HDL RATIO: 3.3 ratio (ref 0.0–5.0)
Cholesterol, Total: 179 mg/dL (ref 100–199)
HDL: 55 mg/dL (ref >39)
LDL CALC: 75 mg/dL (ref 0–99)
Triglycerides: 244 mg/dL — ABNORMAL HIGH (ref 0–149)
VLDL CHOLESTEROL CAL: 49 mg/dL — AB (ref 5–40)

## 2018-10-28 NOTE — Telephone Encounter (Signed)
Attempted to reach patient regarding lab results, but voicemail box is full.  Will continue efforts.

## 2018-10-28 NOTE — Telephone Encounter (Signed)
-----   Message from Baldo DaubBrian J Munley, MD sent at 10/28/2018  7:47 AM EST ----- Normal or stable result   Good result, non fasting. At target, no changes

## 2018-10-28 NOTE — Telephone Encounter (Signed)
Patient aware of results.  Patient advised to continue current medications as directed per Dr Dulce SellarMunley.  Patient agrees to plan and verbalized understanding.

## 2018-11-06 ENCOUNTER — Other Ambulatory Visit: Payer: Self-pay | Admitting: *Deleted

## 2018-11-06 MED ORDER — ACEBUTOLOL HCL 400 MG PO CAPS: 400.0000 mg | ORAL_CAPSULE | Freq: Every day | ORAL | 0 refills | Status: DC

## 2018-11-06 NOTE — Telephone Encounter (Signed)
Refill for acebutolol sent to [facility] as requested.

## 2019-01-25 ENCOUNTER — Other Ambulatory Visit: Payer: Self-pay

## 2019-01-25 ENCOUNTER — Other Ambulatory Visit: Payer: Self-pay | Admitting: Cardiology

## 2019-01-25 MED ORDER — ALIROCUMAB 75 MG/ML ~~LOC~~ SOAJ: 75.0000 mg | SUBCUTANEOUS | 2 refills | Status: DC

## 2019-01-25 NOTE — Telephone Encounter (Signed)
Refills sent in

## 2019-01-25 NOTE — Telephone Encounter (Signed)
°  Need prior authorization, set to expire   1. Which medications need to be refilled? (please list name of each medication and dose if known) Praluent Inj 75mg   2. Which pharmacy/location (including street and city if local pharmacy) is medication to be sent to?[facility]  3. Do they need a 30 day or 90 day supply?

## 2019-02-04 ENCOUNTER — Telehealth: Payer: Self-pay | Admitting: Cardiology

## 2019-02-05 ENCOUNTER — Ambulatory Visit: Payer: 59 | Admitting: Cardiology

## 2019-02-12 ENCOUNTER — Telehealth: Payer: Self-pay

## 2019-02-12 NOTE — Telephone Encounter (Signed)
Patient aware that he qualifies for Copay card.  A copay card was mailed to the patient out of the [place] office.  Patient advised to contact our office if he has not received the card by the end of next week.  Patient agrees to plan and verbalized understanding.

## 2019-02-12 NOTE — Telephone Encounter (Signed)
-----   Message from Levin Bacon, [facility] sent at 02/12/2019  3:06 PM EDT ----- Regarding: RE: Praluent Patient Assistance He should be able to use the copay card as he has commercial insurance. This should reduce the copay down to about $5 per month. Let me know if you need anything else for him.   Thanks, Nicholaus Bloom  ----- Message ----- From: Lamona Curl, CMA Sent: 02/12/2019   2:20 PM EDT To: Levin Bacon, RPH Subject: Praluent Patient Assistance                    Tresa Endo,  We have attempted to get patient assistance for this patient with Praluent.  We did not have any success.  Could you please write a letter to see if we could get this approved for the patient.  I am having someone in the [facility] office fax you the denial letter.  Please let me know if there is anything else I can help with.  Thank you for your help, NIcole

## 2019-02-23 NOTE — Telephone Encounter (Signed)
This encounter was created in error - please disregard.

## 2019-02-25 NOTE — Telephone Encounter (Signed)
error 

## 2019-02-27 ENCOUNTER — Other Ambulatory Visit: Payer: Self-pay | Admitting: Cardiology

## 2019-03-01 NOTE — Telephone Encounter (Signed)
Acebutolol sent to [facility]

## 2019-03-03 ENCOUNTER — Telehealth: Payer: Self-pay | Admitting: Cardiology

## 2019-03-03 NOTE — Telephone Encounter (Signed)
Scott Rosales wanted to speak to you regarding his PA paperwork. He asked that you call him at your convenience.

## 2019-03-03 NOTE — Telephone Encounter (Signed)
I know you were speaking with this patient today. Please document conversation here. Thanks!

## 2019-03-04 NOTE — Telephone Encounter (Signed)
Late entry for patient inquiry from 03/04/2019.     Patient calling for assistance with insurance approval and financial assistance for praulent prescription. Patient has previously been denied coverage by insurance, was able to obtain partial coverage subsequently and also assisted to obtain a co-pay card. Patient had medication filled and noted that he'd been charged $437. He thought is should be less than that, was confused about the amount, doesn't know if his co-pay card was used  and states that he can't afford the prescription.     Notes reviewed, informed that with co-pay card, his co-pay should only be $5. Directed patient to follow up with his insurance and pharmacy to determine if co-pay card was applied to purchase. Patient verbalized understanding and states he will follow up as described above.

## 2019-03-04 NOTE — Telephone Encounter (Signed)
Pharmacy called stating that [employer] is requiring a prior autherization. Last one has expired.

## 2019-03-08 ENCOUNTER — Telehealth: Payer: Self-pay

## 2019-03-08 NOTE — Telephone Encounter (Signed)
Left voice message (OK per DPR) that Praulent has been approved by insurance and to reach out to pharmacy for refills.

## 2019-04-07 ENCOUNTER — Telehealth: Payer: 59 | Admitting: Cardiology

## 2019-04-26 NOTE — Progress Notes (Signed)
Virtual Visit via Video Note   This visit type was conducted due to national recommendations for restrictions regarding the COVID-19 Pandemic (e.g. social distancing) in an effort to limit this patient's exposure and mitigate transmission in our community.  Due to his co-morbid illnesses, this patient is at least at moderate risk for complications without adequate follow up.  This format is felt to be most appropriate for this patient at this time.  All issues noted in this document were discussed and addressed.  A limited physical exam was performed with this format.  Please refer to the patient's chart for his consent to telehealth for [facility].   Date:  04/26/2019   ID:  Scott Rosales, DOB [birth year], MRN [id number]  Patient Location: Home Provider Location: Office  PCP:  Cain SieveKlipstein, Christopher, MD  Cardiologist:  Norman HerrlichBrian Hanan Mcwilliams, MD  Electrophysiologist:  None   Evaluation Performed:  Follow-Up Visit  Chief Complaint:  58 yo male presents for 3 month follow up of cardiac conditions including CAD s/p CABG, PVC, HLD.   History of Present Illness:    Scott Rosales is a 58 y.o. male with PMH CAD s/p NSTEMI and CABG 10/13/17, symptomatic PVCs, HLD last seen 10/27/18. He had a gated myoview 10/21/18 with no evidence for ischemia and EF 68%.   03/30/19 tested positive for covid19  The patient does not have symptoms concerning for COVID-19 infection (fever, chills, cough, or new shortness of breath).   [name] had a COVID-19 exposure and had very brief self-limited illness with headache loss of smell and taste but never became systemically ill no fever and has fully recovered.  He had no cardiac complications and has had no edema shortness of breath chest pain palpitation or syncope is markedly improved by switching to a different beta-blocker.  He does have a persistent numbness in the leg at the site of the inferior stripping of the vein graft related to his surgery.  As his  course is favorable I will plan to see him in the office in December I will check lab work in the next few weeks and at that time his next office visit he will need an EKG. Past Medical History:  Diagnosis Date  . CAD (coronary artery disease), native coronary artery 10/15/2017   Non STEMI 12/18 Cardiac cath 10/12/17  PDA100%, circ bifurcation mid and OM 95%, 70% LAD diffuse with FFR .79%  CABG 12/3 Dr. Tyrone SageGerhardt with LIMA to LAD, SVG to OM1-2, SVG to PDA  . GERD (gastroesophageal reflux disease) 06/14/2013  . Hypercholesteremia 05/19/2007  . Hyperlipidemia 10/16/2017  . Pre-hypertension 06/14/2013  . 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 CARDIAC CATHETERIZATION    . CORONARY ARTERY BYPASS GRAFT N/A 10/13/2017   Procedure: CORONARY ARTERY BYPASS GRAFTING (CABG) x 4, LIMA to LAD, SVG to OM1 and distal circ, SVG to PDA, using left internal mammary artery and right greater saphenous vein harvested endoscopically;  Surgeon: Delight OvensGerhardt, Edward B, MD;  [facility];  Service: Open Heart Surgery;  Laterality: N/A;  . INTRAVASCULAR PRESSURE WIRE/FFR STUDY N/A 10/12/2017   Procedure: INTRAVASCULAR PRESSURE WIRE/FFR STUDY;  Surgeon: Marykay LexHarding, David W, MD;  [facility];  Service: Cardiovascular;  Laterality: N/A;  . LEFT HEART CATH AND CORONARY ANGIOGRAPHY N/A 10/12/2017   Procedure: LEFT HEART CATH AND CORONARY ANGIOGRAPHY;  Surgeon: Marykay LexHarding, David W, MD;  [facility];  Service: Cardiovascular;  Laterality: N/A;  .  TEE WITHOUT CARDIOVERSION  10/13/2017   Procedure: TRANSESOPHAGEAL ECHOCARDIOGRAM (TEE);  Surgeon: Delight OvensGerhardt, Edward B, MD;  [facility];  Service: Open Heart Surgery;;  . ULTRASOUND GUIDANCE FOR VASCULAR ACCESS  10/12/2017   Procedure: Ultrasound Guidance For Vascular Access;  Surgeon: Marykay LexHarding, David W, MD;  [facility];  Service: Cardiovascular;;     No outpatient medications have been marked as taking for  the 04/27/19 encounter (Appointment) with Baldo DaubMunley, Kenn Rekowski J, MD.     Allergies:   Patient has no known allergies.   Social History   Tobacco Use  . Smoking status: Never Smoker  . Smokeless tobacco: Never Used  Substance Use Topics  . Alcohol use: Never    Frequency: Never  . Drug use: Never     Family Hx: The patient's family history includes Diabetes in his brother and brother; Diabetes Mellitus II in his mother; Heart attack in his father; Hypertension in his mother; Sudden death in his brother.  ROS:   Please see the history of present illness.    All other systems reviewed and are negative.   Prior CV studies:   The following studies were reviewed today:    Labs/Other Tests and Data Reviewed:    EKG:  No ECG reviewed.  Recent Labs: 09/10/2018: BUN 20; Creatinine, Ser 1.23; Magnesium 2.2; Potassium 4.8; Sodium 144   Recent Lipid Panel Lab Results  Component Value Date/Time   CHOL 179 10/27/2018 04:50 PM   TRIG 244 (H) 10/27/2018 04:50 PM   HDL 55 10/27/2018 04:50 PM   CHOLHDL 3.3 10/27/2018 04:50 PM   CHOLHDL 4.4 10/12/2017 01:39 AM   LDLCALC 75 10/27/2018 04:50 PM    Wt Readings from Last 3 Encounters:  10/27/18 262 lb 2 oz (118.9 kg)  10/21/18 261 lb (118.4 kg)  09/10/18 261 lb (118.4 kg)     Objective:    Vital Signs:  There were no vitals taken for this visit.   VITAL SIGNS:  reviewed GEN:  no acute distress EYES:  sclerae anicteric, EOMI - Extraocular Movements Intact RESPIRATORY:  normal respiratory effort, symmetric expansion CARDIOVASCULAR:  no peripheral edema SKIN:  no rash, lesions or ulcers. MUSCULOSKELETAL:  no obvious deformities. NEURO:  alert and oriented x 3, no obvious focal deficit PSYCH:  normal affect  ASSESSMENT & PLAN:    1. CAD s/p CABGx4 -stable asymptomatic after revascularization continue current treatment including dual antiplatelet and PCSK9 therapy as he is intolerant of statins. 2. HLD - Lipid profile 10/2018 Total  179, HDL 55 LDL 75, Triglycerides 244.  He will have labs done next week for safety lipids and efficacy LDL cholesterol 3. PVC -improved asymptomatic continue a selective beta-blocker Sectral 4. COVID 19 infection brief self-limited is fully recovered.  I reassured him there are no reports of recurrence or second infection at this time in the [place] and when available should have convalescent serology to prove immunity  COVID-19 Education: The signs and symptoms of COVID-19 were discussed with the patient and how to seek care for testing (follow up with PCP or arrange E-visit).  The importance of social distancing was discussed today.  Time:   Today, I have spent 25 minutes with the patient with telehealth technology discussing the above problems.     Medication Adjustments/Labs and Tests Ordered: Current medicines are reviewed at length with the patient today.  Concerns regarding medicines are outlined above.   Tests Ordered: No orders of the defined types were placed in this encounter.  Medication Changes: No orders of the defined types were placed in this encounter.   Follow Up:  In Person December 2020  Signed, Shirlee More, MD  04/26/2019 11:43 AM    [facility]

## 2019-04-27 ENCOUNTER — Encounter: Payer: Self-pay | Admitting: Cardiology

## 2019-04-27 ENCOUNTER — Telehealth: Payer: Self-pay | Admitting: Cardiology

## 2019-04-27 ENCOUNTER — Other Ambulatory Visit: Payer: Self-pay

## 2019-04-27 ENCOUNTER — Telehealth (INDEPENDENT_AMBULATORY_CARE_PROVIDER_SITE_OTHER): Payer: Self-pay | Admitting: Cardiology

## 2019-04-27 VITALS — BP 127/86 | HR 60 | Wt 256.0 lb

## 2019-04-27 DIAGNOSIS — I493 Ventricular premature depolarization: Secondary | ICD-10-CM

## 2019-04-27 DIAGNOSIS — R030 Elevated blood-pressure reading, without diagnosis of hypertension: Secondary | ICD-10-CM

## 2019-04-27 DIAGNOSIS — I25118 Atherosclerotic heart disease of native coronary artery with other forms of angina pectoris: Secondary | ICD-10-CM

## 2019-04-27 DIAGNOSIS — E782 Mixed hyperlipidemia: Secondary | ICD-10-CM

## 2019-04-27 NOTE — Patient Instructions (Signed)
Medication Instructions:  Your physician recommends that you continue on your current medications as directed. Please refer to the Current Medication list given to you today.  If you need a refill on your cardiac medications before your next appointment, please call your pharmacy.   Lab work: Your physician recommends that you return for lab work in:  1-2 weeks LIPID,CMP  If you have labs (blood work) drawn today and your tests are completely normal, you will receive your results only by: Marland Kitchen MyChart Message (if you have MyChart) OR . A paper copy in the mail If you have any lab test that is abnormal or we need to change your treatment, we will call you to review the results.  Testing/Procedures: None  Follow-Up: At [facility], you and your health needs are our priority.  As part of our continuing mission to provide you with exceptional heart care, we have created designated Provider Care Teams.  These Care Teams include your primary Cardiologist (physician) and Advanced Practice Providers (APPs -  Physician Assistants and Nurse Practitioners) who all work together to provide you with the care you need, when you need it. You will need a follow up appointment in 6 months.  Any Other Special Instructions Will Be Listed Below (If Applicable).

## 2019-04-27 NOTE — Telephone Encounter (Signed)
Virtual Visit Pre-Appointment Phone Call  "(Name), I am calling you today to discuss your upcoming appointment. We are currently trying to limit exposure to the virus that causes COVID-19 by seeing patients at home rather than in the office."  1. "What is the BEST phone number to call the day of the visit?" - include this in appointment notes  2. Do you have or have access to (through a family member/friend) a smartphone with video capability that we can use for your visit?" a. If yes - list this number in appt notes as cell (if different from BEST phone #) and list the appointment type as a VIDEO visit in appointment notes b. If no - list the appointment type as a PHONE visit in appointment notes  3. Confirm consent - "In the setting of the current Covid19 crisis, you are scheduled for a (phone or video) visit with your provider on (date) at (time).  Just as we do with many in-office visits, in order for you to participate in this visit, we must obtain consent.  If you'd like, I can send this to your mychart (if signed up) or email for you to review.  Otherwise, I can obtain your verbal consent now.  All virtual visits are billed to your insurance company just like a normal visit would be.  By agreeing to a virtual visit, we'd like you to understand that the technology does not allow for your provider to perform an examination, and thus may limit your provider's ability to fully assess your condition. If your provider identifies any concerns that need to be evaluated in person, we will make arrangements to do so.  Finally, though the technology is pretty good, we cannot assure that it will always work on either your or our end, and in the setting of a video visit, we may have to convert it to a phone-only visit.  In either situation, we cannot ensure that we have a secure connection.  Are you willing to proceed?" STAFF: Did the patient verbally acknowledge consent to telehealth visit? Document  YES/NO here: yes per pt  4. Advise patient to be prepared - "Two hours prior to your appointment, go ahead and check your blood pressure, pulse, oxygen saturation, and your weight (if you have the equipment to check those) and write them all down. When your visit starts, your provider will ask you for this information. If you have an Apple Watch or Kardia device, please plan to have heart rate information ready on the day of your appointment. Please have a pen and paper handy nearby the day of the visit as well."  5. Give patient instructions for MyChart download to smartphone OR Doximity/Doxy.me as below if video visit (depending on what platform provider is using)  6. Inform patient they will receive a phone call 15 minutes prior to their appointment time (may be from unknown caller ID) so they should be prepared to answer    TELEPHONE CALL NOTE  Scott Rosales Holst has been deemed a candidate for a follow-up tele-health visit to limit community exposure during the Covid-19 pandemic. I spoke with the patient via phone to ensure availability of phone/video source, confirm preferred email & phone number, and discuss instructions and expectations.  I reminded Scott Rosales Cadiente to be prepared with any vital sign and/or heart rhythm information that could potentially be obtained via home monitoring, at the time of his visit. I reminded Scott Rosales [place] to expect a phone call  prior to his visit.  Scott Rosales 04/27/2019 10:08 AM   INSTRUCTIONS FOR DOWNLOADING THE MYCHART APP TO SMARTPHONE  - The patient must first make sure to have activated MyChart and know their login information - If Apple, go to [employer] and type in MyChart in the search bar and download the app. If Android, ask patient to go to [employer] and type in [place] in the search bar and download the app. The app is free but as with any other app downloads, their phone may require them to verify saved payment information or  Apple/Android password.  - The patient will need to then log into the app with their MyChart username and password, and select [facility] as their healthcare provider to link the account. When it is time for your visit, go to the MyChart app, find appointments, and click Begin Video Visit. Be sure to Select Allow for your device to access the Microphone and Camera for your visit. You will then be connected, and your provider will be with you shortly.  **If they have any issues connecting, or need assistance please contact MyChart service desk [phone])**  **If using a computer, in order to ensure the best quality for their visit they will need to use either of the following Internet Browsers: [employer], or Google Chrome**  IF USING DOXIMITY or DOXY.ME - The patient will receive a link just prior to their visit by text.     FULL LENGTH CONSENT FOR TELE-HEALTH VISIT   I hereby voluntarily request, consent and authorize [facility] and its employed or contracted physicians, physician assistants, nurse practitioners or other licensed health care professionals (the Practitioner), to provide me with telemedicine health care services (the Services") as deemed necessary by the treating Practitioner. I acknowledge and consent to receive the Services by the Practitioner via telemedicine. I understand that the telemedicine visit will involve communicating with the Practitioner through live audiovisual communication technology and the disclosure of certain medical information by electronic transmission. I acknowledge that I have been given the opportunity to request an in-person assessment or other available alternative prior to the telemedicine visit and am voluntarily participating in the telemedicine visit.  I understand that I have the right to withhold or withdraw my consent to the use of telemedicine in the course of my care at any time, without affecting my right to future care  or treatment, and that the Practitioner or I may terminate the telemedicine visit at any time. I understand that I have the right to inspect all information obtained and/or recorded in the course of the telemedicine visit and may receive copies of available information for a reasonable fee.  I understand that some of the potential risks of receiving the Services via telemedicine include:   Delay or interruption in medical evaluation due to technological equipment failure or disruption;  Information transmitted may not be sufficient (e.g. poor resolution of images) to allow for appropriate medical decision making by the Practitioner; and/or   In rare instances, security protocols could fail, causing a breach of personal health information.  Furthermore, I acknowledge that it is my responsibility to provide information about my medical history, conditions and care that is complete and accurate to the best of my ability. I acknowledge that Practitioner's advice, recommendations, and/or decision may be based on factors not within their control, such as incomplete or inaccurate data provided by me or distortions of diagnostic images or specimens that may result from electronic  transmissions. I understand that the practice of medicine is not an exact science and that Practitioner makes no warranties or guarantees regarding treatment outcomes. I acknowledge that I will receive a copy of this consent concurrently upon execution via email to the email address I last provided but may also request a printed copy by calling the office of [facility].    I understand that my insurance will be billed for this visit.   I have read or had this consent read to me.  I understand the contents of this consent, which adequately explains the benefits and risks of the Services being provided via telemedicine.   I have been provided ample opportunity to ask questions regarding this consent and the Services and have had  my questions answered to my satisfaction.  I give my informed consent for the services to be provided through the use of telemedicine in my medical care  By participating in this telemedicine visit I agree to the above.

## 2019-05-06 ENCOUNTER — Telehealth: Payer: Self-pay | Admitting: Cardiology

## 2019-05-06 MED ORDER — PRALUENT 75 MG/ML ~~LOC~~ SOAJ: 75.0000 mg | SUBCUTANEOUS | 1 refills | Status: DC

## 2019-05-06 NOTE — Telephone Encounter (Signed)
°*  STAT* If patient is at the pharmacy, call can be transferred to refill team.   1. Which medications need to be refilled? (please list name of each medication and dose if known) Alirocumab (PRALUENT) 75 MG/ML SOAJ   2. Which pharmacy/location (including street and city if local pharmacy) is medication to be sent to?  [facility] [place], [place] [address] (Phone) [phone] (Fax)    3. Do they need a 30 day or 90 day supply? 90 day

## 2019-05-06 NOTE — Telephone Encounter (Signed)
Praulent refill sent to [facility] in [facility]

## 2019-07-06 ENCOUNTER — Other Ambulatory Visit: Payer: Self-pay

## 2019-07-06 MED ORDER — ACEBUTOLOL HCL 400 MG PO CAPS: 400.0000 mg | ORAL_CAPSULE | Freq: Every day | ORAL | 1 refills | Status: DC

## 2019-08-11 ENCOUNTER — Inpatient Hospital Stay
Admission: AD | Admit: 2019-08-11 | Payer: Self-pay | Source: Other Acute Inpatient Hospital | Admitting: Cardiovascular Disease

## 2019-08-12 ENCOUNTER — Inpatient Hospital Stay (HOSPITAL_COMMUNITY)
Admission: AD | Disposition: A | Discharge status: Home / Self Care | Payer: Self-pay | Source: Other Acute Inpatient Hospital | Attending: Internal Medicine

## 2019-08-12 ENCOUNTER — Inpatient Hospital Stay (HOSPITAL_COMMUNITY)
Admission: AD | Admit: 2019-08-12 | Discharge: 2019-08-13 | DRG: 247 | Disposition: A | Discharge status: Home / Self Care | Payer: 59 | Source: Other Acute Inpatient Hospital | Attending: Internal Medicine | Admitting: Internal Medicine

## 2019-08-12 DIAGNOSIS — I442 Atrioventricular block, complete: Secondary | ICD-10-CM

## 2019-08-12 DIAGNOSIS — I2510 Atherosclerotic heart disease of native coronary artery without angina pectoris: Secondary | ICD-10-CM | POA: Diagnosis present

## 2019-08-12 DIAGNOSIS — N182 Chronic kidney disease, stage 2 (mild): Secondary | ICD-10-CM | POA: Diagnosis present

## 2019-08-12 DIAGNOSIS — K219 Gastro-esophageal reflux disease without esophagitis: Secondary | ICD-10-CM | POA: Diagnosis present

## 2019-08-12 DIAGNOSIS — I129 Hypertensive chronic kidney disease with stage 1 through stage 4 chronic kidney disease, or unspecified chronic kidney disease: Secondary | ICD-10-CM | POA: Diagnosis present

## 2019-08-12 DIAGNOSIS — E785 Hyperlipidemia, unspecified: Secondary | ICD-10-CM | POA: Diagnosis present

## 2019-08-12 DIAGNOSIS — I252 Old myocardial infarction: Secondary | ICD-10-CM

## 2019-08-12 DIAGNOSIS — E7800 Pure hypercholesterolemia, unspecified: Secondary | ICD-10-CM | POA: Diagnosis present

## 2019-08-12 DIAGNOSIS — Z955 Presence of coronary angioplasty implant and graft: Secondary | ICD-10-CM

## 2019-08-12 DIAGNOSIS — Z20828 Contact with and (suspected) exposure to other viral communicable diseases: Secondary | ICD-10-CM | POA: Diagnosis present

## 2019-08-12 DIAGNOSIS — I25810 Atherosclerosis of coronary artery bypass graft(s) without angina pectoris: Secondary | ICD-10-CM | POA: Diagnosis present

## 2019-08-12 DIAGNOSIS — I498 Other specified cardiac arrhythmias: Secondary | ICD-10-CM

## 2019-08-12 DIAGNOSIS — Z8619 Personal history of other infectious and parasitic diseases: Secondary | ICD-10-CM | POA: Diagnosis not present

## 2019-08-12 DIAGNOSIS — Z7902 Long term (current) use of antithrombotics/antiplatelets: Secondary | ICD-10-CM

## 2019-08-12 DIAGNOSIS — I214 Non-ST elevation (NSTEMI) myocardial infarction: Principal | ICD-10-CM

## 2019-08-12 DIAGNOSIS — Z8249 Family history of ischemic heart disease and other diseases of the circulatory system: Secondary | ICD-10-CM

## 2019-08-12 DIAGNOSIS — Z79899 Other long term (current) drug therapy: Secondary | ICD-10-CM

## 2019-08-12 DIAGNOSIS — Z951 Presence of aortocoronary bypass graft: Secondary | ICD-10-CM

## 2019-08-12 DIAGNOSIS — Z7982 Long term (current) use of aspirin: Secondary | ICD-10-CM

## 2019-08-12 DIAGNOSIS — I10 Essential (primary) hypertension: Secondary | ICD-10-CM

## 2019-08-12 HISTORY — DX: Atrioventricular block, complete: I44.2

## 2019-08-12 HISTORY — PX: CORONARY STENT INTERVENTION: CATH118234

## 2019-08-12 HISTORY — DX: Chronic kidney disease, stage 2 (mild): N18.2

## 2019-08-12 HISTORY — PX: LEFT HEART CATH AND CORS/GRAFTS ANGIOGRAPHY: CATH118250

## 2019-08-12 LAB — CBC
HCT: 44.6 % (ref 39.0–52.0)
Hemoglobin: 15 g/dL (ref 13.0–17.0)
MCH: 31.6 pg (ref 26.0–34.0)
MCHC: 33.6 g/dL (ref 30.0–36.0)
MCV: 94.1 fL (ref 80.0–100.0)
Platelets: 171 10*3/uL (ref 150–400)
RBC: 4.74 MIL/uL (ref 4.22–5.81)
RDW: 13.2 % (ref 11.5–15.5)
WBC: 4.9 10*3/uL (ref 4.0–10.5)
nRBC: 0 % (ref 0.0–0.2)

## 2019-08-12 LAB — POCT ACTIVATED CLOTTING TIME
Activated Clotting Time: 268 seconds
Activated Clotting Time: 296 seconds

## 2019-08-12 LAB — CREATININE, SERUM
Creatinine, Ser: 1.22 mg/dL (ref 0.61–1.24)
GFR calc Af Amer: >60 mL/min (ref >60)
GFR calc non Af Amer: >60 mL/min (ref >60)

## 2019-08-12 SURGERY — LEFT HEART CATH AND CORS/GRAFTS ANGIOGRAPHY
Anesthesia: LOCAL

## 2019-08-12 MED ORDER — LIDOCAINE HCL (PF) 1 % IJ SOLN
INTRAMUSCULAR | Status: AC
  Filled 2019-08-12: qty 30

## 2019-08-12 MED ORDER — VERAPAMIL HCL 2.5 MG/ML IV SOLN
INTRAVENOUS | Status: DC | PRN
  Administered 2019-08-12: 10 mL via INTRA_ARTERIAL

## 2019-08-12 MED ORDER — VERAPAMIL HCL 2.5 MG/ML IV SOLN
INTRAVENOUS | Status: AC
  Filled 2019-08-12: qty 2

## 2019-08-12 MED ORDER — HEPARIN SODIUM (PORCINE) 1000 UNIT/ML IJ SOLN
INTRAMUSCULAR | Status: AC
  Filled 2019-08-12: qty 1

## 2019-08-12 MED ORDER — ACETAMINOPHEN 325 MG PO TABS: 650.0000 mg | ORAL_TABLET | ORAL | Status: DC | PRN

## 2019-08-12 MED ORDER — FENTANYL CITRATE (PF) 100 MCG/2ML IJ SOLN
INTRAMUSCULAR | Status: DC | PRN
  Administered 2019-08-12: 50 ug via INTRAVENOUS
  Administered 2019-08-12: 25 ug via INTRAVENOUS

## 2019-08-12 MED ORDER — LABETALOL HCL 5 MG/ML IV SOLN: 10.0000 mg | INTRAVENOUS | Status: AC | PRN

## 2019-08-12 MED ORDER — PRASUGREL HCL 10 MG PO TABS
10.0000 mg | ORAL_TABLET | Freq: Every day | ORAL | Status: DC
  Administered 2019-08-13: 11:00:00 10 mg via ORAL
  Filled 2019-08-12: qty 1

## 2019-08-12 MED ORDER — HEPARIN (PORCINE) IN NACL 25000-0.45 UT/250ML-% IV SOLN
12.00 | INTRAVENOUS | Status: DC
Start: ? — End: 2019-08-12

## 2019-08-12 MED ORDER — HEPARIN SODIUM (PORCINE) 1000 UNIT/ML IJ SOLN
2000.00 | INTRAMUSCULAR | Status: DC
Start: ? — End: 2019-08-12

## 2019-08-12 MED ORDER — IOHEXOL 350 MG/ML SOLN
INTRAVENOUS | Status: DC | PRN
  Administered 2019-08-12: 15:00:00 195 mL

## 2019-08-12 MED ORDER — SODIUM CHLORIDE 0.9 % IV SOLN: 250.0000 mL | INTRAVENOUS | Status: DC | PRN

## 2019-08-12 MED ORDER — SODIUM CHLORIDE 0.9 % IV SOLN
INTRAVENOUS | Status: AC
  Administered 2019-08-12: 16:00:00 via INTRAVENOUS

## 2019-08-12 MED ORDER — TIROFIBAN HCL IN NACL 5-0.9 MG/100ML-% IV SOLN
INTRAVENOUS | Status: AC
  Filled 2019-08-12: qty 100

## 2019-08-12 MED ORDER — SODIUM CHLORIDE 0.9% FLUSH: 3.0000 mL | INTRAVENOUS | Status: DC | PRN

## 2019-08-12 MED ORDER — HEPARIN SODIUM (PORCINE) 1000 UNIT/ML IJ SOLN
INTRAMUSCULAR | Status: DC | PRN
  Administered 2019-08-12: 5000 [IU] via INTRAVENOUS
  Administered 2019-08-12: 7000 [IU] via INTRAVENOUS

## 2019-08-12 MED ORDER — HYDRALAZINE HCL 20 MG/ML IJ SOLN: 10.0000 mg | INTRAMUSCULAR | Status: AC | PRN

## 2019-08-12 MED ORDER — HEPARIN (PORCINE) IN NACL 1000-0.9 UT/500ML-% IV SOLN
INTRAVENOUS | Status: AC
  Filled 2019-08-12: qty 1000

## 2019-08-12 MED ORDER — PRASUGREL HCL 10 MG PO TABS
ORAL_TABLET | ORAL | Status: AC
  Filled 2019-08-12: qty 1

## 2019-08-12 MED ORDER — TIROFIBAN (AGGRASTAT) BOLUS VIA INFUSION
INTRAVENOUS | Status: DC | PRN
  Administered 2019-08-12: 2925 ug via INTRAVENOUS

## 2019-08-12 MED ORDER — ENOXAPARIN SODIUM 40 MG/0.4ML ~~LOC~~ SOLN
40.0000 mg | SUBCUTANEOUS | Status: DC
  Administered 2019-08-13: 08:00:00 40 mg via SUBCUTANEOUS
  Filled 2019-08-12: qty 0.4

## 2019-08-12 MED ORDER — ASPIRIN EC 81 MG PO TBEC
81.0000 mg | DELAYED_RELEASE_TABLET | Freq: Every day | ORAL | Status: DC
  Administered 2019-08-13: 81 mg via ORAL
  Filled 2019-08-12: qty 1

## 2019-08-12 MED ORDER — SODIUM CHLORIDE 0.9% FLUSH
3.0000 mL | Freq: Two times a day (BID) | INTRAVENOUS | Status: DC
  Administered 2019-08-13: 3 mL via INTRAVENOUS

## 2019-08-12 MED ORDER — LIDOCAINE HCL (PF) 1 % IJ SOLN
INTRAMUSCULAR | Status: DC | PRN
  Administered 2019-08-12: 2 mL via SUBCUTANEOUS

## 2019-08-12 MED ORDER — NITROGLYCERIN 0.4 MG SL SUBL
0.40 | SUBLINGUAL_TABLET | SUBLINGUAL | Status: DC
Start: ? — End: 2019-08-12

## 2019-08-12 MED ORDER — MIDAZOLAM HCL 2 MG/2ML IJ SOLN
INTRAMUSCULAR | Status: AC
  Filled 2019-08-12: qty 2

## 2019-08-12 MED ORDER — TIROFIBAN HCL IN NACL 5-0.9 MG/100ML-% IV SOLN
0.1500 ug/kg/min | INTRAVENOUS | Status: AC
  Administered 2019-08-12 – 2019-08-13 (×4): 0.15 ug/kg/min via INTRAVENOUS
  Filled 2019-08-12 (×6): qty 100

## 2019-08-12 MED ORDER — NITROGLYCERIN 1 MG/10 ML FOR IR/CATH LAB
INTRA_ARTERIAL | Status: DC | PRN
  Administered 2019-08-12: 200 ug via INTRACORONARY

## 2019-08-12 MED ORDER — ACEBUTOLOL HCL 200 MG PO CAPS
400.0000 mg | ORAL_CAPSULE | Freq: Every day | ORAL | Status: DC
  Administered 2019-08-12 – 2019-08-13 (×2): 400 mg via ORAL
  Filled 2019-08-12 (×2): qty 2
  Filled 2019-08-12: qty 1

## 2019-08-12 MED ORDER — NITROGLYCERIN 1 MG/10 ML FOR IR/CATH LAB
INTRA_ARTERIAL | Status: AC
  Filled 2019-08-12: qty 10

## 2019-08-12 MED ORDER — MIDAZOLAM HCL 2 MG/2ML IJ SOLN
INTRAMUSCULAR | Status: DC | PRN
  Administered 2019-08-12 (×2): 1 mg via INTRAVENOUS

## 2019-08-12 MED ORDER — HEPARIN (PORCINE) IN NACL 1000-0.9 UT/500ML-% IV SOLN
INTRAVENOUS | Status: DC | PRN
  Administered 2019-08-12 (×2): 500 mL

## 2019-08-12 MED ORDER — FENTANYL CITRATE (PF) 100 MCG/2ML IJ SOLN
INTRAMUSCULAR | Status: AC
  Filled 2019-08-12: qty 2

## 2019-08-12 MED ORDER — ONDANSETRON HCL 4 MG/2ML IJ SOLN: 4.0000 mg | Freq: Four times a day (QID) | INTRAMUSCULAR | Status: DC | PRN

## 2019-08-12 MED ORDER — TIROFIBAN HCL IV 12.5 MG/250 ML
INTRAVENOUS | Status: AC | PRN
  Administered 2019-08-12 (×2): 0.15 ug/kg/min via INTRAVENOUS

## 2019-08-12 SURGICAL SUPPLY — 19 items
BALLN EMERGE MR 2.0X8 (BALLOONS) ×2
BALLN SAPPHIRE ~~LOC~~ 2.75X8 (BALLOONS) ×2 IMPLANT
BALLOON EMERGE MR 2.0X8 (BALLOONS) ×1 IMPLANT
CATH DXT MULTI JL4 JR4 ANG PIG (CATHETERS) ×2 IMPLANT
CATH EXTRAC PRONTO 5.5F 138CM (CATHETERS) ×2 IMPLANT
CATH INFINITI 5 FR IM (CATHETERS) ×2 IMPLANT
CATH LAUNCHER 6FR EBU 3.75 (CATHETERS) ×2 IMPLANT
DEVICE RAD COMP TR BAND LRG (VASCULAR PRODUCTS) ×2 IMPLANT
GLIDESHEATH SLEND SS 6F .021 (SHEATH) ×2 IMPLANT
GUIDEWIRE INQWIRE 1.5J.035X260 (WIRE) ×1 IMPLANT
INQWIRE 1.5J .035X260CM (WIRE) ×2
KIT ENCORE 26 ADVANTAGE (KITS) ×2 IMPLANT
KIT HEART LEFT (KITS) ×2 IMPLANT
PACK CARDIAC CATHETERIZATION (CUSTOM PROCEDURE TRAY) ×2 IMPLANT
STENT RESOLUTE ONYX 2.5X12 (Permanent Stent) ×2 IMPLANT
SYR MEDRAD MARK 7 150ML (SYRINGE) ×2 IMPLANT
TRANSDUCER W/STOPCOCK (MISCELLANEOUS) ×2 IMPLANT
TUBING CIL FLEX 10 FLL-RA (TUBING) ×2 IMPLANT
WIRE RUNTHROUGH .014X180CM (WIRE) ×2 IMPLANT

## 2019-08-12 NOTE — H&P (Addendum)
Cardiology Admission History and Physical:   Patient ID: Scott Rosales MRN: [id number]; DOB: [birth year]   Admission date: 08/12/2019  Primary Care Provider: Cain Sieve, MD Primary Cardiologist: Norman Herrlich, MD  Chief Complaint:  Chest pain   Patient Profile:   Scott Rosales is a 58 y.o. male with hx of CAD s/p NSTEMI and CABG 10/13/2017, HLD and GERD transferred form [facility] with unstable angina.   Previous patient of Dr. Donnie Aho. He underwent CABG in 10/2017 with LIMA to LAD, SVG to OM1-2, SVG to PDA. Echo 10/2017 showed LVEF of 55-60%. Monitor 09/2018 without arrthytmias with PACs and PVCs. Normal perfusion study 10/2018.  Tested positive for Covid 03/30/2019.   History of Present Illness:   Scott Rosales presented to [facility] with chest pain yesterday. EKG with TWI/ST depression in anterior lateral leads. Admitted for unstable angina/NSTEMI. Started on heparin drip. Potassium corrected. Symptoms similar prior to CABG. Last troponin level 24. K 4.1. Creatinine 1.1. Na 138. Transferred to [facility] for cath. Intermittent pain overnight. No nitro patch. Chest pain free here.   Heart Pathway Score:     Past Medical History:  Diagnosis Date  . CAD (coronary artery disease), native coronary artery 10/15/2017   Non STEMI 12/18 Cardiac cath 10/12/17  PDA100%, circ bifurcation mid and OM 95%, 70% LAD diffuse with FFR .79%  CABG 12/3 Dr. Tyrone Sage with LIMA to LAD, SVG to OM1-2, SVG to PDA  . GERD (gastroesophageal reflux disease) 06/14/2013  . Hypercholesteremia 05/19/2007  . Hyperlipidemia 10/16/2017  . Pre-hypertension 06/14/2013  . 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 CARDIAC CATHETERIZATION    . CORONARY ARTERY BYPASS GRAFT N/A 10/13/2017   Procedure: CORONARY ARTERY BYPASS GRAFTING (CABG) x 4, LIMA to LAD, SVG to OM1 and distal circ, SVG to PDA, using left internal mammary artery and right greater saphenous vein harvested  endoscopically;  Surgeon: Delight Ovens, MD;  Location: [facility] OR;  Service: Open Heart Surgery;  Laterality: N/A;  . INTRAVASCULAR PRESSURE WIRE/FFR STUDY N/A 10/12/2017   Procedure: INTRAVASCULAR PRESSURE WIRE/FFR STUDY;  Surgeon: Marykay Lex, MD;  Location: [facility] INVASIVE CV LAB;  Service: Cardiovascular;  Laterality: N/A;  . LEFT HEART CATH AND CORONARY ANGIOGRAPHY N/A 10/12/2017   Procedure: LEFT HEART CATH AND CORONARY ANGIOGRAPHY;  Surgeon: Marykay Lex, MD;  Location: [facility] INVASIVE CV LAB;  Service: Cardiovascular;  Laterality: N/A;  . TEE WITHOUT CARDIOVERSION  10/13/2017   Procedure: TRANSESOPHAGEAL ECHOCARDIOGRAM (TEE);  Surgeon: Delight Ovens, MD;  Location: [facility] OR;  Service: Open Heart Surgery;;  . ULTRASOUND GUIDANCE FOR VASCULAR ACCESS  10/12/2017   Procedure: Ultrasound Guidance For Vascular Access;  Surgeon: Marykay Lex, MD;  Location: [facility] INVASIVE CV LAB;  Service: Cardiovascular;;     Medications Prior to Admission: Prior to Admission medications   Medication Sig Start Date Rodger Giangregorio Date Taking? Authorizing Provider  acebutolol (SECTRAL) 400 MG capsule Take 1 capsule (400 mg total) by mouth daily. 07/06/19   Baldo Daub, MD  Alirocumab (PRALUENT) 75 MG/ML SOAJ Inject 75 mg into the skin every 14 (fourteen) days. 05/06/19   Baldo Daub, MD  aspirin EC 81 MG tablet Take 81 mg by mouth daily.    [provider]  clopidogrel (PLAVIX) 75 MG tablet Take 1 tablet (75 mg total) by mouth daily. 10/17/17   Ardelle Balls, PA-C  docusate sodium (COLACE) 100 MG capsule Take 100 mg by mouth daily.  [provider]  vitamin C (ASCORBIC ACID) 500 MG tablet Take 500 mg by mouth daily.    [provider]     Allergies:   No Known Allergies  Social History:   Social History   Tobacco Use  . Smoking status: Never Smoker  . Smokeless tobacco: Never Used  Substance Use Topics  . Alcohol use: Never    Frequency: Never  . Drug use: Never      Family History:   The patient's family history includes Diabetes in his brother and brother; Diabetes Mellitus II in his mother; Heart attack in his father; Hypertension in his mother; Sudden death in his brother.    ROS:  Please see the history of present illness.  All other ROS reviewed and negative.     Physical Exam/Data:  There were no vitals filed for this visit. No intake or output data in the 24 hours ending 08/12/19 1015 Last 3 Weights 04/27/2019 10/27/2018 10/21/2018  Weight (lbs) 256 lb 262 lb 2 oz 261 lb  Weight (kg) 116.121 kg 118.899 kg 118.389 kg     There is no height or weight on file to calculate BMI.  General:  Well nourished, well developed, in no acute distress HEENT: normal Lymph: no adenopathy Neck: no JVD Endocrine:  No thryomegaly Vascular: No carotid bruits; FA pulses 2+ bilaterally without bruits  Cardiac:  normal S1, S2; RRR; no murmur  Lungs:  clear to auscultation bilaterally, no wheezing, rhonchi or rales  Abd: soft, nontender, no hepatomegaly  Ext: no  edema Musculoskeletal:  No deformities, BUE and BLE strength normal and equal Skin: warm and dry  Neuro:  CNs 2-12 intact, no focal abnormalities noted Psych:  Normal affect    EKG:  The ECG that was done 08/11/2019 was personally reviewed and demonstrates sinus rhythm with TWI anterior laterally   Relevant CV Studies:  Stress test 10/2018  Nuclear stress EF: 68%.  There was no ST segment deviation noted during stress.  The study is normal.  The left ventricular ejection fraction is hyperdynamic (>65%).   1. EF 68%, normal wall motion.  2. No evidence for ischemia or infarction by perfusion images.   Normal study.   Monitor 09/2018 A ZIO monitor was performed for 12 days 10 hours in order to assess palpitation.  The rhythm is sinus with minimum average and maximum heart rates of 44, 64 and 136 bpm.  There were no pauses of 3 seconds or greater, no episodes of sinus node or AV block.   Supraventricular ectopy is rare with isolated APCs and couplets and 2 runs of atrial premature beats the longest 12 beats in duration a rate of 101 bpm, the fastest 8 beats at a rate of 119 bpm.  There are no episodes of atrial fibrillation or flutter.  Ventricular ectopy is rare with isolated PVCs and couplets.  There is one ventricular triplet and 2 brief runs of PVCs the longest 7 beats at a rate of 83 bpm the fastest 5 complexes rate of 144 bpm.  There were 15 triggered and symptomatic events present predominantly associated with frequent PVCs   Conclusion rare supraventricular ventricular ectopy, however PVCs are symptomatic.  Laboratory Data:  Reviewed lab from outside hospital   Radiology/Studies:  No results found.  Assessment and Plan:   1. NSTEMI - Troponin peaked at 24. EKG with TWI in anterior alteral leads. On heparin, ASA, Plavix.  - Taken to cath directly.  - Chest pain free in  cath holding area  2. CAD s/p CABG - On ASA and plavix  3. HLD - Statin intolerance. On Praluent. Check Lipid panel.   Severity of Illness: The appropriate patient status for this patient is INPATIENT. Inpatient status is judged to be reasonable and necessary in order to provide the required intensity of service to ensure the patient's safety. The patient's presenting symptoms, physical exam findings, and initial radiographic and laboratory data in the context of their chronic comorbidities is felt to place them at high risk for further clinical deterioration. Furthermore, it is not anticipated that the patient will be medically stable for discharge from the hospital within 2 midnights of admission. The following factors support the patient status of inpatient.   " The patient's presenting symptoms include. CP " The worrisome physical exam findings include B/A " The initial radiographic and laboratory data are worrisome because of NSTEMI " The chronic co-morbidities include CABG   *  I certify that at the point of admission it is my clinical judgment that the patient will require inpatient hospital care spanning beyond 2 midnights from the point of admission due to high intensity of service, high risk for further deterioration and high frequency of surveillance required.*    For questions or updates, please contact [facility] Please consult [email or web address] for contact info under    Signed, Manson PasseyBhavinkumar Bhagat, PA  08/12/2019 10:15 AM   I have independently seen and examined the patient and agree with the findings and plan, as documented in the PA/NP's note, with the following additions/changes.  Ms. Elmore GuiseRooks is a 58 year old man with history of CAD status post CABG in 10/2017 in the setting of STEMI, hyperlipidemia, and GERD, [facility] with [facility]STEMI.  He reports developing chest pain yesterday, worsened with minimal activity.  [facility] and was found to have rising troponin.  He is currently asymptomatic except for intermittent palpitations.  Exam is notable for regular rate and rhythm without murmurs.  Lungs are clear.  Abdomen is soft and nontender.  There is no lower extremity edema.  Radial pulses are 2+ bilaterally.  Notable for rising troponin, most recently 32.1 this morning.  Hemoglobin is 12.8, creatinine 1.1, potassium 4.1.  Outside EKGs performed yesterday were personally reviewed and show dynamic ST/T changes in the inferior leads as well as persistent anterior T wave inversions and subtle ST depression.  In summary, stuporous 58 year old male with history of CAD status post CABG almost 2 years ago presenting with new chest pain and elevated troponin consistent with NSTEMI.  I have recommended that we proceed with urgent cardiac catheterization and possible PCI.  I have reviewed the risks, indications, and alternatives to cardiac catheterization, possible angioplasty, and stenting with the patient. Risks include but are not  limited to bleeding, infection, vascular injury, stroke, myocardial infection, arrhythmia, kidney injury, radiation-related injury in the case of prolonged fluoroscopy use, emergency cardiac surgery, and death. The patient understands the risks of serious complication is 1-2 in 1000 with diagnostic cardiac cath and 1-2% or less with angioplasty/stenting.  In the meantime, he should remain on aspirin, clopidogrel, and heparin.  Aggressive secondary prevention with ALIROCUMAB should be continued.  Yvonne Kendallhristopher Cimberly Stoffel, MD [facility] Pager: [phone]

## 2019-08-12 NOTE — Interval H&P Note (Signed)
History and Physical Interval Note:  08/12/2019 12:35 PM  Scott Rosales  has presented today for cardiac catheterization, with the diagnosis of NSTEMI.  The various methods of treatment have been discussed with the patient and family. After consideration of risks, benefits and other options for treatment, the patient has consented to  Procedure(s): LEFT HEART CATH AND CORS/GRAFTS ANGIOGRAPHY (N/A) as a surgical intervention.  The patient's history has been reviewed, patient examined, no change in status, stable for surgery.  I have reviewed the patient's chart and labs.  Questions were answered to the patient's satisfaction.    Cath Lab Visit (complete for each Cath Lab visit)  Clinical Evaluation Leading to the Procedure:   ACS: Yes.    Non-ACS:  N/A  Jame Seelig

## 2019-08-13 ENCOUNTER — Inpatient Hospital Stay (HOSPITAL_COMMUNITY): Payer: 59

## 2019-08-13 ENCOUNTER — Encounter (HOSPITAL_COMMUNITY): Payer: Self-pay | Admitting: Internal Medicine

## 2019-08-13 ENCOUNTER — Telehealth: Payer: Self-pay | Admitting: Family

## 2019-08-13 DIAGNOSIS — I214 Non-ST elevation (NSTEMI) myocardial infarction: Secondary | ICD-10-CM

## 2019-08-13 DIAGNOSIS — I442 Atrioventricular block, complete: Secondary | ICD-10-CM

## 2019-08-13 DIAGNOSIS — I10 Essential (primary) hypertension: Secondary | ICD-10-CM

## 2019-08-13 DIAGNOSIS — I498 Other specified cardiac arrhythmias: Secondary | ICD-10-CM

## 2019-08-13 HISTORY — DX: Other specified cardiac arrhythmias: I49.8

## 2019-08-13 LAB — ECHOCARDIOGRAM COMPLETE: Weight: 4139.2 oz

## 2019-08-13 LAB — CBC
HCT: 39.4 % (ref 39.0–52.0)
Hemoglobin: 13.6 g/dL (ref 13.0–17.0)
MCH: 31.9 pg (ref 26.0–34.0)
MCHC: 34.5 g/dL (ref 30.0–36.0)
MCV: 92.5 fL (ref 80.0–100.0)
Platelets: 152 10*3/uL (ref 150–400)
RBC: 4.26 MIL/uL (ref 4.22–5.81)
RDW: 13.2 % (ref 11.5–15.5)
WBC: 4.3 10*3/uL (ref 4.0–10.5)
nRBC: 0 % (ref 0.0–0.2)

## 2019-08-13 LAB — BASIC METABOLIC PANEL
Anion gap: 7 (ref 5–15)
BUN: 19 mg/dL (ref 6–20)
CO2: 25 mmol/L (ref 22–32)
Calcium: 8.6 mg/dL — ABNORMAL LOW (ref 8.9–10.3)
Chloride: 106 mmol/L (ref 98–111)
Creatinine, Ser: 1.27 mg/dL — ABNORMAL HIGH (ref 0.61–1.24)
GFR calc Af Amer: >60 mL/min (ref >60)
GFR calc non Af Amer: >60 mL/min (ref >60)
Glucose, Bld: 91 mg/dL (ref 70–99)
Potassium: 4.3 mmol/L (ref 3.5–5.1)
Sodium: 138 mmol/L (ref 135–145)

## 2019-08-13 LAB — MAGNESIUM: Magnesium: 2.2 mg/dL (ref 1.7–2.4)

## 2019-08-13 MED ORDER — ACEBUTOLOL HCL 200 MG PO CAPS: 200.0000 mg | ORAL_CAPSULE | Freq: Every day | ORAL | 1 refills | Status: DC

## 2019-08-13 MED ORDER — ACEBUTOLOL HCL 200 MG PO CAPS: 200.0000 mg | ORAL_CAPSULE | Freq: Every day | ORAL | Status: DC

## 2019-08-13 MED ORDER — ACEBUTOLOL HCL 200 MG PO CAPS: 200.0000 mg | ORAL_CAPSULE | Freq: Every day | ORAL | 3 refills | Status: DC

## 2019-08-13 MED ORDER — ACEBUTOLOL HCL 200 MG PO CAPS: 200.0000 mg | ORAL_CAPSULE | Freq: Every day | ORAL | 0 refills | Status: DC

## 2019-08-13 MED ORDER — PRASUGREL HCL 10 MG PO TABS: 10.0000 mg | ORAL_TABLET | Freq: Every day | ORAL | 11 refills | Status: DC

## 2019-08-13 MED FILL — PRASUGREL HCL 10 MG TABS: 10 | 30 days supply | Qty: 30 | Fill #0

## 2019-08-13 MED FILL — Prasugrel HCl Tab 10 MG (Base Equiv): ORAL | Qty: 3 | Status: AC

## 2019-08-13 NOTE — Progress Notes (Signed)
ANTICOAGULATION CONSULT NOTE - Follow Up Consult  Pharmacy Consult for aggrastat (tirofiban) Indication: NSTEMI  No Known Allergies  Patient Measurements: Weight: 258 lb 11.2 oz (117.3 kg)  Vital Signs: Temp: 98.4 F (36.9 C) (10/02 0601) Temp Source: Oral (10/02 0601) BP: 114/71 (10/02 0551) Pulse Rate: 62 (10/02 0551)  Labs: Recent Labs    08/12/19 1550 08/13/19 0342  HGB 15.0 13.6  HCT 44.6 39.4  PLT 171 152  CREATININE 1.22 1.27*    CrCl cannot be calculated (Unknown ideal weight.).   Medications:  Infusions:  . sodium chloride    . tirofiban 0.15 mcg/kg/min (08/13/19 1057)    Assessment: 58 y/o male with PMH of CAD s/p NSTEMI and CABG 10/13/2017, HLD and GERD. Transferred form [facility] with unstable angina. Found to have NSTEMI on EKG. Cardiac cath on 10/1 > angioplasty with drug eluding stent placed. Residual thrombus noted. Pharmacy consulted to dose and monitor Aggrastat.   CBC WNL and no active bleeding.  Goal of Therapy:  Monitor platelets by anticoagulation protocol: Yes   Plan:  Continue Aggrastat at 0.15 mcg/kg/min IV until 1600 on 10/2 which will complete 24 hrs of therapy Monitor for s/sx of bleeding  Kennon Holter, PharmD PGY1 Ambulatory Care Pharmacy Resident [employer] Phone: [phone] 08/13/2019,1:52 PM

## 2019-08-13 NOTE — Progress Notes (Signed)
[id number] Pt still on aggrastat until pm. Has been up in room and performed am care without CP. Encouraged him to walk in halls after infusion complete. MI education completed with pt who voiced understanding. Stressed importance of effient with stent. Reviewed NTG use( pt stated it drops his BP so he will call 911 instead of using), MI restrictions, heart healthy food choices, ex ed and CRP 2. Pt attended [facility] CRP 2 before. Will refer back. Graylon Good RN BSN 08/13/2019 10:10 AM

## 2019-08-13 NOTE — Telephone Encounter (Signed)
° ° °  TOC appt 10/14 with Gilford Rile

## 2019-08-13 NOTE — TOC Benefit Eligibility Note (Signed)
Transition of Care [facility]) Benefit Eligibility Note    Patient Details  Name: Scott Rosales MRN: [id number] Date of Birth: 1961/05/12   Medication/Dose: PRASUGREL  TABLET 10 MG DAILY  Covered?: Yes  Tier: 3 Drug  Prescription Coverage Preferred Pharmacy: Roseanne Kaufman with Person/Company/Phone Number:: [facility]  @ [employer] [place] # [phone]  Co-Pay: $12.84  Prior Approval: No  Deductible: (NO DEDUCTIBLE)  Additional Notes: EFFIENT  TABLET 10 MG DAILY : PLAN EXCLUSION # FOR APPEAL # [phone]    Memory Argue Phone Number: 08/13/2019, 12:35 PM

## 2019-08-13 NOTE — Progress Notes (Addendum)
Progress Note  Patient Name: Scott Rosales Date of Encounter: 08/13/2019  Primary Cardiologist: Norman Herrlich, MD  Subjective   Feeling much better this AM. No CP or SOB reported. Currently getting echo done. No issues with L radial site. He does wish to use [facility] pharmacy if possible.  Inpatient Medications    Scheduled Meds:  acebutolol  400 mg Oral Daily   aspirin EC  81 mg Oral Daily   enoxaparin (LOVENOX) injection  40 mg Subcutaneous Q24H   prasugrel  10 mg Oral Daily   sodium chloride flush  3 mL Intravenous Q12H   Continuous Infusions:  sodium chloride     tirofiban 0.15 mcg/kg/min (08/13/19 0700)   PRN Meds: sodium chloride, acetaminophen, ondansetron (ZOFRAN) IV, sodium chloride flush   Vital Signs    Vitals:   08/12/19 2136 08/12/19 2153 08/13/19 0551 08/13/19 0601  BP: 106/64  114/71   Pulse: (!) 59  62   Resp:      Temp:  98.2 F (36.8 C)  98.4 F (36.9 C)  TempSrc:  Oral  Oral  SpO2: 98%  98%   Weight:    117.3 kg    Intake/Output Summary (Last 24 hours) at 08/13/2019 0759 Last data filed at 08/13/2019 0754 Gross per 24 hour  Intake 1500.45 ml  Output 1125 ml  Net 375.45 ml   Last 3 Weights 08/13/2019 08/12/2019 04/27/2019  Weight (lbs) 258 lb 11.2 oz 257 lb 15 oz 256 lb  Weight (kg) 117.346 kg 117 kg 116.121 kg     Telemetry    NSR, 18 beat run AIVR last night - Personally Reviewed  ECG    SB 58bpm, TWI I, avL, V2-V6 (flattening by V6), QTc - Personally Reviewed  Physical Exam   GEN: No acute distress.  HEENT: Normocephalic, atraumatic, sclera non-icteric. Neck: No JVD or bruits. Cardiac: RRR no murmurs, rubs, or gallops.  Radials/DP/PT 1+ and equal bilaterally.  Respiratory: Clear to auscultation bilaterally. Breathing is unlabored. GI: Soft, nontender, non-distended, BS +x 4. MS: no deformity. Extremities: No clubbing or cyanosis. No edema. Distal pedal pulses are 2+ and equal bilaterally. Left radial site c/d/i, no  hematoma Neuro:  AAOx3. Follows commands. Psych:  Responds to questions appropriately with a normal affect.  Labs    High Sensitivity Troponin:  No results for input(s): TROPONINIHS in the last 720 hours.    Cardiac EnzymesNo results for input(s): TROPONINI in the last 168 hours. No results for input(s): TROPIPOC in the last 168 hours.   Chemistry Recent Labs  Lab 08/12/19 1550 08/13/19 0342  NA  --  138  K  --  4.3  CL  --  106  CO2  --  25  GLUCOSE  --  91  BUN  --  19  CREATININE 1.22 1.27*  CALCIUM  --  8.6*  GFRNONAA >60 >60  GFRAA >60 >60  ANIONGAP  --  7     Hematology Recent Labs  Lab 08/12/19 1550 08/13/19 0342  WBC 4.9 4.3  RBC 4.74 4.26  HGB 15.0 13.6  HCT 44.6 39.4  MCV 94.1 92.5  MCH 31.6 31.9  MCHC 33.6 34.5  RDW 13.2 13.2  PLT 171 152    Radiology    No results found.  Cardiac Studies   Cardiac catheterization this admission, please see full report and below for summary.  2D echo pending.  Patient Profile     58 y.o. male with CAD s/p CABGx4 in 10/2017,  HTN, HLD, GERD admitted to [facility] from [facility] with chest pain similar to prior angina, and ruled in for NSTEMI. Cath 08/12/19 showed patent LIMA-LAD and SVG-OM1-Cx, ostial occluded SVG-rPDA (appears chronic), mildly elevated LVEDP, normal LVEF, and large nonocclusive thrombus in the proximal LAD, followed by occlusion of the mid LAD and 90% ostial D1 stenosis. He underwent Successful aspiration thrombectomy of the proximal LAD and PCI to ostial D1 using a Resolute Onyx 2.5 x 12 mm drug-eluting stent. There was a small amount of residual thrombus in the proximal LAD.  Assessment & Plan    1. NSTEMI/CAD - s/p PCI as above - now on Effient instead of Plavix. He will continue ASA, BB, Praluent otherwise. Feeling much better. Per cath note, continue tirofiban infusion for 24 hours (cath appears to have been done yesterday PM). Will tentatively send Effient rx to [facility] per d/w patient,  but will review DC plan with MD. (Not clear to me if he would be cleared to be dc after tirofiban this PM).  2. AIVR - question related to reperfusion overnight, happened around 11pm. K OK. Will check Mg, but otherwise continue BB.  3. Essential HTN - blood pressure well controlled, if not soft at times. No change in home acebutolol.  4. Hyperlipidemia - resume Praluent at dc. (Not on formulary.) Last LDL 10/2018 was 75. This can be re-evaluated as outpatient.  5. Probable CKD stage II - prior Cr 10/2018 1.23, post cath 1.27 so essentially stable.  For questions or updates, please contact [facility] Please consult [email or web address] for contact info under Cardiology/STEMI.  Signed, Charlie Pitter, PA-C 08/13/2019, 7:59 AM    Patient seen, examined. Available data reviewed. Agree with findings, assessment, and plan as outlined by Melina Copa, PA-C.  The patient is independently interviewed and examined.  He is awake, oriented, in no distress.  Lungs are clear, heart is regular rate and rhythm with no murmur or gallop, abdomen is soft and nontender, extremities have no edema.  Skin is warm and dry with no rash.  The patient's cardiac catheterization and echo studies are reviewed.  I discussed the findings at length with the patient and his daughter who is at the bedside.  He is chest pain-free and hemodynamically stable.  Telemetry shows no arrhythmia.  He will finish Aggrastat infusion today.  His antiplatelet therapy has been changed from clopidogrel to Effient.  I reviewed postinfarction instructions with him at length.  He will continue Praluent at discharge.  He is treated with a beta-blocker, but he is complaining of side effects with weakness after he takes his medication.  We will reduce the dose of acebutolol to 200 mg daily.  Sherren Mocha, M.D. 08/13/2019 2:05 PM

## 2019-08-13 NOTE — Discharge Summary (Addendum)
Discharge Summary    Patient ID: Scott Rosales MRN: [id number]; DOB: Apr 02, 1961  Admit date: 08/12/2019 Discharge date: 08/13/2019  Primary Care Provider: Cain Sieve, MD  Primary Cardiologist: Norman Herrlich, MD  Primary Electrophysiologist:  None   Discharge Diagnoses    Principal Problem:   Non-ST elevation (NSTEMI) myocardial infarction [facility]) Active Problems:   S/P CABG x 4   CAD (coronary artery disease), native coronary artery   Hypercholesteremia   Essential hypertension   AIVR (accelerated idioventricular rhythm) (HCC)   Junctional rhythm   Allergies Allergies  Allergen Reactions  . Nitroglycerin     Previously dropped his BP    Diagnostic Studies/Procedures    2D echo 08/13/19 IMPRESSIONS  1. Left ventricular ejection fraction, by visual estimation, is 60 to 65%. The left ventricle has normal function. There is moderately increased left ventricular hypertrophy.  2. Left ventricular diastolic Doppler parameters are consistent with impaired relaxation pattern of LV diastolic filling.  3. Global right ventricle has normal systolic function.The right ventricular size is normal. No increase in right ventricular wall thickness.  4. Left atrial size was normal.  5. Right atrial size was normal.  6. The mitral valve is abnormal. Trace mitral valve regurgitation.  7. The tricuspid valve is grossly normal. Tricuspid valve regurgitation is trivial.  8. The aortic valve is tricuspid Aortic valve regurgitation was not visualized by color flow Doppler.  9. The pulmonic valve was grossly normal. Pulmonic valve regurgitation is trivial by color flow Doppler. 10. The inferior vena cava is normal in size with greater than 50% respiratory variability, suggesting right atrial pressure of 3 mmHg.  LHC 08/12/19 Conclusions: 1. Severe three-vessel coronary artery disease, as detailed below.  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 2. Widely patent LIMA-LAD and SVG-OM1-LCx. 3. Ostially occluded SVG-rPDA; this appears chronic. 4. Normal left ventricular systolic function. 5. Mildly elevated LVEDP. 6. Successful aspiration thrombectomy of the proximal LAD and PCI to ostial D1 using a Resolute Onyx 2.5 x 12 mm drug-eluting stent.  There is a small amount of residual thrombus in the proximal LAD and 0% residual stenosis with TIMI-3 flow in D1.            Recommendations: 1. Continue tirofiban infusion for 24 hours. 2. Indefinite dual antiplatelet therapy with aspirin and prasugrel. 3. Aggressive secondary prevention, including continued alirocumab use.  Yvonne Kendall, MD [facility] Pager: [phone]     _____________   History of Present Illness     Scott Rosales is a 58 y.o. male with CAD s/p CABGx4 in 10/2017, pre-HTN, HLD, GERD admitted to [facility] from [facility] with chest pain similar to prior angina, and ruled in for NSTEMI.   He has prior CABG in 10/2017 with LIMA to LAD, SVG to OM1-2, SVG to PDA. Echo 10/2017 showed LVEF of 55-60%. Monitor 09/2018 without arrhythmias with PACs and PVCs. He had a normal perfusion study 10/2018. Scott Rosales presented to [facility] with chest pain similar to prior angina on 08/11/19. EKG showed TWI/ST depression in anterior lateral leads. He was admitted for NSTEMI so was started on heparin drip. Potassium corrected. Symptoms similar prior to CABG. His troponin had gotten up to 32. He was transferred to [facility] for plan for cardiac cath.  Hospital Course     1. NSTEMI/CAD - Cath 08/12/19 showed patent LIMA-LAD and SVG-OM1-Cx,  ostial occluded SVG-rPDA (appears chronic), mildly elevated LVEDP, normal LVEF, and large nonocclusive thrombus in the proximal LAD, followed by occlusion of the mid LAD and 90% ostial D1 stenosis. He underwent successful aspiration  thrombectomy of the proximal LAD and PCI to ostial D1 using a Resolute Onyx 2.5 x 12 mm drug-eluting stent. There was a small amount of residual thrombus in the proximal LAD. His Plavix was changed to Effient. He was continued on tirofiban infusion for 24 hours post-cath and did well. 2D echo today showed EF 60-65%, moderate LVH, impaired LV diastolic filling, no significant valvular disease. His acebutolol was decreased to 200mg  daily as he and his family reported some fatigue after taking this medicine. He also had intermittent junctional rhythm as below (but with preserved HR). Effient was sent to the [facility] pharmacy for bedside provision but acebutolol had to be sent to a local pharmacy for pickup - due to availability, [facility] [address] - patient agreeable to picking up. (I originally sent to [facility] in [place] but they did not have this in stock so I called to cancel the fill.) I also sent a 90 day supply to mail order for him. Of note, SL NTG is not on his medicine list as it previously caused him to drop his BP.  2. AIVR - brief run, question related to reperfusion overnight, happened around 11pm. No recurrence today. K and Mg were normal.  3. Pre-HTN - blood pressure well controlled, if not soft at times. Acebutolol was down-titrated as above.  4. Hyperlipidemia - resume Praluent at dc. (Not on formulary.) Last LDL 10/2018 was 75. This can be re-evaluated as outpatient.  5. Probable CKD stage II - prior Cr 10/2018 1.23, post cath 1.27 so essentially stable.  6. Junctional rhythm - pt was noted to have episodic junctional rhythm with preserved HR (60s - no change from sinus rate) and blood pressure, asymptomatic. Per Dr. 11/2018, will decrease BB as above and follow clinically.  Dr. Excell Seltzer has seen and examined the patient today and feels he is stable for discharge. He indicates he does not work so a work note was not felt necessary. TOC f/u was arranged in our [place] office. It  also appears he has a pre-existing appointment scheduled with Dr. Excell Seltzer in December which we will keep.  _____________  Discharge Vitals Blood pressure 114/71, pulse 62, temperature 98.4 F (36.9 C), temperature source Oral, resp. rate 18, weight 117.3 kg, SpO2 98 %.  Filed Weights   08/12/19 1404 08/13/19 0601  Weight: 117 kg 117.3 kg    Labs & Radiologic Studies    CBC Recent Labs    08/12/19 1550 08/13/19 0342  WBC 4.9 4.3  HGB 15.0 13.6  HCT 44.6 39.4  MCV 94.1 92.5  PLT 171 152   Basic Metabolic Panel Recent Labs    10/13/19 1550 08/13/19 0342  NA  --  138  K  --  4.3  CL  --  106  CO2  --  25  GLUCOSE  --  91  BUN  --  19  CREATININE 1.22 1.27*  CALCIUM  --  8.6*  MG  --  2.2  _____________  No results found. Disposition   Pt is being discharged home today in good condition.  Follow-up Plans & Appointments    Follow-up Information    10/13/19, NP Follow up.   Specialty: Cardiology Why: [facility] [place] location - see appointment  information below for 10/14. Luther ParodyCaitlin is one of the nurse practitioners that works closely with Dr. Dulce SellarMunley. Contact information: [address] [place] [address] [phone]          Discharge Instructions    Amb Referral to Cardiac Rehabilitation   Complete by: As directed    [facility]   Diagnosis:  Coronary Stents NSTEMI     After initial evaluation and assessments completed: Virtual Based Care may be provided alone or in conjunction with Phase 2 Cardiac Rehab based on patient barriers.: Yes   Diet - low sodium heart healthy   Complete by: As directed    Discharge instructions   Complete by: As directed    Your Effient was provided to you at bedside. You will no longer be on Plavix/clopidogrel.  Dr. Excell Seltzerooper decreased your acebutolol dose. Our hospital did not have this in our formulary so the lower dose was sent to the local outpatient [facility] [address] for your first fill of this dose. Their address is [address], [place], [place] [address], on the [address] of [address] and [address] They are open 24 hours a day. We also sent a 90-day supply to your mail order pharmacy as well. Please let our office know if you have any trouble receiving the medicine.   Increase activity slowly   Complete by: As directed    No driving for 1 week. No lifting over 10 lbs for 2 weeks. No sexual activity for 2 weeks. You may return to work in 1 week if feeling well. Keep procedure site clean & dry. If you notice increased pain, swelling, bleeding or pus, call/return!  You may shower, but no soaking baths/hot tubs/pools for 1 week.      Discharge Medications   Allergies as of 08/13/2019      Reactions   Nitroglycerin    Previously dropped his BP      Medication List    STOP taking these medications   clopidogrel 75 MG tablet Commonly known as: PLAVIX     TAKE these medications   acebutolol 200 MG capsule Commonly known as: SECTRAL Take 1 capsule (200 mg total) by mouth daily. What changed:   medication strength  how much to take   aspirin EC 81 MG tablet Take 81 mg by mouth daily.   omeprazole 20 MG capsule Commonly known as: PRILOSEC Take 20 mg by mouth daily as needed.   Praluent 75 MG/ML Soaj Generic drug: Alirocumab Inject 75 mg into the skin every 14 (fourteen) days.   prasugrel 10 MG Tabs tablet Commonly known as: EFFIENT Take 1 tablet (10 mg total) by mouth daily.   vitamin C 500 MG tablet Commonly known as: ASCORBIC ACID Take 500 mg by mouth daily.        Acute coronary syndrome (MI, NSTEMI, STEMI, etc) this admission?: Yes.     AHA/ACC Clinical Performance & Quality Measures: 4. Aspirin prescribed? - Yes 5. ADP Receptor Inhibitor (Plavix/Clopidogrel, Brilinta/Ticagrelor or Effient/Prasugrel) prescribed (includes medically managed patients)? - Yes 6. Beta Blocker prescribed? - Yes 7. High  Intensity Statin (Lipitor 40-80mg  or Crestor 20-40mg ) prescribed? - No - intolerant 8. EF assessed during THIS hospitalization? - Yes 9. For EF <40%, was ACEI/ARB prescribed? - Not Applicable (EF >/= 40%) 10. For EF <40%, Aldosterone Antagonist (Spironolactone or Eplerenone) prescribed? - Not Applicable (EF >/= 40%) 11. Cardiac Rehab Phase II ordered (Included Medically managed Patients)? -  Yes     Outstanding Labs/Studies   N/A  Duration of Discharge Encounter   Greater than 30 minutes including physician time.  Signed, Charlie Pitter, PA-C 08/13/2019, 3:58 PM

## 2019-08-13 NOTE — Progress Notes (Signed)
  Echocardiogram 2D Echocardiogram has been performed.  Scott Rosales 08/13/2019, 8:30 AM

## 2019-08-24 NOTE — Progress Notes (Signed)
Office Visit    Patient Name: Scott Rosales Date of Encounter: 08/25/2019  Primary Care Provider:  Cain Sieve, MD Primary Cardiologist:  Norman Herrlich, MD Electrophysiologist:  None   Chief Complaint    Scott Rosales is a 58 y.o. male with a hx of CAD s/p CABGx4 10/2017, pre-HTN, HLD, GERD presents today for TOC follow up after NSTEMI and subsequent PCI and stent.  Past Medical History    Past Medical History:  Diagnosis Date  . AIVR (accelerated idioventricular rhythm) (HCC)    a. reperfusion rhythm noted 08/2019 after PCI.  Marland Kitchen CAD (coronary artery disease), native coronary artery 10/15/2017   a. NSTEMI 10/2017 s/p CABGx4 with LIMA to LAD, SVG to OM1-2, SVG to PDA. b. NSTEMI 08/2019 s/p PTCA of prox LAD and PCI/DES to ostial D1, normal LVEF.  . CKD (chronic kidney disease), stage II   . GERD (gastroesophageal reflux disease) 06/14/2013  . Hyperlipidemia 10/16/2017  . Pre-hypertension 06/14/2013  . 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 CARDIAC CATHETERIZATION    . CORONARY ARTERY BYPASS GRAFT N/A 10/13/2017   Procedure: CORONARY ARTERY BYPASS GRAFTING (CABG) x 4, LIMA to LAD, SVG to OM1 and distal circ, SVG to PDA, using left internal mammary artery and right greater saphenous vein harvested endoscopically;  Surgeon: Delight Ovens, MD;  Location: [facility] OR;  Service: Open Heart Surgery;  Laterality: N/A;  . CORONARY STENT INTERVENTION N/A 08/12/2019   Procedure: CORONARY STENT INTERVENTION;  Surgeon: Yvonne Kendall, MD;  Location: [facility] INVASIVE CV LAB;  Service: Cardiovascular;  Laterality: N/A;  . INTRAVASCULAR PRESSURE WIRE/FFR STUDY N/A 10/12/2017   Procedure: INTRAVASCULAR PRESSURE WIRE/FFR STUDY;  Surgeon: Marykay Lex, MD;  Location: [facility] INVASIVE CV LAB;  Service: Cardiovascular;  Laterality: N/A;  . LEFT HEART CATH AND CORONARY ANGIOGRAPHY N/A 10/12/2017   Procedure: LEFT HEART CATH AND CORONARY ANGIOGRAPHY;   Surgeon: Marykay Lex, MD;  Location: [facility] INVASIVE CV LAB;  Service: Cardiovascular;  Laterality: N/A;  . LEFT HEART CATH AND CORS/GRAFTS ANGIOGRAPHY N/A 08/12/2019   Procedure: LEFT HEART CATH AND CORS/GRAFTS ANGIOGRAPHY;  Surgeon: Yvonne Kendall, MD;  Location: [facility] INVASIVE CV LAB;  Service: Cardiovascular;  Laterality: N/A;  . TEE WITHOUT CARDIOVERSION  10/13/2017   Procedure: TRANSESOPHAGEAL ECHOCARDIOGRAM (TEE);  Surgeon: Delight Ovens, MD;  Location: [facility] OR;  Service: Open Heart Surgery;;  . ULTRASOUND GUIDANCE FOR VASCULAR ACCESS  10/12/2017   Procedure: Ultrasound Guidance For Vascular Access;  Surgeon: Marykay Lex, MD;  Location: [facility] INVASIVE CV LAB;  Service: Cardiovascular;;    Allergies  Allergies  Allergen Reactions  . Nitroglycerin     Previously dropped his BP    History of Present Illness    Scott Rosales is a 58 y.o. male with a hx of CAD s/p CABG x4 in 10/2017 s/p DES ostial D1, preiHTN, HLD, GERD last seen while hospitalized. Admitted 08/12/19 to [facility] with NSTEMI.  Prior CABG 10/2017 with LIMA to LAD, SVG to OM1-2, SVG to PDA. Echo 10/2018 EF 55-60%. Monitor 09/2018 wihout arrthymias with PAC and PVC. Normal perfusion study 10/2018. Presented to [facility] with chest pain similar to prior angina on 08/11/19. EKG with TWI/ST depression in anterior lateral leads and admitted for NSTEMI.  Transferred to [facility] with cath 08/12/19 showing patient LIMA-LAD and SVG-OM1-Cx, ostial occluded SVG-rPDA (appears chronic), mildly elevated LVEDP, normal LVEF, and large nonocclusive thrombus in LAD followed  by  oclusion of mid LAD and 90% ostial D1 stenosis. Underwent aspiration thrombectomy of prox LAD and PCI with DES to ostial D1. Small amount residual thrombus in prox LAD. Plavix changed to Effient. Echo during admission with EF 60-65%, moderate LVh, impaired LV diastolic filling, no significant valvular disease. He had a brief run of  intermittent junctional rhythm which self resolved. Acebutolol was down titrated secondary to fatigue.   Feels well since being home. Radial cath site healing up appropriately. His wife and daughter, who is a NP, help him to manage his health. He has had no anginal symptoms since discharge - no chest pain, DOE, SOB. Reports energy level is slowly but steadily improving.   Checks his BP intermittently at home. SBP typically 127-131. We discussed his moderate LVH on echo and need to carefully  Monitor his blood pressure.   When asked about palpitations he discussed that he can feel his heart beating in the morning prior to his Acebutolol dose. Lasts a few seconds and self resolves.  Feels similar to previous PVCs. No chest pain nor SOB associated.   Has history of myalgia with statin - will add to allergy list.   EKGs/Labs/Other Studies Reviewed:   The following studies were reviewed today: Echo 08/13/19  1. Left ventricular ejection fraction, by visual estimation, is 60 to 65%. The left ventricle has normal function. There is moderately increased left ventricular hypertrophy.  2. Left ventricular diastolic Doppler parameters are consistent with impaired relaxation pattern of LV diastolic filling.  3. Global right ventricle has normal systolic function.The right ventricular size is normal. No increase in right ventricular wall thickness.  4. Left atrial size was normal.  5. Right atrial size was normal.  6. The mitral valve is abnormal. Trace mitral valve regurgitation.  7. The tricuspid valve is grossly normal. Tricuspid valve regurgitation is trivial.  8. The aortic valve is tricuspid Aortic valve regurgitation was not visualized by color flow Doppler.  9. The pulmonic valve was grossly normal. Pulmonic valve regurgitation is trivial by color flow Doppler. 10. The inferior vena cava is normal in size with greater than 50% respiratory variability, suggesting right atrial pressure of 3 mmHg.   Cath 08/12/19 Conclusions: 1. Severe three-vessel coronary artery disease, as detailed below.  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 2. Widely patent LIMA-LAD and SVG-OM1-LCx. 3. Ostially occluded SVG-rPDA; this appears chronic. 4. Normal left ventricular systolic function. 5. Mildly elevated LVEDP. 6. Successful aspiration thrombectomy of the proximal LAD and PCI to ostial D1 using a Resolute Onyx 2.5 x 12 mm drug-eluting stent.  There is a small amount of residual thrombus in the proximal LAD and 0% residual stenosis with TIMI-3 flow in D1.             Recommendations: 1. Continue tirofiban infusion for 24 hours. 2. Indefinite dual antiplatelet therapy with aspirin and prasugrel. 3. Aggressive secondary prevention, including continued alirocumab use.   EKG: No EKG today.  Recent Labs: 08/13/2019: BUN 19; Creatinine, Ser 1.27; Hemoglobin 13.6; Magnesium 2.2; Platelets 152; Potassium 4.3; Sodium 138  Recent Lipid Panel    Component Value Date/Time   CHOL 179 10/27/2018 1650   TRIG 244 (H) 10/27/2018 1650   HDL 55 10/27/2018 1650   CHOLHDL 3.3 10/27/2018 1650   CHOLHDL 4.4 10/12/2017 0139   VLDL 25 10/12/2017 0139   LDLCALC 75  10/27/2018 1650    Home Medications   Current Meds  Medication Sig  . acebutolol (SECTRAL) 200 MG capsule Take 1 capsule (200 mg total) by mouth daily.  . Alirocumab (PRALUENT) 75 MG/ML SOAJ Inject 75 mg into the skin every 14 (fourteen) days.  Marland Kitchen aspirin EC 81 MG tablet Take 81 mg by mouth daily.  Marland Kitchen omeprazole (PRILOSEC) 20 MG capsule Take 20 mg by mouth daily as needed.  . prasugrel (EFFIENT) 10 MG TABS tablet Take 1 tablet (10 mg total) by mouth daily.  . vitamin C (ASCORBIC ACID) 500 MG tablet Take 500 mg by mouth daily.  . [DISCONTINUED] acebutolol (SECTRAL) 200 MG capsule Take 1 capsule (200 mg total) by mouth daily.  .  [DISCONTINUED] prasugrel (EFFIENT) 10 MG TABS tablet Take 1 tablet (10 mg total) by mouth daily.      Review of Systems      Review of Systems  Constitution: Negative for chills, fever and malaise/fatigue.  Cardiovascular: Positive for palpitations. Negative for chest pain, dyspnea on exertion, irregular heartbeat, leg swelling and near-syncope.  Respiratory: Negative for cough, shortness of breath and wheezing.   Gastrointestinal: Negative for nausea and vomiting.  Neurological: Negative for dizziness, light-headedness and weakness.   All other systems reviewed and are otherwise negative except as noted above.  Physical Exam    VS:  BP (!) 142/86 (BP Location: Right Arm, Patient Position: Sitting, Cuff Size: Large)   Pulse (!) 58   Temp 97.7 F (36.5 C) (Temporal)   Ht  (1.778 m)   Wt 258 lb (117 kg)   SpO2 97%   BMI 37.02 kg/m  , BMI Body mass index is 37.02 kg/m. GEN: Well nourished, well developed, in no acute distress. HEENT: normal. Neck: Supple, no JVD, carotid bruits, or masses. Cardiac: RRR, no murmurs, rubs, or gallops. No clubbing, cyanosis, edema.  Radials/DP/PT 2+ and equal bilaterally.  Respiratory:  Respirations regular and unlabored, clear to auscultation bilaterally. GI: Soft, nontender, nondistended, BS + x 4. MS: No deformity or atrophy. Skin: Warm and dry, no rash. Radial cath site clean, dry, intact with no erythema, no hematoma, no signs or symptoms of infection.  Neuro:  Strength and sensation are intact. Psych: Normal affect.  Assessment & Plan    1. CAD - s/p CABG x4. S/p NSTEMI with subsequent cath 08/12/19 with aspiration thrombectomy of prox LAD and PCI with DES to ostial D1. No chest pain nor anginal symptoms since discharge.   Continue GDMT of DAPT (aspirin, Effient), beta blocker, Praluent. No PRN nitroglycerin due to side effect of marked hypotension.   He was referred to [facility]. Encouraged to participate. Tells me he plans to  return to exercise, continued.   Continue heart healthy diet.  Lengthy discussion regarding secondary prevention.   2. AIVR/Junctional rhythm - Noted while hospitalized. Self resolved and his Acebutolol dose was decreased from  daily to  daily. Continue present management.   3. PreHTN - Checks BP intermittently at home. BP was well controlled while hospitalized. Asked him to check consistently 3 times per week and keep a log. Report SBP consistently >130.   4. HLD - Has been on Praluent a number of years. Statin intolerant with myalgias. Lipid profile and direct LDL today. If LDL >70 will plan to add Zetia.  5. PVC - Known history. Reports intermittent palpitations in the morning prior to Acebutolol. Tells me he thinks his body is adjusting to the lower dosage. Continue present Acebutolol dose.  Disposition: Follow up in 2 month(s) with Dr. Dulce SellarMunley.   Alver Sorrowaitlin S Linell Shawn, NP 08/25/2019, 10:55 AM

## 2019-08-25 ENCOUNTER — Ambulatory Visit (INDEPENDENT_AMBULATORY_CARE_PROVIDER_SITE_OTHER): Payer: 59 | Admitting: Family

## 2019-08-25 ENCOUNTER — Other Ambulatory Visit: Payer: Self-pay

## 2019-08-25 ENCOUNTER — Encounter: Payer: Self-pay | Admitting: Family

## 2019-08-25 VITALS — BP 142/86 | HR 58 | Temp 97.7°F | Ht 70.0 in | Wt 258.0 lb

## 2019-08-25 DIAGNOSIS — E782 Mixed hyperlipidemia: Secondary | ICD-10-CM | POA: Diagnosis not present

## 2019-08-25 DIAGNOSIS — I493 Ventricular premature depolarization: Secondary | ICD-10-CM

## 2019-08-25 DIAGNOSIS — I25118 Atherosclerotic heart disease of native coronary artery with other forms of angina pectoris: Secondary | ICD-10-CM

## 2019-08-25 MED ORDER — ACEBUTOLOL HCL 200 MG PO CAPS: 200.0000 mg | ORAL_CAPSULE | Freq: Every day | ORAL | 1 refills | Status: DC

## 2019-08-25 MED ORDER — PRASUGREL HCL 10 MG PO TABS: 10.0000 mg | ORAL_TABLET | Freq: Every day | ORAL | 1 refills | Status: DC

## 2019-08-25 NOTE — Patient Instructions (Addendum)
Medication Instructions:  No medication changes today.   If you need a refill on your cardiac medications before your next appointment, please call your pharmacy.   Lab work: Your physician recommends that you return for lab work today: lipid profile, direct LDL, lipoprotein a  If you have labs (blood work) drawn today and your tests are completely normal, you will receive your results only by: Marland Kitchen MyChart Message (if you have MyChart) OR . A paper copy in the mail If you have any lab test that is abnormal or we need to change your treatment, we will call you to review the results.  Testing/Procedures: No testing ordered today.   Follow-Up: At [facility], you and your health needs are our priority.  As part of our continuing mission to provide you with exceptional heart care, we have created designated Provider Care Teams.  These Care Teams include your primary Cardiologist (physician) and Advanced Practice Providers (APPs -  Physician Assistants and Nurse Practitioners) who all work together to provide you with the care you need, when you need it. You will need a follow up appointment in 2 months.  You may see Shirlee More, MD or another member of our [place] Provider Team in [place]: Jenne Campus, MD . Jyl Heinz, MD  Any Other Special Instructions Will Be Listed Below (If Applicable).  When treating coronary artery disease we look for "A, B, C, D, and E's".   A - aspirin - already taking!  B - beta blocker - Acebutolol, check!  C - cholesterol - this is your Praluent and we rechecked lipids today  D - don't forget nitroglycerin - since you react poorly, we will avoid this medication   E - effient - already taking!  Recommend gradual return to regular exercise. The American Heart Association recommends 150 minutes of moderate intensity exercise weekly.Try 30 minutes of moderate intensity exercise 4-5 times per week.This could include walking, jogging, or  swimming.  If you LDL (bad cholesterol) is above 70 on labs, we will consider addition of Zetia. This is a cholesterol medication that does not affect muscles the way the 'statin' class of medications does.

## 2019-08-26 ENCOUNTER — Encounter (HOSPITAL_COMMUNITY): Payer: Self-pay | Admitting: Internal Medicine

## 2019-08-26 LAB — LIPID PANEL
Chol/HDL Ratio: 2.3 ratio (ref 0.0–5.0)
Cholesterol, Total: 142 mg/dL (ref 100–199)
HDL: 63 mg/dL (ref >39)
LDL Chol Calc (NIH): 58 mg/dL (ref 0–99)
Triglycerides: 122 mg/dL (ref 0–149)
VLDL Cholesterol Cal: 21 mg/dL (ref 5–40)

## 2019-08-26 LAB — LDL CHOLESTEROL, DIRECT: LDL Direct: 57 mg/dL (ref 0–99)

## 2019-08-26 MED ORDER — PRASUGREL HCL 10 MG PO TABS
ORAL_TABLET | ORAL | Status: DC | PRN
  Administered 2019-08-12: 30 mg via ORAL

## 2019-08-30 ENCOUNTER — Telehealth: Payer: Self-pay

## 2019-08-30 MED ORDER — EZETIMIBE 10 MG PO TABS: 10.0000 mg | ORAL_TABLET | Freq: Every day | ORAL | 2 refills | Status: DC

## 2019-08-30 NOTE — Telephone Encounter (Signed)
Patient called, informed of recent lipid panel results and that Dr. Bettina Gavia would like him to start taking Zetia 10 mg daily in addition to Praulent to achieve goal LDL cholesterol less than 55. He verbalizes understanding and prescription sent to [facility] in [place] per request.

## 2019-09-04 ENCOUNTER — Other Ambulatory Visit: Payer: Self-pay | Admitting: Physician Assistant

## 2019-09-04 DIAGNOSIS — I493 Ventricular premature depolarization: Secondary | ICD-10-CM

## 2019-09-04 DIAGNOSIS — I25118 Atherosclerotic heart disease of native coronary artery with other forms of angina pectoris: Secondary | ICD-10-CM

## 2019-09-27 ENCOUNTER — Other Ambulatory Visit: Payer: Self-pay

## 2019-09-27 MED ORDER — PRALUENT 75 MG/ML ~~LOC~~ SOAJ: 75.0000 mg | SUBCUTANEOUS | 1 refills | Status: DC

## 2019-10-20 ENCOUNTER — Other Ambulatory Visit: Payer: Self-pay

## 2019-10-20 ENCOUNTER — Encounter: Payer: Self-pay | Admitting: Cardiology

## 2019-10-20 ENCOUNTER — Ambulatory Visit (INDEPENDENT_AMBULATORY_CARE_PROVIDER_SITE_OTHER): Payer: 59 | Admitting: Cardiology

## 2019-10-20 VITALS — BP 130/86 | HR 55 | Ht 70.0 in | Wt 265.0 lb

## 2019-10-20 DIAGNOSIS — I10 Essential (primary) hypertension: Secondary | ICD-10-CM | POA: Diagnosis not present

## 2019-10-20 DIAGNOSIS — I25118 Atherosclerotic heart disease of native coronary artery with other forms of angina pectoris: Secondary | ICD-10-CM

## 2019-10-20 DIAGNOSIS — E785 Hyperlipidemia, unspecified: Secondary | ICD-10-CM

## 2019-10-20 DIAGNOSIS — I493 Ventricular premature depolarization: Secondary | ICD-10-CM

## 2019-10-20 NOTE — Progress Notes (Signed)
Cardiology Office Note:    Date:  10/20/2019   ID:  Scott Rosales, DOB May 18, 1961, MRN [id number]  PCP:  Jacklynn Barnacle, MD  Cardiologist:  Shirlee More, MD    Referring MD: Jacklynn Barnacle,*    ASSESSMENT:    1. Hyperlipidemia, unspecified hyperlipidemia type   2. PVC's (premature ventricular contractions)   3. Essential hypertension   4. Coronary artery disease of native artery of native heart with stable angina pectoris ([place])    PLAN:    In order of problems listed above:  1. Stable continue current treatment PCSK9 inhibitor Zetia check labs today 2. Stable continue beta-blocker 3. Stable continue current treatment 4. Stable continue current treatment 12 months we will transition aspirin plus clopidogrel long-term therapy.   Next appointment: 6 months   Medication Adjustments/Labs and Tests Ordered: Current medicines are reviewed at length with the patient today.  Concerns regarding medicines are outlined above.  Orders Placed This Encounter  Procedures  . Comp Met (CMET)  . Lipid Profile   No orders of the defined types were placed in this encounter.   Chief Complaint  Patient presents with  . Follow-up    History of Present Illness:    Scott Rosales is a 58 y.o. male with a hx of CAD s/p CABGx4 10/2017, pre-HTN, HLD, GERD  last seen 08/25/2019.He admitted to [facility] from [facility] with chest pain similar to prior angina, and ruled in for NSTEMI. Cath 08/12/19 showed patent LIMA-LAD and SVG-OM1-Cx, ostial occluded SVG-rPDA (appears chronic), mildly elevated LVEDP, normal LVEF, and large nonocclusive thrombus in the proximal LAD, followed by occlusion of the mid LAD and 90% ostial D1 stenosis. He underwent Successful aspiration thrombectomy of the proximal LAD and PCI to ostial D1 using a Resolute Onyx 2.5 x 12 mm drug-eluting stent. There was a small amount of residual thrombus in the proximal LAD. Compliance with diet, lifestyle and  medications:   His instruction reassuring his concerns.  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  He has done well since PCI and stent he has had no angina shortness of breath palpitations syncope he is on dual antiplatelet therapy and his lipids are ideal with Zetia and PCSK9.  I encouraged him to be more active although he is suffering at the gym is closed.  For safety we will check lipid profile CMP and I will see back in the office 6 months. Past Medical History:  Diagnosis Date  . AIVR (accelerated idioventricular rhythm) ([place])    a. reperfusion rhythm noted 08/2019 after PCI.  Marland Kitchen CAD (coronary artery disease), native coronary artery 10/15/2017   a. NSTEMI 10/2017 s/p CABGx4 with LIMA to LAD, SVG to OM1-2, SVG to PDA. b. NSTEMI 08/2019 s/p PTCA of prox LAD and PCI/DES to ostial D1, normal LVEF.  . CKD (chronic kidney disease), stage II   . GERD (gastroesophageal reflux disease) 06/14/2013  . Hyperlipidemia 10/16/2017  . Pre-hypertension 06/14/2013  . 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 CARDIAC CATHETERIZATION    . CORONARY ARTERY BYPASS GRAFT N/A 10/13/2017   Procedure: CORONARY ARTERY BYPASS GRAFTING (CABG) x 4, LIMA to LAD, SVG to OM1 and distal circ, SVG to PDA, using left internal mammary artery and right greater saphenous vein harvested endoscopically;  Surgeon: Grace Isaac, MD;  Location: [place];  Service: Open Heart Surgery;  Laterality: N/A;  . CORONARY STENT INTERVENTION N/A 08/12/2019  Procedure: CORONARY STENT INTERVENTION;  Surgeon: Nelva Bush, MD;  Location: [place] CV LAB;  Service: Cardiovascular;  Laterality: N/A;  . INTRAVASCULAR PRESSURE WIRE/FFR STUDY N/A 10/12/2017   Procedure: INTRAVASCULAR PRESSURE WIRE/FFR STUDY;  Surgeon: Leonie Man, MD;  Location: [place] CV LAB;  Service: Cardiovascular;  Laterality: N/A;  . LEFT HEART CATH AND CORONARY ANGIOGRAPHY  N/A 10/12/2017   Procedure: LEFT HEART CATH AND CORONARY ANGIOGRAPHY;  Surgeon: Leonie Man, MD;  Location: [place] CV LAB;  Service: Cardiovascular;  Laterality: N/A;  . LEFT HEART CATH AND CORS/GRAFTS ANGIOGRAPHY N/A 08/12/2019   Procedure: LEFT HEART CATH AND CORS/GRAFTS ANGIOGRAPHY;  Surgeon: Nelva Bush, MD;  Location: [place] CV LAB;  Service: Cardiovascular;  Laterality: N/A;  . TEE WITHOUT CARDIOVERSION  10/13/2017   Procedure: TRANSESOPHAGEAL ECHOCARDIOGRAM (TEE);  Surgeon: Grace Isaac, MD;  Location: [facility] OR;  Service: Open Heart Surgery;;  . ULTRASOUND GUIDANCE FOR VASCULAR ACCESS  10/12/2017   Procedure: Ultrasound Guidance For Vascular Access;  Surgeon: Leonie Man, MD;  Location: [place] CV LAB;  Service: Cardiovascular;;    Current Medications: Current Meds  Medication Sig  . acebutolol (SECTRAL) 200 MG capsule TAKE 1 CAPSULE BY MOUTH EVERY DAY  . Alirocumab (PRALUENT) 75 MG/ML SOAJ Inject 75 mg into the skin every 14 (fourteen) days.  Marland Kitchen aspirin EC 81 MG tablet Take 81 mg by mouth daily.  Marland Kitchen ezetimibe (ZETIA) 10 MG tablet Take 1 tablet (10 mg total) by mouth daily.  Marland Kitchen omeprazole (PRILOSEC) 20 MG capsule Take 20 mg by mouth daily as needed.  . prasugrel (EFFIENT) 10 MG TABS tablet Take 1 tablet (10 mg total) by mouth daily.  . vitamin C (ASCORBIC ACID) 500 MG tablet Take 500 mg by mouth daily.     Allergies:   Nitroglycerin and Statins   Social History   Socioeconomic History  . Marital status: Married    Spouse name: Not on file  . Number of children: Not on file  . Years of education: Not on file  . Highest education level: Not on file  Occupational History  . Not on file  Social Needs  . Financial resource strain: Not on file  . Food insecurity    Worry: Not on file    Inability: Not on file  . Transportation needs    Medical: Not on file    Non-medical: Not on file  Tobacco Use  . Smoking status: Never Smoker  . Smokeless  tobacco: Never Used  Substance and Sexual Activity  . Alcohol use: Never    Frequency: Never  . Drug use: Never  . Sexual activity: Not on file  Lifestyle  . Physical activity    Days per week: Not on file    Minutes per session: Not on file  . Stress: Not on file  Relationships  . Social Herbalist on phone: Not on file    Gets together: Not on file    Attends religious service: Not on file    Active member of club or organization: Not on file    Attends meetings of clubs or organizations: Not on file    Relationship status: Not on file  Other Topics Concern  . Not on file  Social History Narrative  . Not on file     Family History: The patient's family history includes Diabetes in his brother and brother; Diabetes Mellitus II in his mother; Heart attack in his father; Hypertension in  his mother; Sudden death in his brother. ROS:   Please see the history of present illness.    All other systems reviewed and are negative.  EKGs/Labs/Other Studies Reviewed:    The following studies were reviewed today:   Recent Labs: 08/13/2019: BUN 19; Creatinine, Ser 1.27; Hemoglobin 13.6; Magnesium 2.2; Platelets 152; Potassium 4.3; Sodium 138  Recent Lipid Panel    Component Value Date/Time   CHOL 142 08/25/2019 1053   TRIG 122 08/25/2019 1053   HDL 63 08/25/2019 1053   CHOLHDL 2.3 08/25/2019 1053   CHOLHDL 4.4 10/12/2017 0139   VLDL 25 10/12/2017 0139   LDLCALC 58 08/25/2019 1053   LDLDIRECT 57 08/25/2019 1053    Physical Exam:    VS:  BP 130/86 (BP Location: Right Arm)   Pulse (!) 55   Ht _0  (1.778 m)   Wt 265 lb (120.2 kg)   SpO2 96%   BMI 38.02 kg/m     Wt Readings from Last 3 Encounters:  10/20/19 265 lb (120.2 kg)  08/25/19 258 lb (117 kg)  08/13/19 258 lb 11.2 oz (117.3 kg)     GEN:  Well nourished, well developed in no acute distress HEENT: Normal NECK: No JVD; No carotid bruits LYMPHATICS: No lymphadenopathy CARDIAC: RRR, no murmurs,  rubs, gallops RESPIRATORY:  Clear to auscultation without rales, wheezing or rhonchi  ABDOMEN: Soft, non-tender, non-distended MUSCULOSKELETAL:  No edema; No deformity  SKIN: Warm and dry NEUROLOGIC:  Alert and oriented x 3 PSYCHIATRIC:  Normal affect    Signed, Shirlee More, MD  10/20/2019 2:50 PM    [facility]

## 2019-10-20 NOTE — Patient Instructions (Signed)
Medication Instructions:  Your physician recommends that you continue on your current medications as directed. Please refer to the Current Medication list given to you today.  *If you need a refill on your cardiac medications before your next appointment, please call your pharmacy*  Lab Work: Your physician recommends that you return for lab work today: lipid panel, CMP.   If you have labs (blood work) drawn today and your tests are completely normal, you will receive your results only by: Marland Kitchen MyChart Message (if you have MyChart) OR . A paper copy in the mail If you have any lab test that is abnormal or we need to change your treatment, we will call you to review the results.  Testing/Procedures: None  Follow-Up: At [facility], you and your health needs are our priority.  As part of our continuing mission to provide you with exceptional heart care, we have created designated Provider Care Teams.  These Care Teams include your primary Cardiologist (physician) and Advanced Practice Providers (APPs -  Physician Assistants and Nurse Practitioners) who all work together to provide you with the care you need, when you need it.  Your next appointment:   6 month(s)  The format for your next appointment:   In Person  Provider:   Shirlee More, MD

## 2019-10-21 LAB — COMPREHENSIVE METABOLIC PANEL
ALT: 24 IU/L (ref 0–44)
AST: 22 IU/L (ref 0–40)
Albumin/Globulin Ratio: 1.9 (ref 1.2–2.2)
Albumin: 4.3 g/dL (ref 3.8–4.9)
Alkaline Phosphatase: 77 IU/L (ref 39–117)
BUN/Creatinine Ratio: 19 (ref 9–20)
BUN: 20 mg/dL (ref 6–24)
Bilirubin Total: 0.4 mg/dL (ref 0.0–1.2)
CO2: 23 mmol/L (ref 20–29)
Calcium: 9.2 mg/dL (ref 8.7–10.2)
Chloride: 103 mmol/L (ref 96–106)
Creatinine, Ser: 1.08 mg/dL (ref 0.76–1.27)
GFR calc Af Amer: 87 mL/min/{1.73_m2} (ref >59)
GFR calc non Af Amer: 75 mL/min/{1.73_m2} (ref >59)
Globulin, Total: 2.3 g/dL (ref 1.5–4.5)
Glucose: 91 mg/dL (ref 65–99)
Potassium: 4.2 mmol/L (ref 3.5–5.2)
Sodium: 140 mmol/L (ref 134–144)
Total Protein: 6.6 g/dL (ref 6.0–8.5)

## 2019-10-21 LAB — LIPID PANEL
Chol/HDL Ratio: 2.5 ratio (ref 0.0–5.0)
Cholesterol, Total: 141 mg/dL (ref 100–199)
HDL: 56 mg/dL (ref >39)
LDL Chol Calc (NIH): 40 mg/dL (ref 0–99)
Triglycerides: 301 mg/dL — ABNORMAL HIGH (ref 0–149)
VLDL Cholesterol Cal: 45 mg/dL — ABNORMAL HIGH (ref 5–40)

## 2019-10-25 ENCOUNTER — Ambulatory Visit: Payer: Self-pay | Admitting: Cardiology

## 2019-11-02 ENCOUNTER — Telehealth: Payer: Self-pay | Admitting: *Deleted

## 2019-11-02 MED ORDER — EZETIMIBE 10 MG PO TABS: 10.0000 mg | ORAL_TABLET | Freq: Every day | ORAL | 0 refills | Status: DC

## 2019-11-02 NOTE — Telephone Encounter (Signed)
Rx refill sent to pharmacy. 

## 2019-11-10 ENCOUNTER — Other Ambulatory Visit: Payer: Self-pay

## 2019-11-10 MED ORDER — EZETIMIBE 10 MG PO TABS: 10.0000 mg | ORAL_TABLET | Freq: Every day | ORAL | 1 refills | Status: DC

## 2019-11-24 ENCOUNTER — Other Ambulatory Visit: Payer: Self-pay

## 2019-11-24 ENCOUNTER — Telehealth: Payer: Self-pay | Admitting: Cardiology

## 2019-11-24 MED ORDER — PRALUENT 75 MG/ML ~~LOC~~ SOAJ: 75.0000 mg | SUBCUTANEOUS | 1 refills | Status: DC

## 2019-11-24 NOTE — Progress Notes (Signed)
rx refilled and sent to [facility] in [facility]

## 2019-11-24 NOTE — Telephone Encounter (Signed)
Rx refilled sent to [facility] in [facility]

## 2019-11-24 NOTE — Telephone Encounter (Signed)
Needs refill praluent

## 2019-11-24 NOTE — Telephone Encounter (Signed)
Call praluient to [facility] in [place] also [facility]

## 2019-12-01 ENCOUNTER — Other Ambulatory Visit: Payer: Self-pay

## 2019-12-01 MED ORDER — PRALUENT 75 MG/ML ~~LOC~~ SOAJ: 75.0000 mg | SUBCUTANEOUS | 1 refills | Status: DC

## 2019-12-01 NOTE — Progress Notes (Signed)
[facility] sent [facility] ?

## 2019-12-01 NOTE — Telephone Encounter (Signed)
Rx sent to [facility] per patient's request

## 2019-12-01 NOTE — Telephone Encounter (Signed)
  This also needs to go to [facility]   1. Which medications need to be refilled? (please list name of each medication and dose if known) Praluent pen  2. Which pharmacy/location (including street and city if local pharmacy) is medication to be sent to?[facility]  3. Do they need a 30 day or 90 day supply?

## 2020-01-29 ENCOUNTER — Other Ambulatory Visit: Payer: Self-pay | Admitting: Family

## 2020-01-29 DIAGNOSIS — I25118 Atherosclerotic heart disease of native coronary artery with other forms of angina pectoris: Secondary | ICD-10-CM

## 2020-04-04 ENCOUNTER — Other Ambulatory Visit: Payer: Self-pay | Admitting: Cardiology

## 2020-05-03 ENCOUNTER — Other Ambulatory Visit: Payer: Self-pay | Admitting: Cardiology

## 2020-05-08 ENCOUNTER — Other Ambulatory Visit: Payer: Self-pay | Admitting: Family

## 2020-05-08 DIAGNOSIS — I493 Ventricular premature depolarization: Secondary | ICD-10-CM

## 2020-05-08 DIAGNOSIS — I25118 Atherosclerotic heart disease of native coronary artery with other forms of angina pectoris: Secondary | ICD-10-CM

## 2020-05-26 ENCOUNTER — Telehealth: Payer: Self-pay

## 2020-05-26 NOTE — Telephone Encounter (Signed)
Prior authorization started on Cover My Meds for Praluent.  Key: [id number].

## 2020-05-30 NOTE — Telephone Encounter (Signed)
Update  Prior auth approved for Praluent on 05-29-20.  Called and informed patient this had been approved and I would be calling his pharmacy and they would fill it and mail it to him.  Patient informed me he received a letter about a month ago stating they would switching his Praluent to Repatha. I explained this was more than likely due to the fact that we had not received a prior auth from his insurance company but now that we do and it should be the same medication.  Spoke with the pharmacy and they will be filling it and sending it to the patient.

## 2020-08-10 ENCOUNTER — Other Ambulatory Visit: Payer: Self-pay

## 2020-08-10 MED ORDER — PRALUENT 75 MG/ML ~~LOC~~ SOAJ: SUBCUTANEOUS | 0 refills | Status: DC

## 2020-08-10 NOTE — Telephone Encounter (Signed)
Rx refill sent to [facility] for Praluent.

## 2020-08-18 ENCOUNTER — Telehealth: Payer: Self-pay

## 2020-08-18 NOTE — Telephone Encounter (Signed)
Scott Rosales called me back and informed me that effective 07-12-20 all PA for Praluent would be terminated because now Repatha is the new medication the insurance will approve.  It can be any form of it.  Scott Rosales will fax over the paperwork for [place] to fill out if Dr. Dulce Sellar wants [place] to continue with the Repatha.

## 2020-08-18 NOTE — Telephone Encounter (Signed)
Received a fax stating patient's Praluent was not covered.  PA was completed in July and is good until 05-26-21.  Called and spoke with Corrie Dandy at [facility] who is contacting the pharmacist to find out why it is being denied when there is a PA done on it.  She will return my call later after speaking with the pharmacist.

## 2020-08-21 NOTE — Telephone Encounter (Signed)
Yes he can switch they are equivalent

## 2020-08-21 NOTE — Telephone Encounter (Signed)
Are you ok with switching this patient from Praluent to Repatha? If so we will complete this prior auth.

## 2020-08-22 ENCOUNTER — Other Ambulatory Visit: Payer: Self-pay

## 2020-08-22 ENCOUNTER — Telehealth: Payer: Self-pay

## 2020-08-22 MED ORDER — REPATHA SURECLICK 140 MG/ML ~~LOC~~ SOAJ: 1.0000 "pen " | SUBCUTANEOUS | 11 refills | Status: DC

## 2020-08-22 NOTE — Telephone Encounter (Signed)
Spoke to the patient just now and let him know that we would be switching his Praluent over to Repatha due to his insurance not covering Praluent any longer. I let the patient know that this was verified with Dr. Dulce Sellar and he was agreeable to this switch. I also let the patient know that the prior auth was submitted but we were told that this medication did not require a prior auth and he should be able to get it from his pharmacy. He verbalizes understanding and thanks me for the call back.

## 2020-09-01 ENCOUNTER — Telehealth: Payer: Self-pay | Admitting: Cardiology

## 2020-09-01 NOTE — Telephone Encounter (Signed)
STAT if HR is under 50 or over 120 (normal HR is 60-100 beats per minute)  1) What is your heart rate? unsure  2) Do you have a log of your heart rate readings (document readings)? no  3) Do you have any other symptoms? Patient's daughter, Tamala Julian, states that her fathers HR is beating hard and slow. She could not give me any readings and does not know his BP. She says that he will not tell her if he is having any symptoms so she is unsure of how he is feeling. She is trying to schedule with Dr. Dulce Sellar but I advised he did not have anything until late November in [place] or [place]. Kirstie feels like her father needs to be seen sooner since he will not disclose any information to her. Please advise.

## 2020-09-01 NOTE — Telephone Encounter (Signed)
I do not think this is good to wait till I get back to town.  I am sure Dr. Marlowe Aschoff would see him next week for me.

## 2020-09-01 NOTE — Telephone Encounter (Signed)
Spoke to the patients daughter just now and per Dr. Dulce Sellar got him scheduled to be seen by Dr. Servando Salina on Monday at 1:20 pm.    Encouraged patient to call back with any questions or concerns.

## 2020-09-04 ENCOUNTER — Other Ambulatory Visit: Payer: Self-pay

## 2020-09-04 ENCOUNTER — Encounter: Payer: Self-pay | Admitting: Cardiology

## 2020-09-04 ENCOUNTER — Ambulatory Visit (INDEPENDENT_AMBULATORY_CARE_PROVIDER_SITE_OTHER): Payer: 59 | Admitting: Cardiology

## 2020-09-04 ENCOUNTER — Ambulatory Visit (INDEPENDENT_AMBULATORY_CARE_PROVIDER_SITE_OTHER): Payer: 59

## 2020-09-04 VITALS — BP 148/88 | HR 59 | Ht 70.0 in | Wt 265.0 lb

## 2020-09-04 DIAGNOSIS — I10 Essential (primary) hypertension: Secondary | ICD-10-CM

## 2020-09-04 DIAGNOSIS — R002 Palpitations: Secondary | ICD-10-CM | POA: Diagnosis not present

## 2020-09-04 DIAGNOSIS — R42 Dizziness and giddiness: Secondary | ICD-10-CM

## 2020-09-04 DIAGNOSIS — I25118 Atherosclerotic heart disease of native coronary artery with other forms of angina pectoris: Secondary | ICD-10-CM

## 2020-09-04 DIAGNOSIS — R001 Bradycardia, unspecified: Secondary | ICD-10-CM

## 2020-09-04 DIAGNOSIS — I493 Ventricular premature depolarization: Secondary | ICD-10-CM

## 2020-09-04 DIAGNOSIS — E782 Mixed hyperlipidemia: Secondary | ICD-10-CM

## 2020-09-04 HISTORY — DX: Dizziness and giddiness: R42

## 2020-09-04 NOTE — Patient Instructions (Addendum)
Medication Instructions:  None ordered *If you need a refill on your cardiac medications before your next appointment, please call your pharmacy*   Lab Work: Your physician recommends that you have labs done in the office today. Your test included  basic metabolic panel, magnesium and TSH.  If you have labs (blood work) drawn today and your tests are completely normal, you will receive your results only by: Marland Kitchen MyChart Message (if you have MyChart) OR . A paper copy in the mail If you have any lab test that is abnormal or we need to change your treatment, we will call you to review the results.   Testing/Procedures:  WHY IS MY DOCTOR PRESCRIBING ZIO? The Zio system is proven and trusted by physicians to detect and diagnose irregular heart rhythms -- and has been prescribed to hundreds of thousands of patients.  The FDA has cleared the Zio system to monitor for many different kinds of irregular heart rhythms. In a study, physicians were able to reach a diagnosis 90% of the time with the Zio system1.  You can wear the Zio monitor -- a small, discreet, comfortable patch -- during your normal day-to-day activity, including while you sleep, shower, and exercise, while it records every single heartbeat for analysis.  1Barrett, P., et al. Comparison of 24 Hour Holter Monitoring Versus 14 Day Novel Adhesive Patch Electrocardiographic Monitoring. American Journal of Medicine, 2014.  ZIO VS. HOLTER MONITORING The Zio monitor can be comfortably worn for up to 14 days. Holter monitors can be worn for 24 to 48 hours, limiting the time to record any irregular heart rhythms you may have. Zio is able to capture data for the 51% of patients who have their first symptom-triggered arrhythmia after 48 hours.1  LIVE WITHOUT RESTRICTIONS The Zio ambulatory cardiac monitor is a small, unobtrusive, and water-resistant patch--you might even forget you're wearing it. The Zio monitor records and stores every beat of  your heart, whether you're sleeping, working out, or showering.  Wear the monitor for 3 days. Remove 09/07/20 after 2:30 pm.   Follow-Up: At [facility], you and your health needs are our priority.  As part of our continuing mission to provide you with exceptional heart care, we have created designated Provider Care Teams.  These Care Teams include your primary Cardiologist (physician) and Advanced Practice Providers (APPs -  Physician Assistants and Nurse Practitioners) who all work together to provide you with the care you need, when you need it.  We recommend signing up for the patient portal called "MyChart".  Sign up information is provided on this After Visit Summary.  MyChart is used to connect with patients for Virtual Visits (Telemedicine).  Patients are able to view lab/test results, encounter notes, upcoming appointments, etc.  Non-urgent messages can be sent to your provider as well.   To learn more about what you can do with MyChart, go to [email or web address].    Your next appointment:   3 month(s)  The format for your next appointment:   In Person  Provider:   Norman Herrlich, MD   Other Instructions NA

## 2020-09-04 NOTE — Progress Notes (Signed)
Cardiology Office Note:    Date:  09/04/2020   ID:  Scott Rosales, DOB [birth year], MRN [id number]  PCP:  Cain Sieve, MD  Cardiologist:  Norman Herrlich, MD  Electrophysiologist:  None   Referring MD: Cain Sieve,*   " I have had some lightheadedness"   History of Present Illness:    Scott Rosales is a 59 y.o. male with a hx of CAD s/p CABGx4 10/2017, hypertension, HLD, GERD  last seen 08/25/2019.He admitted to [facility] from [facility] with chest pain similar to prior angina, and ruled in for NSTEMI. Cath 08/12/19 showed patent LIMA-LAD and SVG-OM1-Cx, ostial occluded SVG-rPDA (appears chronic), mildly elevated LVEDP, normal LVEF, and large nonocclusive thrombus in the proximalLAD, followed by occlusion of the mid LAD and 90% ostial D1 stenosis. He underwentSuccessful aspiration thrombectomy of the proximal LAD and PCI to ostial D1 using a Resolute Onyx 2.5 x 12 mm drug-eluting stent. There was asmall amount of residual thrombus in the proximal LAD.  The patient follows with Dr. Dulce Sellar and was last seen by him on October 20, 2019 at which time he appeared to be stable from a cardiovascular standpoint he was on his PCSK9 inhibitors and Zetia for his hyperlipidemia and all of his medication was continued.  The patient comes in and tells me that his heart rate has been persistently in the high 50s and low 60s and recently he has had some palpitations and lightheadedness and he is concerned.  He denies any chest pain, shortness of breath any dizziness or syncope or presyncope episode.  Past Medical History:  Diagnosis Date   AIVR (accelerated idioventricular rhythm) (HCC)    a. reperfusion rhythm noted 08/2019 after PCI.   CAD (coronary artery disease), native coronary artery 10/15/2017   a. NSTEMI 10/2017 s/p CABGx4 with LIMA to LAD, SVG to OM1-2, SVG to PDA. b. NSTEMI 08/2019 s/p PTCA of prox LAD and PCI/DES to ostial D1, normal LVEF.   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    Past Surgical History:  Procedure Laterality Date   CARDIAC CATHETERIZATION     CORONARY ARTERY BYPASS GRAFT N/A 10/13/2017   Procedure: CORONARY ARTERY BYPASS GRAFTING (CABG) x 4, LIMA to LAD, SVG to OM1 and distal circ, SVG to PDA, using left internal mammary artery and right greater saphenous vein harvested endoscopically;  Surgeon: Delight Ovens, MD;  Location: [facility] OR;  Service: Open Heart Surgery;  Laterality: N/A;   CORONARY STENT INTERVENTION N/A 08/12/2019   Procedure: CORONARY STENT INTERVENTION;  Surgeon: Yvonne Kendall, MD;  Location: [facility] INVASIVE CV LAB;  Service: Cardiovascular;  Laterality: N/A;   INTRAVASCULAR PRESSURE WIRE/FFR STUDY N/A 10/12/2017   Procedure: INTRAVASCULAR PRESSURE WIRE/FFR STUDY;  Surgeon: Marykay Lex, MD;  Location: [facility] INVASIVE CV LAB;  Service: Cardiovascular;  Laterality: N/A;   LEFT HEART CATH AND CORONARY ANGIOGRAPHY N/A 10/12/2017   Procedure: LEFT HEART CATH AND CORONARY ANGIOGRAPHY;  Surgeon: Marykay Lex, MD;  Location[facility] INVASIVE CV LAB;  Service: Cardiovascular;  Laterality: N/A;   LEFT HEART CATH AND CORS/GRAFTS ANGIOGRAPHY N/A 08/12/2019   Procedure: LEFT HEART CATH AND CORS/GRAFTS ANGIOGRAPHY;  Surgeon: Yvonne Kendall, MD;  Location: [facility] INVASIVE CV LAB;  Service: Cardiovascular;  Laterality: N/A;   TEE WITHOUT CARDIOVERSION  10/13/2017   Procedure: TRANSESOPHAGEAL ECHOCARDIOGRAM (TEE);  Surgeon: Delight Ovens, MD;  Location: [facility]  OR;  Service: Open Heart Surgery;;   ULTRASOUND GUIDANCE FOR VASCULAR ACCESS  10/12/2017   Procedure: Ultrasound Guidance For Vascular Access;  Surgeon: Marykay Lex, MD;  Location: [facility] INVASIVE CV LAB;  Service: Cardiovascular;;    Current Medications: Current Meds  Medication Sig   acebutolol (SECTRAL) 200 MG capsule TAKE 1 CAPSULE  BY MOUTH  DAILY   aspirin EC 81 MG tablet Take 81 mg by mouth daily.   Evolocumab (REPATHA SURECLICK) 140 MG/ML SOAJ Inject 1 pen into the skin every 14 (fourteen) days.   ezetimibe (ZETIA) 10 MG tablet TAKE 1 TABLET BY MOUTH  DAILY   omeprazole (PRILOSEC) 20 MG capsule Take 20 mg by mouth daily as needed.   prasugrel (EFFIENT) 10 MG TABS tablet TAKE 1 TABLET BY MOUTH  DAILY   vitamin C (ASCORBIC ACID) 500 MG tablet Take 500 mg by mouth daily.     Allergies:   Nitroglycerin and Statins   Social History   Socioeconomic History   Marital status: Married    Spouse name: Not on file   Number of children: Not on file   Years of education: Not on file   Highest education level: Not on file  Occupational History   Not on file  Tobacco Use   Smoking status: Never Smoker   Smokeless tobacco: Never Used  Vaping Use   Vaping Use: Never used  Substance and Sexual Activity   Alcohol use: Never   Drug use: Never   Sexual activity: Not on file  Other Topics Concern   Not on file  Social History Narrative   Not on file   Social Determinants of Health   Financial Resource Strain:    Difficulty of Paying Living Expenses: Not on file  Food Insecurity:    Worried About Running Out of Food in the Last Year: Not on file   Ran Out of Food in the Last Year: Not on file  Transportation Needs:    Lack of Transportation (Medical): Not on file   Lack of Transportation (Non-Medical): Not on file  Physical Activity:    Days of Exercise per Week: Not on file   Minutes of Exercise per Session: Not on file  Stress:    Feeling of Stress : Not on file  Social Connections:    Frequency of Communication with Friends and Family: Not on file   Frequency of Social Gatherings with Friends and Family: Not on file   Attends Religious Services: Not on file   Active Member of Clubs or Organizations: Not on file   Attends Banker Meetings: Not on file   Marital  Status: Not on file     Family History: The patient's family history includes Diabetes in his brother and brother; Diabetes Mellitus II in his mother; Heart attack in his father; Hypertension in his mother; Sudden death in his brother.  ROS:   Review of Systems  Constitution: Reports lightheadedness.  Negative for decreased appetite, fever and weight gain.  HENT: Negative for congestion, ear discharge, hoarse voice and sore throat.   Eyes: Negative for discharge, redness, vision loss in right eye and visual halos.  Cardiovascular: Reports palpitations.  Negative for chest pain, dyspnea on exertion, leg swelling, orthopnea Respiratory: Negative for cough, hemoptysis, shortness of breath and snoring.   Endocrine: Negative for heat intolerance and polyphagia.  Hematologic/Lymphatic: Negative for bleeding problem. Does not bruise/bleed easily.  Skin: Negative for flushing, nail changes, rash and suspicious lesions.  Musculoskeletal: Negative  for arthritis, joint pain, muscle cramps, myalgias, neck pain and stiffness.  Gastrointestinal: Negative for abdominal pain, bowel incontinence, diarrhea and excessive appetite.  Genitourinary: Negative for decreased libido, genital sores and incomplete emptying.  Neurological: Negative for brief paralysis, focal weakness, headaches and loss of balance.  Psychiatric/Behavioral: Negative for altered mental status, depression and suicidal ideas.  Allergic/Immunologic: Negative for HIV exposure and persistent infections.    EKGs/Labs/Other Studies Reviewed:    The following studies were reviewed today:   EKG:  The ekg ordered today demonstrates sinus bradycardia, heart rate 59 bpm ST segment changes suggestive of anterolateral wall ischemia, compared to prior EKG in 2020 no significant change.  His echocardiogram done in October 2020IMPRESSIONS    1. Left ventricular ejection fraction, by visual estimation, is 60 to  65%. The left ventricle has  normal function. There is moderately increased  left ventricular hypertrophy.  2. Left ventricular diastolic Doppler parameters are consistent with  impaired relaxation pattern of LV diastolic filling.  3. Global right ventricle has normal systolic function.The right  ventricular size is normal. No increase in right ventricular wall  thickness.  4. Left atrial size was normal.  5. Right atrial size was normal.  6. The mitral valve is abnormal. Trace mitral valve regurgitation.  7. The tricuspid valve is grossly normal. Tricuspid valve regurgitation  is trivial.  8. The aortic valve is tricuspid Aortic valve regurgitation was not  visualized by color flow Doppler.  9. The pulmonic valve was grossly normal. Pulmonic valve regurgitation is  trivial by color flow Doppler.  10. The inferior vena cava is normal in size with greater than 50%  respiratory variability, suggesting right atrial pressure of 3 mmHg.   FINDINGS  Left Ventricle: Left ventricular ejection fraction, by visual estimation,  is 60 to 65%. The left ventricle has normal function. No evidence of left  ventricular regional wall motion abnormalities. There is moderately  increased left ventricular  hypertrophy. Spectral Doppler shows Left ventricular diastolic Doppler  parameters are consistent with impaired relaxation pattern of LV diastolic  filling. Indeterminate filling pressures.   Right Ventricle: The right ventricular size is normal. No increase in  right ventricular wall thickness. Global RV systolic function is has  normal systolic function.   Left Atrium: Left atrial size was normal in size.   Right Atrium: Right atrial size was normal in size   Pericardium: There is no evidence of pericardial effusion.   Mitral Valve: The mitral valve is abnormal. There is mild thickening of  the mitral valve leaflet(s). Trace mitral valve regurgitation.   Tricuspid Valve: The tricuspid valve is grossly normal.  Tricuspid valve  regurgitation is trivial by color flow Doppler.   Aortic Valve: The aortic valve is tricuspid. Aortic valve regurgitation  was not visualized by color flow Doppler.   Pulmonic Valve: The pulmonic valve was grossly normal. Pulmonic valve  regurgitation is trivial by color flow Doppler.   Aorta: The aortic root and ascending aorta are structurally normal, with  no evidence of dilitation.   Venous: The inferior vena cava is normal in size with greater than 50%  respiratory variability, suggesting right atrial pressure of 3 mmHg.   IAS/Shunts: No atrial level shunt detected by color flow Doppler.   Recent Labs: 10/20/2019: ALT 24; BUN 20; Creatinine, Ser 1.08; Potassium 4.2; Sodium 140  Recent Lipid Panel    Component Value Date/Time   CHOL 141 10/20/2019 1455   TRIG 301 (H) 10/20/2019 1455   HDL 56  10/20/2019 1455   CHOLHDL 2.5 10/20/2019 1455   CHOLHDL 4.4 10/12/2017 0139   VLDL 25 10/12/2017 0139   LDLCALC 40 10/20/2019 1455   LDLDIRECT 57 08/25/2019 1053    Physical Exam:    VS:  BP (!) 148/88    Pulse (!) 59    Ht  (1.778 m)    Wt 265 lb (120.2 kg)    SpO2 94%    BMI 38.02 kg/m     Wt Readings from Last 3 Encounters:  09/04/20 265 lb (120.2 kg)  10/20/19 265 lb (120.2 kg)  08/25/19 258 lb (117 kg)     GEN: Well nourished, well developed in no acute distress HEENT: Normal NECK: No JVD; No carotid bruits LYMPHATICS: No lymphadenopathy CARDIAC: S1S2 noted,RRR, no murmurs, rubs, gallops RESPIRATORY:  Clear to auscultation without rales, wheezing or rhonchi  ABDOMEN: Soft, non-tender, non-distended, +bowel sounds, no guarding. EXTREMITIES: Trace bilateral  Ankle edema, No cyanosis, no clubbing MUSCULOSKELETAL:  No deformity  SKIN: Warm and dry NEUROLOGIC:  Alert and oriented x 3, non-focal PSYCHIATRIC:  Normal affect, good insight  ASSESSMENT:    1. PVC's (premature ventricular contractions)   2. Palpitation   3. Coronary artery disease of  native artery of native heart with stable angina pectoris (HCC)   4. Primary hypertension   5. Mixed hyperlipidemia   6. Lightheadedness    PLAN:     At this time given his history of PVCs I would like to place a monitor the patient for 3 days to understand his PVC burden as well as if there is any other underlying arrhythmia which could be causing his symptoms.  In addition we will get lab work today for BMP, mag, CBC.  He does have trace ankle edema.  Of asked the patient to keep his leg elevated.  His blood pressure is slightly elevated in the office today which is an isolated reading.  We will continue to monitor issue with need to add additional antihypertensive a low-dose diuretic like hydrochlorothiazide may be appropriate.   No anginal symptoms continue patient on his current medication regimen for this.  The patient understands the need to lose weight with diet and exercise. We have discussed specific strategies for this.  The patient is in agreement with the above plan. The patient left the office in stable condition.  The patient will follow up in 3 months with Dr. Dulce Sellar.   Medication Adjustments/Labs and Tests Ordered: Current medicines are reviewed at length with the patient today.  Concerns regarding medicines are outlined above.  Orders Placed This Encounter  Procedures   Basic metabolic panel   TSH   Magnesium   LONG TERM MONITOR (3-14 DAYS)   EKG 12-Lead   No orders of the defined types were placed in this encounter.   Patient Instructions  Medication Instructions:  None ordered *If you need a refill on your cardiac medications before your next appointment, please call your pharmacy*   Lab Work: Your physician recommends that you have labs done in the office today. Your test included  basic metabolic panel, magnesium and TSH.  If you have labs (blood work) drawn today and your tests are completely normal, you will receive your results only  by:  MyChart Message (if you have MyChart) OR  A paper copy in the mail If you have any lab test that is abnormal or we need to change your treatment, we will call you to review the results.   Testing/Procedures:  WHY IS MY DOCTOR PRESCRIBING ZIO? The Zio system is proven and trusted by physicians to detect and diagnose irregular heart rhythms -- and has been prescribed to hundreds of thousands of patients.  The FDA has cleared the Zio system to monitor for many different kinds of irregular heart rhythms. In a study, physicians were able to reach a diagnosis 90% of the time with the Zio system1.  You can wear the Zio monitor -- a small, discreet, comfortable patch -- during your normal day-to-day activity, including while you sleep, shower, and exercise, while it records every single heartbeat for analysis.  1Barrett, P., et al. Comparison of 24 Hour Holter Monitoring Versus 14 Day Novel Adhesive Patch Electrocardiographic Monitoring. American Journal of Medicine, 2014.  ZIO VS. HOLTER MONITORING The Zio monitor can be comfortably worn for up to 14 days. Holter monitors can be worn for 24 to 48 hours, limiting the time to record any irregular heart rhythms you may have. Zio is able to capture data for the 51% of patients who have their first symptom-triggered arrhythmia after 48 hours.1  LIVE WITHOUT RESTRICTIONS The Zio ambulatory cardiac monitor is a small, unobtrusive, and water-resistant patch--you might even forget youre wearing it. The Zio monitor records and stores every beat of your heart, whether you're sleeping, working out, or showering.  Wear the monitor for 3 days. Remove 09/07/20 after 2:30 pm.   Follow-Up: At [facility], you and your health needs are our priority.  As part of our continuing mission to provide you with exceptional heart care, we have created designated Provider Care Teams.  These Care Teams include your primary Cardiologist (physician) and Advanced  Practice Providers (APPs -  Physician Assistants and Nurse Practitioners) who all work together to provide you with the care you need, when you need it.  We recommend signing up for the patient portal called "MyChart".  Sign up information is provided on this After Visit Summary.  MyChart is used to connect with patients for Virtual Visits (Telemedicine).  Patients are able to view lab/test results, encounter notes, upcoming appointments, etc.  Non-urgent messages can be sent to your provider as well.   To learn more about what you can do with MyChart, go to [email or web address].    Your next appointment:   3 month(s)  The format for your next appointment:   In Person  Provider:   Norman HerrlichBrian Munley, MD   Other Instructions NA     Adopting a Healthy Lifestyle.  Know what a healthy weight is for you (roughly BMI <25) and aim to maintain this   Aim for 7+ servings of fruits and vegetables daily   65-80+ fluid ounces of water or unsweet tea for healthy kidneys   Limit to max 1 drink of alcohol per day; avoid smoking/tobacco   Limit animal fats in diet for cholesterol and heart health - choose grass fed whenever available   Avoid highly processed foods, and foods high in saturated/trans fats   Aim for low stress - take time to unwind and care for your mental health   Aim for 150 min of moderate intensity exercise weekly for heart health, and weights twice weekly for bone health   Aim for 7-9 hours of sleep daily   When it comes to diets, agreement about the perfect plan isnt easy to find, even among the experts. Experts at the [employer] developed an idea known as the Healthy Eating Plate. Just imagine a plate divided into logical,  healthy portions.   The emphasis is on diet quality:   Load up on vegetables and fruits - one-half of your plate: Aim for color and variety, and remember that potatoes dont count.   Go for whole grains - one-quarter of your  plate: Whole wheat, barley, wheat berries, quinoa, oats, brown rice, and foods made with them. If you want pasta, go with whole wheat pasta.   Protein power - one-quarter of your plate: Fish, chicken, beans, and nuts are all healthy, versatile protein sources. Limit red meat.   The diet, however, does go beyond the plate, offering a few other suggestions.   Use healthy plant oils, such as olive, canola, soy, corn, sunflower and peanut. Check the labels, and avoid partially hydrogenated oil, which have unhealthy trans fats.   If youre thirsty, drink water. Coffee and tea are good in moderation, but skip sugary drinks and limit milk and dairy products to one or two daily servings.   The type of carbohydrate in the diet is more important than the amount. Some sources of carbohydrates, such as vegetables, fruits, whole grains, and beans-are healthier than others.   Finally, stay active  Signed, Thomasene Ripple, DO  09/04/2020 2:10 PM    [facility]

## 2020-09-05 ENCOUNTER — Telehealth: Payer: Self-pay

## 2020-09-05 LAB — TSH: TSH: 2.06 u[IU]/mL (ref 0.450–4.500)

## 2020-09-05 LAB — BASIC METABOLIC PANEL
BUN/Creatinine Ratio: 18 (ref 9–20)
BUN: 18 mg/dL (ref 6–24)
CO2: 23 mmol/L (ref 20–29)
Calcium: 9.5 mg/dL (ref 8.7–10.2)
Chloride: 106 mmol/L (ref 96–106)
Creatinine, Ser: 1.02 mg/dL (ref 0.76–1.27)
GFR calc Af Amer: 93 mL/min/{1.73_m2} (ref >59)
GFR calc non Af Amer: 80 mL/min/{1.73_m2} (ref >59)
Glucose: 80 mg/dL (ref 65–99)
Potassium: 4.6 mmol/L (ref 3.5–5.2)
Sodium: 142 mmol/L (ref 134–144)

## 2020-09-05 LAB — MAGNESIUM: Magnesium: 2.2 mg/dL (ref 1.6–2.3)

## 2020-09-05 NOTE — Telephone Encounter (Signed)
-----   Message from Thomasene Ripple, DO sent at 09/05/2020  2:51 PM EDT ----- Labs normal.

## 2020-09-05 NOTE — Telephone Encounter (Signed)
Left message on patients voicemail to please return our call.   

## 2020-09-06 NOTE — Telephone Encounter (Signed)
Patient returned call

## 2020-09-06 NOTE — Telephone Encounter (Signed)
Spoke with patient regarding results and recommendation.  Patient verbalizes understanding and is agreeable to plan of care. Advised patient to call back with any issues or concerns.  

## 2020-09-21 ENCOUNTER — Other Ambulatory Visit: Payer: Self-pay | Admitting: Cardiology

## 2020-09-21 DIAGNOSIS — I493 Ventricular premature depolarization: Secondary | ICD-10-CM

## 2020-09-21 DIAGNOSIS — I25118 Atherosclerotic heart disease of native coronary artery with other forms of angina pectoris: Secondary | ICD-10-CM

## 2020-09-25 ENCOUNTER — Telehealth: Payer: Self-pay

## 2020-09-25 NOTE — Telephone Encounter (Signed)
The patient has been notified of the result and verbalized understanding.  All questions (if any) were answered. Leanord Hawking, RN 09/25/2020 3:09 PM

## 2020-09-25 NOTE — Telephone Encounter (Signed)
-----   Message from Thomasene Ripple, DO sent at 09/25/2020  1:55 PM EST ----- Great news your monitor does not show any evidence of any arrhythmia.

## 2020-10-03 ENCOUNTER — Other Ambulatory Visit: Payer: Self-pay | Admitting: Cardiology

## 2020-10-03 DIAGNOSIS — I25118 Atherosclerotic heart disease of native coronary artery with other forms of angina pectoris: Secondary | ICD-10-CM

## 2020-11-28 DIAGNOSIS — N182 Chronic kidney disease, stage 2 (mild): Secondary | ICD-10-CM | POA: Insufficient documentation

## 2020-12-06 ENCOUNTER — Encounter: Payer: Self-pay | Admitting: Cardiology

## 2020-12-06 ENCOUNTER — Other Ambulatory Visit: Payer: Self-pay

## 2020-12-06 ENCOUNTER — Ambulatory Visit (INDEPENDENT_AMBULATORY_CARE_PROVIDER_SITE_OTHER): Payer: 59 | Admitting: Cardiology

## 2020-12-06 VITALS — BP 144/98 | HR 58 | Ht 71.0 in | Wt 265.0 lb

## 2020-12-06 DIAGNOSIS — Z789 Other specified health status: Secondary | ICD-10-CM

## 2020-12-06 DIAGNOSIS — I25118 Atherosclerotic heart disease of native coronary artery with other forms of angina pectoris: Secondary | ICD-10-CM | POA: Diagnosis not present

## 2020-12-06 DIAGNOSIS — I10 Essential (primary) hypertension: Secondary | ICD-10-CM

## 2020-12-06 DIAGNOSIS — E782 Mixed hyperlipidemia: Secondary | ICD-10-CM

## 2020-12-06 DIAGNOSIS — N528 Other male erectile dysfunction: Secondary | ICD-10-CM

## 2020-12-06 DIAGNOSIS — I493 Ventricular premature depolarization: Secondary | ICD-10-CM

## 2020-12-06 MED ORDER — CLOPIDOGREL BISULFATE 75 MG PO TABS: 75.0000 mg | ORAL_TABLET | Freq: Every day | ORAL | 3 refills | Status: DC

## 2020-12-06 MED ORDER — TADALAFIL 5 MG PO TABS: 5.0000 mg | ORAL_TABLET | Freq: Every day | ORAL | 0 refills | Status: DC | PRN

## 2020-12-06 NOTE — Progress Notes (Signed)
Cardiology Office Note:    Date:  12/06/2020   ID:  Scott Rosales, DOB 1961-02-20, MRN [id number]  PCP:  Scott Sieve, MD  Cardiologist:  Scott Herrlich, MD    Referring MD: Scott Rosales,*    ASSESSMENT:    1. Coronary artery disease of native artery of native heart with stable angina pectoris (HCC)   2. Primary hypertension   3. Mixed hyperlipidemia   4. Statin intolerance   5. PVC's (premature ventricular contractions)   6. Other male erectile dysfunction    PLAN:    In order of problems listed above:  1. Stable CAD greater than 1 year from PCI discontinue Effient transition to clopidogrel to keep him on dual antiplatelet therapy long-term.  Having no angina current therapy New York Heart Association class I 2. Hypertension-is above target I will ask his daughter and NP to trend blood pressure for a week remains greater than 140/90 I would add a calcium channel blocker Norvasc to his regimen. 3. He is statin intolerant discontinue Zetia and likely causing muscle symptoms we continue Repatha check lipid profile 4. Improved continue his low-dose selective beta-blocker 5. Trial of Cialis   Next appointment: 6 months   Medication Adjustments/Labs and Tests Ordered: Current medicines are reviewed at length with the patient today.  Concerns regarding medicines are outlined above.  Orders Placed This Encounter  Procedures  . Comprehensive metabolic panel  . Lipid panel  . EKG 12-Lead   Meds ordered this encounter  Medications  . clopidogrel (PLAVIX) 75 MG tablet    Sig: Take 1 tablet (75 mg total) by mouth daily.    Dispense:  90 tablet    Refill:  3  . tadalafil (CIALIS) 5 MG tablet    Sig: Take 1 tablet (5 mg total) by mouth daily as needed for erectile dysfunction.    Dispense:  10 tablet    Refill:  0    Chief Complaint  Patient presents with  . Follow-up  . Coronary Artery Disease    History of Present Illness:    Scott Rosales  is a 60 y.o. male with a hx of CAD CABG in December 2018 hypertension and hyper lipidemia last seen 09/04/2020.  Subsequent heart catheterization 08/12/2019 for non-ST elevation MI showed large nonocclusive thrombus in the proximal LAD followed by complete occlusion and 90% D1 stenosis.  He underwent successful aspiration thrombectomy of the proximal LAD and PCI and stenting to the ostial first diagonal branch.  This occurred in the setting of previous Covid.  He is subsequently used a 3-day event monitor which showed rare ventricular and supraventricular ectopy and no sustained arrhythmia.  Intolerant of PCSK9 inhibitor.    Compliance with diet, lifestyle and medications: yes  Scott Rosales is seen in the office I am pleased to hear that his daughter is found a good position and is practicing as part of [employer] in [place].  She is a Publishing rights manager. Overall he is doing well he is a strong advocate for vaccination and mitigation in his church and community.  He does not have long-haul symptoms from his COVID-19 and is fully vaccinated. His concern is sexual dysfunction I told him it is common and CAD patients he safe to use medications and after discussion of the pros and cons of Viagra versus Cialis will start Cialis 5 mg daily I expect will have a good result.  He has had no further PVCs recovery from his Covid and CAD shortness of  breath chest pain palpitation or syncope.  Overdue for labs.  He is statin intolerant and he continues to have muscle symptoms concerned the Repatha is the cause however it is extremely unlikely will work in a do is stop Zetia and if the symptoms continue I would give him a holiday and may benefit from further evaluation for primary muscle problem. Past Rosales History:  Diagnosis Date  . AIVR (accelerated idioventricular rhythm) (HCC)    a. reperfusion rhythm noted 08/2019 after PCI.  Marland Kitchen CAD (coronary artery disease), native coronary artery 10/15/2017   a. NSTEMI  10/2017 s/p CABGx4 with LIMA to LAD, SVG to OM1-2, SVG to PDA. b. NSTEMI 08/2019 s/p PTCA of prox LAD and PCI/DES to ostial D1, normal LVEF.  . CKD (chronic kidney disease), stage II   . GERD (gastroesophageal reflux disease) 06/14/2013  . Hypercholesteremia 05/19/2007  . Hyperlipidemia 10/16/2017  . Hypertension 06/14/2013  . Junctional rhythm 08/13/2019  . Lightheadedness 09/04/2020  . Non-ST elevation (NSTEMI) myocardial infarction (HCC) 10/12/2017  . Palpitation 09/09/2018  . Pre-hypertension 06/14/2013  . Prediabetes 12/26/2017  . PVC's (premature ventricular contractions) 10/27/2018  . 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 CARDIAC CATHETERIZATION    . CORONARY ARTERY BYPASS GRAFT N/A 10/13/2017   Procedure: CORONARY ARTERY BYPASS GRAFTING (CABG) x 4, LIMA to LAD, SVG to OM1 and distal circ, SVG to PDA, using left internal mammary artery and right greater saphenous vein harvested endoscopically;  Surgeon: Scott Ovens, MD;  Location: [facility] OR;  Service: Open Heart Surgery;  Laterality: N/A;  . CORONARY STENT INTERVENTION N/A 08/12/2019   Procedure: CORONARY STENT INTERVENTION;  Surgeon: Scott Kendall, MD;  Location: [facility] INVASIVE CV LAB;  Service: Cardiovascular;  Laterality: N/A;  . INTRAVASCULAR PRESSURE WIRE/FFR STUDY N/A 10/12/2017   Procedure: INTRAVASCULAR PRESSURE WIRE/FFR STUDY;  Surgeon: Scott Lex, MD;  Location: [facility] INVASIVE CV LAB;  Service: Cardiovascular;  Laterality: N/A;  . LEFT HEART CATH AND CORONARY ANGIOGRAPHY N/A 10/12/2017   Procedure: LEFT HEART CATH AND CORONARY ANGIOGRAPHY;  Surgeon: Scott Lex, MD;  Location: [facility] INVASIVE CV LAB;  Service: Cardiovascular;  Laterality: N/A;  . LEFT HEART CATH AND CORS/GRAFTS ANGIOGRAPHY N/A 08/12/2019   Procedure: LEFT HEART CATH AND CORS/GRAFTS ANGIOGRAPHY;  Surgeon: Scott Kendall, MD;  Location: [facility] INVASIVE CV LAB;  Service: Cardiovascular;  Laterality: N/A;  . TEE WITHOUT  CARDIOVERSION  10/13/2017   Procedure: TRANSESOPHAGEAL ECHOCARDIOGRAM (TEE);  Surgeon: Scott Ovens, MD;  Location: [facility] OR;  Service: Open Heart Surgery;;  . ULTRASOUND GUIDANCE FOR VASCULAR ACCESS  10/12/2017   Procedure: Ultrasound Guidance For Vascular Access;  Surgeon: Scott Lex, MD;  Location: [facility] INVASIVE CV LAB;  Service: Cardiovascular;;    Current Medications: Current Meds  Medication Sig  . acebutolol (SECTRAL) 200 MG capsule TAKE 1 CAPSULE BY MOUTH  DAILY  . aspirin EC 81 MG tablet Take 81 mg by mouth daily.  . clopidogrel (PLAVIX) 75 MG tablet Take 1 tablet (75 mg total) by mouth daily.  . Coenzyme Q10 (CO Q-10 PO) Take 1 capsule by mouth daily.  . Evolocumab (REPATHA SURECLICK) 140 MG/ML SOAJ Inject 1 pen into the skin every 14 (fourteen) days.  Marland Kitchen omeprazole (PRILOSEC) 20 MG capsule Take 20 mg by mouth daily as needed.  . tadalafil (CIALIS) 5 MG tablet Take 1 tablet (5 mg total) by mouth daily as needed for erectile dysfunction.  . vitamin C (ASCORBIC ACID) 500  MG tablet Take 500 mg by mouth daily.  . [DISCONTINUED] ezetimibe (ZETIA) 10 MG tablet TAKE 1 TABLET BY MOUTH  DAILY  . [DISCONTINUED] prasugrel (EFFIENT) 10 MG TABS tablet TAKE 1 TABLET BY MOUTH  DAILY     Allergies:   Nitroglycerin and Statins   Social History   Socioeconomic History  . Marital status: Married    Spouse name: Not on file  . Number of children: Not on file  . Years of education: Not on file  . Highest education level: Not on file  Occupational History  . Not on file  Tobacco Use  . Smoking status: Never Smoker  . Smokeless tobacco: Never Used  Vaping Use  . Vaping Use: Never used  Substance and Sexual Activity  . Alcohol use: Never  . Drug use: Never  . Sexual activity: Not on file  Other Topics Concern  . Not on file  Social History Narrative  . Not on file   Social Determinants of Health   Financial Resource Strain: Not on file  Food Insecurity: Not on file   Transportation Needs: Not on file  Physical Activity: Not on file  Stress: Not on file  Social Connections: Not on file     Family History: The patient's family history includes Diabetes in his brother and brother; Diabetes Mellitus II in his mother; Heart attack in his father; Hypertension in his mother; Sudden death in his brother. ROS:   Please see the history of present illness.    All other systems reviewed and are negative.  EKGs/Labs/Other Studies Reviewed:    The following studies were reviewed today:  EKG:  EKG ordered today and personally reviewed.  The ekg ordered today demonstrates sinus rhythm he has T wave inversion slight which is much less prominent than on his previous EKG a year ago  Recent Labs: 09/04/2020: BUN 18; Creatinine, Ser 1.02; Magnesium 2.2; Potassium 4.6; Sodium 142; TSH 2.060  Recent Lipid Panel    Component Value Date/Time   CHOL 141 10/20/2019 1455   TRIG 301 (H) 10/20/2019 1455   HDL 56 10/20/2019 1455   CHOLHDL 2.5 10/20/2019 1455   CHOLHDL 4.4 10/12/2017 0139   VLDL 25 10/12/2017 0139   LDLCALC 40 10/20/2019 1455   LDLDIRECT 57 08/25/2019 1053    Physical Exam:    VS:  BP (!) 144/98   Pulse (!) 58   Ht 5\' 11"  (1.803 m)   Wt 265 lb (120.2 kg)   SpO2 97%   BMI 36.96 kg/m     Wt Readings from Last 3 Encounters:  12/06/20 265 lb (120.2 kg)  09/04/20 265 lb (120.2 kg)  10/20/19 265 lb (120.2 kg)     GEN:  Well nourished, well developed in no acute distress HEENT: Normal NECK: No JVD; No carotid bruits LYMPHATICS: No lymphadenopathy CARDIAC: \RRR, no murmurs, rubs, gallops RESPIRATORY:  Clear to auscultation without rales, wheezing or rhonchi  ABDOMEN: Soft, non-tender, non-distended MUSCULOSKELETAL:  No edema; No deformity  SKIN: Warm and dry NEUROLOGIC:  Alert and oriented x 3 PSYCHIATRIC:  Normal affect    Signed, 14/09/20, MD  12/06/2020 11:52 AM    [facility]

## 2020-12-06 NOTE — Patient Instructions (Signed)
Medication Instructions:  Your physician has recommended you make the following change in your medication:  STOP: Zetia STOP: Prasugrel  START: Cialis 5 mg take one tablet by mouth daily as needed.  START: Plavix 75 mg take one tablet by mouth daily.  *If you need a refill on your cardiac medications before your next appointment, please call your pharmacy*   Lab Work: Your physician recommends that you return for lab work in: TODAY CMP, Lipids If you have labs (blood work) drawn today and your tests are completely normal, you will receive your results only by: Marland Kitchen MyChart Message (if you have MyChart) OR . A paper copy in the mail If you have any lab test that is abnormal or we need to change your treatment, we will call you to review the results.   Testing/Procedures: None   Follow-Up: At [facility], you and your health needs are our priority.  As part of our continuing mission to provide you with exceptional heart care, we have created designated Provider Care Teams.  These Care Teams include your primary Cardiologist (physician) and Advanced Practice Providers (APPs -  Physician Assistants and Nurse Practitioners) who all work together to provide you with the care you need, when you need it.  We recommend signing up for the patient portal called "MyChart".  Sign up information is provided on this After Visit Summary.  MyChart is used to connect with patients for Virtual Visits (Telemedicine).  Patients are able to view lab/test results, encounter notes, upcoming appointments, etc.  Non-urgent messages can be sent to your provider as well.   To learn more about what you can do with MyChart, go to [email or web address].    Your next appointment:   6 month(s)  The format for your next appointment:   In Person  Provider:   Norman Herrlich, MD   Other Instructions

## 2020-12-07 ENCOUNTER — Telehealth: Payer: Self-pay

## 2020-12-07 LAB — LIPID PANEL
Chol/HDL Ratio: 3 ratio (ref 0.0–5.0)
Cholesterol, Total: 175 mg/dL (ref 100–199)
HDL: 59 mg/dL (ref >39)
LDL Chol Calc (NIH): 88 mg/dL (ref 0–99)
Triglycerides: 166 mg/dL — ABNORMAL HIGH (ref 0–149)
VLDL Cholesterol Cal: 28 mg/dL (ref 5–40)

## 2020-12-07 LAB — COMPREHENSIVE METABOLIC PANEL
ALT: 48 IU/L — ABNORMAL HIGH (ref 0–44)
AST: 32 IU/L (ref 0–40)
Albumin/Globulin Ratio: 1.3 (ref 1.2–2.2)
Albumin: 4.1 g/dL (ref 3.8–4.9)
Alkaline Phosphatase: 75 IU/L (ref 44–121)
BUN/Creatinine Ratio: 14 (ref 10–24)
BUN: 16 mg/dL (ref 8–27)
Bilirubin Total: 0.4 mg/dL (ref 0.0–1.2)
CO2: 25 mmol/L (ref 20–29)
Calcium: 9.4 mg/dL (ref 8.6–10.2)
Chloride: 103 mmol/L (ref 96–106)
Creatinine, Ser: 1.14 mg/dL (ref 0.76–1.27)
GFR calc Af Amer: 80 mL/min/{1.73_m2} (ref >59)
GFR calc non Af Amer: 70 mL/min/{1.73_m2} (ref >59)
Globulin, Total: 3.1 g/dL (ref 1.5–4.5)
Glucose: 80 mg/dL (ref 65–99)
Potassium: 4.9 mmol/L (ref 3.5–5.2)
Sodium: 140 mmol/L (ref 134–144)
Total Protein: 7.2 g/dL (ref 6.0–8.5)

## 2020-12-07 MED ORDER — TADALAFIL 5 MG PO TABS: 5.0000 mg | ORAL_TABLET | Freq: Every day | ORAL | 0 refills | Status: DC | PRN

## 2020-12-07 NOTE — Telephone Encounter (Signed)
-----   Message from Baldo Daub, MD sent at 12/07/2020  7:57 AM EST ----- Good result no changes

## 2020-12-07 NOTE — Telephone Encounter (Signed)
Spoke with patient regarding results and recommendation.  Patient verbalizes understanding and is agreeable to plan of care. Advised patient to call back with any issues or concerns.  

## 2020-12-07 NOTE — Telephone Encounter (Signed)
Refill sent to pharmacy.   

## 2020-12-15 ENCOUNTER — Telehealth: Payer: Self-pay | Admitting: Cardiology

## 2020-12-15 MED ORDER — CLOPIDOGREL BISULFATE 75 MG PO TABS: 75.0000 mg | ORAL_TABLET | Freq: Every day | ORAL | 3 refills | Status: DC

## 2020-12-15 NOTE — Telephone Encounter (Signed)
Patient stated he needs the rx for Plavix sent [facility].

## 2020-12-15 NOTE — Telephone Encounter (Signed)
Refill sent in per request.  

## 2021-01-23 ENCOUNTER — Telehealth: Payer: Self-pay

## 2021-01-23 NOTE — Telephone Encounter (Signed)
   Primary Cardiologist: Norman Herrlich, MD  Chart reviewed as part of pre-operative protocol coverage.    Simple dental extractions are considered low risk procedures per guidelines and generally do not require any specific cardiac clearance. It is also generally accepted that for simple extractions and dental cleanings, there is no need to interrupt blood thinner therapy.  SBE prophylaxis is not required for the patient from a cardiac standpoint.  I will route this recommendation to the requesting party via Epic fax function and remove from pre-op pool.  Please call with questions.  Corine Shelter, PA-C 01/23/2021, 1:06 PM

## 2021-01-23 NOTE — Telephone Encounter (Signed)
   [facility] Pre-operative Risk Assessment    HEARTCARE STAFF: - Please ensure there is not already an duplicate clearance open for this procedure. - Under Visit Info/Reason for Call, type in Other and utilize the format Clearance MM/DD/YY or Clearance TBD. Do not use dashes or single digits. - If request is for dental extraction, please clarify the # of teeth to be extracted.  Request for surgical clearance:  1. What type of surgery is being performed? Tooth Extraction   2. When is this surgery scheduled? TBD  3. What type of clearance is required (medical clearance vs. Pharmacy clearance to hold med vs. Both)? Pharmacy  4. Are there any medications that need to be held prior to surgery and how long? Plavix and wants to know if patient needs to be on antibiotic prophylactic for the extraction    5. Practice name and name of physician performing surgery? [facility]- Dr. Alfonse Spruce   6. What is the office phone number? [phone]   7.   What is the office fax number? [phone]  8.   Anesthesia type (None, local, MAC, general) ?    Lowella Grip 01/23/2021, 11:52 AM  _________________________________________________________________   (provider comments below)

## 2021-05-21 ENCOUNTER — Other Ambulatory Visit: Payer: Self-pay

## 2021-05-22 NOTE — Progress Notes (Signed)
Cardiology Office Note:    Date:  05/23/2021   ID:  Scott Rosales, DOB [birth year], MRN [id number]  PCP:  Cain Sieve, MD  Cardiologist:  Norman Herrlich, MD    Referring MD: Cain Sieve,*    ASSESSMENT:    1. Swelling of lower extremity   2. Coronary artery disease of native artery of native heart with stable angina pectoris (HCC)   3. Primary hypertension   4. Mixed hyperlipidemia   5. Statin intolerance   6. PVC's (premature ventricular contractions)    PLAN:    In order of problems listed above:  He has swelling of the lower extremities he had taken a diuretic after his bypass surgery is not short of breath but this may represent heart failure check proBNP level if elevated needs echocardiogram and placed on a low-dose loop diuretic furosemide 3 days a week which should also help with hypertension.  Venous and arterial duplexes ordered with swelling and leg pain. Stable CAD continue medical therapy dual antiplatelet PCSK9 inhibitor Start a statin, his home blood pressure runs in the range of 140/85-90 Continue PCSK9 inhibitor recheck his lipid profile if LDL is above 100 we will need a second agent either resin binder or Zetia Continues beta-blocker also good for hypertension   Next appointment: 6 weeks   Medication Adjustments/Labs and Tests Ordered: Current medicines are reviewed at length with the patient today.  Concerns regarding medicines are outlined above.  No orders of the defined types were placed in this encounter.  No orders of the defined types were placed in this encounter.   No chief complaint on file.   History of Present Illness:    Scott Rosales is a 60 y.o. male with a hx of CAD CABG 2018 hypertension hyperlipidemia and subsequent non-ST elevation MI post COVID showing large nonocclusive thrombus in the proximal LAD followed by complete occlusion of 90% D1 stenosis.  He underwent successful aspiration thrombectomy of the  proximal LAD and PCI and stenting of the diagonal branch.  He was last seen 12/06/2020.  He is statin intolerant and is on Repatha  Compliance with diet, lifestyle and medications: Yes  He has noticed swelling of the lower extremity his legs tire easily and they ache at times he has pain but is not claudication he has no history of venous thromboembolism. He is not short of breath and does not add salt to his diet. He does not have orthopnea shortness of breath chest pain palpitation. He is compliant with his PCSK9 inhibitor but tells me he was out for short period of time.   Past Medical History:  Diagnosis Date   AIVR (accelerated idioventricular rhythm) (HCC)    a. reperfusion rhythm noted 08/2019 after PCI.   CAD (coronary artery disease), native coronary artery 10/15/2017   a. NSTEMI 10/2017 s/p CABGx4 with LIMA to LAD, SVG to OM1-2, SVG to PDA. b. NSTEMI 08/2019 s/p PTCA of prox LAD and PCI/DES to ostial D1, normal LVEF.   CKD (chronic kidney disease), stage II    GERD (gastroesophageal reflux disease) 06/14/2013   Hypercholesteremia 05/19/2007   Hyperlipidemia 10/16/2017   Hypertension 06/14/2013   Junctional rhythm 08/13/2019   Lightheadedness 09/04/2020   Non-ST elevation (NSTEMI) myocardial infarction (HCC) 10/12/2017   Palpitation 09/09/2018   Pre-hypertension 06/14/2013   Prediabetes 12/26/2017   PVC's (premature ventricular contractions) 10/27/2018   S/P CABG x 4 10/13/2017    Past Surgical History:  Procedure Laterality Date   CARDIAC CATHETERIZATION  CORONARY ARTERY BYPASS GRAFT N/A 10/13/2017   Procedure: CORONARY ARTERY BYPASS GRAFTING (CABG) x 4, LIMA to LAD, SVG to OM1 and distal circ, SVG to PDA, using left internal mammary artery and right greater saphenous vein harvested endoscopically;  Surgeon: Delight Ovens, MD;  Location: [facility] OR;  Service: Open Heart Surgery;  Laterality: N/A;   CORONARY STENT INTERVENTION N/A 08/12/2019   Procedure: CORONARY STENT  INTERVENTION;  Surgeon: Yvonne Kendall, MD;  Location: [facility] INVASIVE CV LAB;  Service: Cardiovascular;  Laterality: N/A;   INTRAVASCULAR PRESSURE WIRE/FFR STUDY N/A 10/12/2017   Procedure: INTRAVASCULAR PRESSURE WIRE/FFR STUDY;  Surgeon: Marykay Lex, MD;  Location: [facility] INVASIVE CV LAB;  Service: Cardiovascular;  Laterality: N/A;   LEFT HEART CATH AND CORONARY ANGIOGRAPHY N/A 10/12/2017   Procedure: LEFT HEART CATH AND CORONARY ANGIOGRAPHY;  Surgeon: Marykay Lex, MD;  Location: [facility] INVASIVE CV LAB;  Service: Cardiovascular;  Laterality: N/A;   LEFT HEART CATH AND CORS/GRAFTS ANGIOGRAPHY N/A 08/12/2019   Procedure: LEFT HEART CATH AND CORS/GRAFTS ANGIOGRAPHY;  Surgeon: Yvonne Kendall, MD;  Location: [facility] INVASIVE CV LAB;  Service: Cardiovascular;  Laterality: N/A;   TEE WITHOUT CARDIOVERSION  10/13/2017   Procedure: TRANSESOPHAGEAL ECHOCARDIOGRAM (TEE);  Surgeon: Delight Ovens, MD;  Location: [facility] OR;  Service: Open Heart Surgery;;   ULTRASOUND GUIDANCE FOR VASCULAR ACCESS  10/12/2017   Procedure: Ultrasound Guidance For Vascular Access;  Surgeon: Marykay Lex, MD;  Location: [facility] INVASIVE CV LAB;  Service: Cardiovascular;;    Current Medications: Current Meds  Medication Sig   acebutolol (SECTRAL) 200 MG capsule TAKE 1 CAPSULE BY MOUTH  DAILY   aspirin EC 81 MG tablet Take 81 mg by mouth daily.   clopidogrel (PLAVIX) 75 MG tablet Take 1 tablet (75 mg total) by mouth daily.   Coenzyme Q10 (CO Q-10 PO) Take 1 capsule by mouth daily.   Evolocumab (REPATHA SURECLICK) 140 MG/ML SOAJ Inject 1 pen into the skin every 14 (fourteen) days.   Multiple Vitamins-Minerals (MENS 50+ MULTI VITAMIN/MIN) TABS Take 1 tablet by mouth daily.   tadalafil (CIALIS) 5 MG tablet Take 1 tablet (5 mg total) by mouth daily as needed for erectile dysfunction.     Allergies:   Nitroglycerin and Statins   Social History   Socioeconomic History   Marital status: Married    Spouse name: Not on file   Number of  children: Not on file   Years of education: Not on file   Highest education level: Not on file  Occupational History   Not on file  Tobacco Use   Smoking status: Never   Smokeless tobacco: Never  Vaping Use   Vaping Use: Never used  Substance and Sexual Activity   Alcohol use: Never   Drug use: Never   Sexual activity: Not on file  Other Topics Concern   Not on file  Social History Narrative   Not on file   Social Determinants of Health   Financial Resource Strain: Not on file  Food Insecurity: Not on file  Transportation Needs: Not on file  Physical Activity: Not on file  Stress: Not on file  Social Connections: Not on file     Family History: The patient's family history includes Diabetes in his brother and brother; Diabetes Mellitus II in his mother; Heart attack in his father; Hypertension in his mother; Sudden death in his brother. ROS:   Please see the history of present illness.    All other systems reviewed and are  negative.  EKGs/Labs/Other Studies Reviewed:    The following studies were reviewed today:    Recent Labs: 09/04/2020: Magnesium 2.2; TSH 2.060 12/06/2020: ALT 48; BUN 16; Creatinine, Ser 1.14; Potassium 4.9; Sodium 140  Recent Lipid Panel    Component Value Date/Time   CHOL 175 12/06/2020 1158   TRIG 166 (H) 12/06/2020 1158   HDL 59 12/06/2020 1158   CHOLHDL 3.0 12/06/2020 1158   CHOLHDL 4.4 10/12/2017 0139   VLDL 25 10/12/2017 0139   LDLCALC 88 12/06/2020 1158   LDLDIRECT 57 08/25/2019 1053    Physical Exam:    VS:  BP (!) 150/93 (BP Location: Left Arm, Patient Position: Sitting, Cuff Size: Large) Comment: RE-CHECK  Pulse 60   Ht 5\' 11"  (1.803 m)   Wt 265 lb 12.8 oz (120.6 kg)   SpO2 96%   BMI 37.07 kg/m     Wt Readings from Last 3 Encounters:  05/23/21 265 lb 12.8 oz (120.6 kg)  12/06/20 265 lb (120.2 kg)  09/04/20 265 lb (120.2 kg)     GEN:  Well nourished, well developed in no acute distress HEENT: Normal NECK: No JVD;  No carotid bruits LYMPHATICS: No lymphadenopathy CARDIAC: RRR, no murmurs, rubs, gallops RESPIRATORY:  Clear to auscultation without rales, wheezing or rhonchi  ABDOMEN: Soft, non-tender, non-distended MUSCULOSKELETAL: He has 2+ lower extremity ankle to knee pitting edema; No deformity no palpable cord or calf tenderness he has a strong dorsalis pedis pulse bilaterally present on the left not present on the right PT SKIN: Warm and dry NEUROLOGIC:  Alert and oriented x 3 PSYCHIATRIC:  Normal affect    Signed, 09/06/20, MD  05/23/2021 8:41 AM    [facility]

## 2021-05-23 ENCOUNTER — Other Ambulatory Visit: Payer: Self-pay

## 2021-05-23 ENCOUNTER — Ambulatory Visit: Payer: 59 | Admitting: Cardiology

## 2021-05-23 ENCOUNTER — Encounter: Payer: Self-pay | Admitting: Cardiology

## 2021-05-23 VITALS — BP 150/93 | HR 60 | Ht 71.0 in | Wt 265.8 lb

## 2021-05-23 DIAGNOSIS — Z789 Other specified health status: Secondary | ICD-10-CM

## 2021-05-23 DIAGNOSIS — I10 Essential (primary) hypertension: Secondary | ICD-10-CM | POA: Diagnosis not present

## 2021-05-23 DIAGNOSIS — E782 Mixed hyperlipidemia: Secondary | ICD-10-CM | POA: Diagnosis not present

## 2021-05-23 DIAGNOSIS — I25118 Atherosclerotic heart disease of native coronary artery with other forms of angina pectoris: Secondary | ICD-10-CM

## 2021-05-23 DIAGNOSIS — I493 Ventricular premature depolarization: Secondary | ICD-10-CM

## 2021-05-23 DIAGNOSIS — M7989 Other specified soft tissue disorders: Secondary | ICD-10-CM

## 2021-05-23 MED ORDER — FUROSEMIDE 20 MG PO TABS: ORAL_TABLET | ORAL | 3 refills | Status: DC

## 2021-05-23 NOTE — Patient Instructions (Signed)
Medication Instructions:  Your physician has recommended you make the following change in your medication:   Start Furosemide 20 mg take on Monday, Wednesday and Friday.  *If you need a refill on your cardiac medications before your next appointment, please call your pharmacy*   Lab Work: Your physician recommends that you have labs done in the office today. Your test included  basic metabolic panel, ProBNP and lipids.  If you have labs (blood work) drawn today and your tests are completely normal, you will receive your results only by: MyChart Message (if you have MyChart) OR A paper copy in the mail If you have any lab test that is abnormal or we need to change your treatment, we will call you to review the results.   Testing/Procedures: Your physician has requested that you have an ankle brachial index (ABI). During this test an ultrasound and blood pressure cuff are used to evaluate the arteries that supply the legs with blood. Allow thirty minutes for this exam. There are no restrictions or special instructions.    Follow-Up: At [facility], you and your health needs are our priority.  As part of our continuing mission to provide you with exceptional heart care, we have created designated Provider Care Teams.  These Care Teams include your primary Cardiologist (physician) and Advanced Practice Providers (APPs -  Physician Assistants and Nurse Practitioners) who all work together to provide you with the care you need, when you need it.  We recommend signing up for the patient portal called "MyChart".  Sign up information is provided on this After Visit Summary.  MyChart is used to connect with patients for Virtual Visits (Telemedicine).  Patients are able to view lab/test results, encounter notes, upcoming appointments, etc.  Non-urgent messages can be sent to your provider as well.   To learn more about what you can do with MyChart, go to [email or web address].    Your next  appointment:   6 week(s)  The format for your next appointment:   In Person  Provider:   Norman Herrlich, MD   Other Instructions NA

## 2021-05-24 LAB — BASIC METABOLIC PANEL
BUN/Creatinine Ratio: 15 (ref 10–24)
BUN: 19 mg/dL (ref 8–27)
CO2: 24 mmol/L (ref 20–29)
Calcium: 9.2 mg/dL (ref 8.6–10.2)
Chloride: 102 mmol/L (ref 96–106)
Creatinine, Ser: 1.25 mg/dL (ref 0.76–1.27)
Glucose: 87 mg/dL (ref 65–99)
Potassium: 4.2 mmol/L (ref 3.5–5.2)
Sodium: 138 mmol/L (ref 134–144)
eGFR: 66 mL/min/{1.73_m2} (ref >59)

## 2021-05-24 LAB — LIPID PANEL
Chol/HDL Ratio: 2.4 ratio (ref 0.0–5.0)
Cholesterol, Total: 160 mg/dL (ref 100–199)
HDL: 67 mg/dL (ref >39)
LDL Chol Calc (NIH): 71 mg/dL (ref 0–99)
Triglycerides: 126 mg/dL (ref 0–149)
VLDL Cholesterol Cal: 22 mg/dL (ref 5–40)

## 2021-05-24 LAB — PRO B NATRIURETIC PEPTIDE: NT-Pro BNP: 126 pg/mL (ref 0–210)

## 2021-05-31 ENCOUNTER — Other Ambulatory Visit: Payer: Self-pay | Admitting: Cardiology

## 2021-05-31 DIAGNOSIS — I25118 Atherosclerotic heart disease of native coronary artery with other forms of angina pectoris: Secondary | ICD-10-CM

## 2021-05-31 DIAGNOSIS — I493 Ventricular premature depolarization: Secondary | ICD-10-CM

## 2021-06-01 NOTE — Telephone Encounter (Signed)
Acebutolol 200 mg @ 90 x 3 refills sent to [facility]) - [place], [place] - [phone]

## 2021-06-12 ENCOUNTER — Other Ambulatory Visit: Payer: Self-pay

## 2021-06-12 ENCOUNTER — Ambulatory Visit (INDEPENDENT_AMBULATORY_CARE_PROVIDER_SITE_OTHER): Payer: 59

## 2021-06-12 ENCOUNTER — Other Ambulatory Visit: Payer: Self-pay | Admitting: Cardiology

## 2021-06-12 ENCOUNTER — Telehealth: Payer: Self-pay

## 2021-06-12 DIAGNOSIS — M79604 Pain in right leg: Secondary | ICD-10-CM | POA: Diagnosis not present

## 2021-06-12 DIAGNOSIS — R600 Localized edema: Secondary | ICD-10-CM

## 2021-06-12 DIAGNOSIS — M7989 Other specified soft tissue disorders: Secondary | ICD-10-CM

## 2021-06-12 DIAGNOSIS — M79605 Pain in left leg: Secondary | ICD-10-CM | POA: Diagnosis not present

## 2021-06-12 NOTE — Progress Notes (Signed)
Bilateral lower extremity arterial duplex exam performed.  Jimmy Yvaine Jankowiak RDCS, RVT 

## 2021-06-12 NOTE — Telephone Encounter (Signed)
-----   Message from Baldo Daub, MD sent at 06/12/2021 11:48 AM EDT ----- Normal or stable result  Lower extremity arterial studies are normal no blockage

## 2021-06-12 NOTE — Telephone Encounter (Signed)
Spoke with patient regarding results and recommendation.  Patient verbalizes understanding and is agreeable to plan of care. Advised patient to call back with any issues or concerns.  

## 2021-07-08 NOTE — Progress Notes (Signed)
Cardiology Office Note:    Date:  07/09/2021   ID:  Scott Rosales, DOB 29-Aug-1961, MRN [id number]  PCP:  Scott Sieve, MD  Cardiologist:  Scott Herrlich, MD    Referring MD: Scott Rosales,*    ASSESSMENT:    1. Swelling of lower extremity   2. Coronary artery disease of native artery of native heart with stable angina pectoris (HCC)   3. Mixed hyperlipidemia   4. Statin intolerance   5. Primary hypertension   6. PVC's (premature ventricular contractions)    PLAN:    In order of problems listed above:  Improved although he does not have heart failure we will continue on low-dose diuretic recheck renal function potassium today. Stable CAD no anginal discomfort continue his medications aspirin clopidogrel beta-blocker and nonstatin lipid-lowering.  At this time I would not advise an ischemia evaluation Statin intolerant LDL at target continue Repatha Continue diuretic beta-blocker Continue beta-blocker not having episodes of palpitation   Next appointment: 9 months he is gone   Medication Adjustments/Labs and Tests Ordered: Current medicines are reviewed at length with the patient today.  Concerns regarding medicines are outlined above.  Orders Placed This Encounter  Procedures   Basic metabolic panel    No orders of the defined types were placed in this encounter.   Chief Complaint  Patient presents with   Follow-up   Coronary Artery Disease   Hyperlipidemia   Leg Swelling     History of Present Illness:    Scott Rosales is a 60 y.o. male with a hx of CAD with CABG 2018 and subsequent non-ST elevation MI following COVID with large nonocclusive thrombus in the proximal LAD followed by complete occlusion and 90% D1 stenosis with successful aspiration thrombectomy of the proximal LAD and PCI and stenting of the diagonal branch, hypertension hyperlipidemia and statin intolerant on Repatha last seen 05/23/2021 with lower extremity edema.  His  proBNP level was quite low at 126 and was placed on a low-dose loop diuretic to also help with control of his hypertension.  He had complaints of lower extremity pain and underwent a vascular evaluation lower extremities with ABI which was normal bilaterally along with normal TBI.  proBNP level was normal Having no shortness of breath or cough.  His daughter requests recheck of electrolytes and he is much less swelling of diuretic Lower extremity ABIs normal Past Medical History:  Diagnosis Date   AIVR (accelerated idioventricular rhythm) (HCC)    a. reperfusion rhythm noted 08/2019 after PCI.   CAD (coronary artery disease), native coronary artery 10/15/2017   a. NSTEMI 10/2017 s/p CABGx4 with LIMA to LAD, SVG to OM1-2, SVG to PDA. b. NSTEMI 08/2019 s/p PTCA of prox LAD and PCI/DES to ostial D1, normal LVEF.   CKD (chronic kidney disease), stage II    GERD (gastroesophageal reflux disease) 06/14/2013   Hypercholesteremia 05/19/2007   Hyperlipidemia 10/16/2017   Hypertension 06/14/2013   Junctional rhythm 08/13/2019   Lightheadedness 09/04/2020   Non-ST elevation (NSTEMI) myocardial infarction (HCC) 10/12/2017   Palpitation 09/09/2018   Pre-hypertension 06/14/2013   Prediabetes 12/26/2017   PVC's (premature ventricular contractions) 10/27/2018   S/P CABG x 4 10/13/2017    Past Surgical History:  Procedure Laterality Date   CARDIAC CATHETERIZATION     CORONARY ARTERY BYPASS GRAFT N/A 10/13/2017   Procedure: CORONARY ARTERY BYPASS GRAFTING (CABG) x 4, LIMA to LAD, SVG to OM1 and distal circ, SVG to PDA, using left internal mammary artery and right greater  saphenous vein harvested endoscopically;  Surgeon: Delight Ovens, MD;  Location: [facility];  Service: Open Heart Surgery;  Laterality: N/A;   CORONARY STENT INTERVENTION N/A 08/12/2019   Procedure: CORONARY STENT INTERVENTION;  Surgeon: Yvonne Kendall, MD;  Location: [facility] INVASIVE CV LAB;  Service: Cardiovascular;  Laterality: N/A;   INTRAVASCULAR  PRESSURE WIRE/FFR STUDY N/A 10/12/2017   Procedure: INTRAVASCULAR PRESSURE WIRE/FFR STUDY;  Surgeon: Marykay Lex, MD;  Location: [facility] INVASIVE CV LAB;  Service: Cardiovascular;  Laterality: N/A;   LEFT HEART CATH AND CORONARY ANGIOGRAPHY N/A 10/12/2017   Procedure: LEFT HEART CATH AND CORONARY ANGIOGRAPHY;  Surgeon: Marykay Lex, MD;  Location: [facility] INVASIVE CV LAB;  Service: Cardiovascular;  Laterality: N/A;   LEFT HEART CATH AND CORS/GRAFTS ANGIOGRAPHY N/A 08/12/2019   Procedure: LEFT HEART CATH AND CORS/GRAFTS ANGIOGRAPHY;  Surgeon: Yvonne Kendall, MD;  Location: [facility] INVASIVE CV LAB;  Service: Cardiovascular;  Laterality: N/A;   TEE WITHOUT CARDIOVERSION  10/13/2017   Procedure: TRANSESOPHAGEAL ECHOCARDIOGRAM (TEE);  Surgeon: Delight Ovens, MD;  Location: [facility] OR;  Service: Open Heart Surgery;;   ULTRASOUND GUIDANCE FOR VASCULAR ACCESS  10/12/2017   Procedure: Ultrasound Guidance For Vascular Access;  Surgeon: Marykay Lex, MD;  Location: [facility] INVASIVE CV LAB;  Service: Cardiovascular;;    Current Medications: Current Meds  Medication Sig   acebutolol (SECTRAL) 200 MG capsule TAKE 1 CAPSULE BY MOUTH  DAILY   aspirin EC 81 MG tablet Take 81 mg by mouth daily.   clopidogrel (PLAVIX) 75 MG tablet Take 1 tablet (75 mg total) by mouth daily.   Coenzyme Q10 (CO Q-10 PO) Take 1 capsule by mouth daily.   Evolocumab (REPATHA SURECLICK) 140 MG/ML SOAJ Inject 1 pen into the skin every 14 (fourteen) days.   furosemide (LASIX) 20 MG tablet Take 20 mg Lasix on Monday, Wednesday and Friday.   Multiple Vitamins-Minerals (MENS 50+ MULTI VITAMIN/MIN) TABS Take 1 tablet by mouth daily.   tadalafil (CIALIS) 5 MG tablet Take 1 tablet (5 mg total) by mouth daily as needed for erectile dysfunction.     Allergies:   Nitroglycerin and Statins   Social History   Socioeconomic History   Marital status: Married    Spouse name: Not on file   Number of children: Not on file   Years of education: Not on  file   Highest education level: Not on file  Occupational History   Not on file  Tobacco Use   Smoking status: Never   Smokeless tobacco: Never  Vaping Use   Vaping Use: Never used  Substance and Sexual Activity   Alcohol use: Never   Drug use: Never   Sexual activity: Not on file  Other Topics Concern   Not on file  Social History Narrative   Not on file   Social Determinants of Health   Financial Resource Strain: Not on file  Food Insecurity: Not on file  Transportation Needs: Not on file  Physical Activity: Not on file  Stress: Not on file  Social Connections: Not on file     Family History: The patient's family history includes Diabetes in his brother and brother; Diabetes Mellitus II in his mother; Heart attack in his father; Hypertension in his mother; Sudden death in his brother. ROS:   Please see the history of present illness.    All other systems reviewed and are negative.  EKGs/Labs/Other Studies Reviewed:    The following studies were reviewed today:    Recent Labs: 09/04/2020:  Magnesium 2.2; TSH 2.060 12/06/2020: ALT 48 05/23/2021: BUN 19; Creatinine, Ser 1.25; NT-Pro BNP 126; Potassium 4.2; Sodium 138  Recent Lipid Panel    Component Value Date/Time   CHOL 160 05/23/2021 0856   TRIG 126 05/23/2021 0856   HDL 67 05/23/2021 0856   CHOLHDL 2.4 05/23/2021 0856   CHOLHDL 4.4 10/12/2017 0139   VLDL 25 10/12/2017 0139   LDLCALC 71 05/23/2021 0856   LDLDIRECT 57 08/25/2019 1053    Physical Exam:    VS:  BP (!) 149/97 (BP Location: Left Arm, Patient Position: Sitting, Cuff Size: Large)   Pulse (!) 56   Ht 5\' 11"  (1.803 m)   Wt 267 lb 12.8 oz (121.5 kg)   SpO2 96%   BMI 37.35 kg/m     Wt Readings from Last 3 Encounters:  07/09/21 267 lb 12.8 oz (121.5 kg)  05/23/21 265 lb 12.8 oz (120.6 kg)  12/06/20 265 lb (120.2 kg)     GEN:  Well nourished, well developed in no acute distress HEENT: Normal NECK: No JVD; No carotid bruits LYMPHATICS: No  lymphadenopathy CARDIAC: RRR, no murmurs, rubs, gallops RESPIRATORY:  Clear to auscultation without rales, wheezing or rhonchi  ABDOMEN: Soft, non-tender, non-distended MUSCULOSKELETAL:  No edema; No deformity  SKIN: Warm and dry NEUROLOGIC:  Alert and oriented x 3 PSYCHIATRIC:  Normal affect    Signed, 12/08/20, MD  07/09/2021 10:05 AM     [facility]

## 2021-07-09 ENCOUNTER — Encounter: Payer: Self-pay | Admitting: Cardiology

## 2021-07-09 ENCOUNTER — Other Ambulatory Visit: Payer: Self-pay

## 2021-07-09 ENCOUNTER — Ambulatory Visit: Payer: 59 | Admitting: Cardiology

## 2021-07-09 VITALS — BP 149/97 | HR 56 | Ht 71.0 in | Wt 267.8 lb

## 2021-07-09 DIAGNOSIS — I25118 Atherosclerotic heart disease of native coronary artery with other forms of angina pectoris: Secondary | ICD-10-CM | POA: Diagnosis not present

## 2021-07-09 DIAGNOSIS — I10 Essential (primary) hypertension: Secondary | ICD-10-CM

## 2021-07-09 DIAGNOSIS — E782 Mixed hyperlipidemia: Secondary | ICD-10-CM

## 2021-07-09 DIAGNOSIS — Z789 Other specified health status: Secondary | ICD-10-CM | POA: Diagnosis not present

## 2021-07-09 DIAGNOSIS — M7989 Other specified soft tissue disorders: Secondary | ICD-10-CM

## 2021-07-09 DIAGNOSIS — I493 Ventricular premature depolarization: Secondary | ICD-10-CM

## 2021-07-09 LAB — BASIC METABOLIC PANEL
BUN/Creatinine Ratio: 15 (ref 10–24)
BUN: 18 mg/dL (ref 8–27)
CO2: 24 mmol/L (ref 20–29)
Calcium: 9.5 mg/dL (ref 8.6–10.2)
Chloride: 103 mmol/L (ref 96–106)
Creatinine, Ser: 1.18 mg/dL (ref 0.76–1.27)
Glucose: 90 mg/dL (ref 65–99)
Potassium: 4.3 mmol/L (ref 3.5–5.2)
Sodium: 139 mmol/L (ref 134–144)
eGFR: 71 mL/min/{1.73_m2} (ref >59)

## 2021-07-09 NOTE — Patient Instructions (Signed)
Medication Instructions:  Your physician recommends that you continue on your current medications as directed. Please refer to the Current Medication list given to you today.  *If you need a refill on your cardiac medications before your next appointment, please call your pharmacy*   Lab Work: Your physician recommends that you return for lab work in: TODAY BMP If you have labs (blood work) drawn today and your tests are completely normal, you will receive your results only by: MyChart Message (if you have MyChart) OR A paper copy in the mail If you have any lab test that is abnormal or we need to change your treatment, we will call you to review the results.   Testing/Procedures: None   Follow-Up: At [facility], you and your health needs are our priority.  As part of our continuing mission to provide you with exceptional heart care, we have created designated Provider Care Teams.  These Care Teams include your primary Cardiologist (physician) and Advanced Practice Providers (APPs -  Physician Assistants and Nurse Practitioners) who all work together to provide you with the care you need, when you need it.  We recommend signing up for the patient portal called "MyChart".  Sign up information is provided on this After Visit Summary.  MyChart is used to connect with patients for Virtual Visits (Telemedicine).  Patients are able to view lab/test results, encounter notes, upcoming appointments, etc.  Non-urgent messages can be sent to your provider as well.   To learn more about what you can do with MyChart, go to [email or web address].    Your next appointment:   9 month(s)  The format for your next appointment:   In Person  Provider:   Brian Munley, MD    Other Instructions   

## 2021-07-10 ENCOUNTER — Telehealth: Payer: Self-pay

## 2021-07-10 NOTE — Telephone Encounter (Signed)
-----   Message from Baldo Daub, MD sent at 07/10/2021  8:23 AM EDT ----- His daughter nurse practitioner was concerned about his sodium its normal continue his current loop diuretic

## 2021-07-10 NOTE — Telephone Encounter (Signed)
Spoke with patient regarding results and recommendation.  Patient verbalizes understanding and is agreeable to plan of care. Advised patient to call back with any issues or concerns.  

## 2021-07-23 ENCOUNTER — Other Ambulatory Visit: Payer: Self-pay | Admitting: Cardiology

## 2021-07-24 NOTE — Telephone Encounter (Signed)
Repatha Sureclick 140 mg/ml 6 ml x 3 refills sent to [facility] ([facility]) - [place], [place] - [phone]

## 2021-08-29 ENCOUNTER — Telehealth: Payer: Self-pay

## 2021-08-29 NOTE — Telephone Encounter (Signed)
PA submitted on CMM for Repatha. Key [id number]

## 2021-09-03 NOTE — Telephone Encounter (Signed)
PA denied on CMM for Repatha. Per CMM  Why was my request denied? This request was denied because you did not meet the following clinical requirements: Based on the information provided, you do not meet the established medication-specific criteria or guidelines for Repatha Sureclick at this time. The request for coverage for REPATHA SURE INJ 140MG /ML, use as directed (2 per month), is denied. This decision is based on health plan criteria for REPATHA SURE INJ 140MG /ML. This medicine is covered only if: All of the following: (1) One of the following: (A) You have been receiving at least 12 consecutive weeks of high intensity statin therapy (for example: atorvastatin 40-80mg , rosuvastatin 20-40mg ) and will continue to receive a high intensity statin at maximally tolerated dose. (B) Both of the following: (I) You are unable to tolerate high-intensity statin as evidenced by one of the following intolerable and persistent (for example: more than two weeks) symptoms: (a) Myalgia (muscle symptoms without creatine kinase elevations)

## 2021-10-17 ENCOUNTER — Other Ambulatory Visit: Payer: Self-pay | Admitting: Cardiology

## 2022-05-01 ENCOUNTER — Encounter: Payer: Self-pay | Admitting: Cardiology

## 2022-05-01 ENCOUNTER — Ambulatory Visit: Payer: 59 | Admitting: Cardiology

## 2022-05-01 VITALS — BP 153/93 | HR 55 | Ht 71.0 in | Wt 269.0 lb

## 2022-05-01 DIAGNOSIS — I493 Ventricular premature depolarization: Secondary | ICD-10-CM

## 2022-05-01 DIAGNOSIS — Z789 Other specified health status: Secondary | ICD-10-CM

## 2022-05-01 DIAGNOSIS — I25118 Atherosclerotic heart disease of native coronary artery with other forms of angina pectoris: Secondary | ICD-10-CM | POA: Diagnosis not present

## 2022-05-01 DIAGNOSIS — E782 Mixed hyperlipidemia: Secondary | ICD-10-CM | POA: Diagnosis not present

## 2022-05-01 DIAGNOSIS — I10 Essential (primary) hypertension: Secondary | ICD-10-CM

## 2022-05-01 MED ORDER — TADALAFIL 10 MG PO TABS: 10.0000 mg | ORAL_TABLET | Freq: Every day | ORAL | 3 refills | Status: DC | PRN

## 2022-05-01 NOTE — Progress Notes (Signed)
Cardiology Office Note:    Date:  05/01/2022   ID:  Scott Rosales, DOB 1961/04/19, MRN [id number] And stent PCP:  Jacklynn Barnacle, MD  Cardiologist:  Shirlee More, MD    Referring MD: Jacklynn Barnacle,*    ASSESSMENT:    1. Coronary artery disease of native artery of native heart with stable angina pectoris ([place])   2. Mixed hyperlipidemia   3. Statin intolerance   4. PVC's (premature ventricular contractions)   5. Primary hypertension    PLAN:    In order of problems listed above:  Mr. Surette continues to do well with CAD following CABG and subsequent PCI for thrombotic occlusion of his left anterior descending coronary artery as a complication of acute [id number] infection.  Continue medical therapy aspirin beta-blocker clopidogrel and lipid-lowering with Repatha statin intolerant.  Also recheck echocardiogram with his persistent abnormal T waves Check lipid profile LP(a) Continues low-dose beta-blocker Well-controlled continue ACE thiazide Cialis for erectile dysfunction  Next appointment: 1 year   Medication Adjustments/Labs and Tests Ordered: Current medicines are reviewed at length with the patient today.  Concerns regarding medicines are outlined above.  No orders of the defined types were placed in this encounter.  No orders of the defined types were placed in this encounter.   Chief Complaint  Patient presents with   Follow-up   Coronary Artery Disease   Hyperlipidemia    History of Present Illness:    Scott Rosales is a 61 y.o. male with a hx of CAD with CABG 2018 and subsequent non-ST elevation MI following COVID with large nonocclusive thrombus in the proximal LAD followed by complete occlusion and 90% D1 stenosis with successful aspiration thrombectomy of the proximal LAD and PCI and stenting of the diagonal branch, hypertension hyperlipidemia and statin intolerant on Repatha  last seen 07/09/2021.He had complaints of lower extremity  pain and underwent a vascular evaluation lower extremities with ABI which was normal bilaterally along with normal TBI.   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 reperfusion rhythm noted 08/2019 after PCI.   CAD (coronary artery disease), native coronary artery 10/15/2017   a. NSTEMI 10/2017 s/p CABGx4 with LIMA to LAD, SVG to OM1-2, SVG to PDA. b. NSTEMI 08/2019 s/p PTCA of prox LAD and PCI/DES to ostial D1, normal LVEF.   CKD (chronic kidney disease), stage II    GERD (gastroesophageal reflux disease) 06/14/2013   Hypercholesteremia 05/19/2007   Hyperlipidemia 10/16/2017   Hypertension 06/14/2013   Junctional rhythm 08/13/2019   Lightheadedness 09/04/2020   Non-ST elevation (NSTEMI) myocardial infarction ([place]) 10/12/2017   Palpitation 09/09/2018   Pre-hypertension 06/14/2013   Prediabetes 12/26/2017   PVC's (premature ventricular contractions) 10/27/2018   S/P CABG x 4 10/13/2017    Past Surgical History:  Procedure Laterality Date   CARDIAC CATHETERIZATION     CORONARY ARTERY BYPASS GRAFT N/A 10/13/2017   Procedure: CORONARY ARTERY BYPASS GRAFTING (CABG) x 4, LIMA to LAD, SVG to OM1 and distal circ, SVG to PDA, using left internal mammary artery and right greater  saphenous vein harvested endoscopically;  Surgeon: Delight Ovens, MD;  Location: [facility] OR;  Service: Open Heart Surgery;  Laterality: N/A;   CORONARY STENT INTERVENTION N/A 08/12/2019   Procedure: CORONARY STENT INTERVENTION;  Surgeon: Yvonne Kendall, MD;   Location: [facility] INVASIVE CV LAB;  Service: Cardiovascular;  Laterality: N/A;   INTRAVASCULAR PRESSURE WIRE/FFR STUDY N/A 10/12/2017   Procedure: INTRAVASCULAR PRESSURE WIRE/FFR STUDY;  Surgeon: Marykay Lex, MD;  Location: [facility] INVASIVE CV LAB;  Service: Cardiovascular;  Laterality: N/A;   LEFT HEART CATH AND CORONARY ANGIOGRAPHY N/A 10/12/2017   Procedure: LEFT HEART CATH AND CORONARY ANGIOGRAPHY;  Surgeon: Marykay Lex, MD;  Location: [facility] INVASIVE CV LAB;  Service: Cardiovascular;  Laterality: N/A;   LEFT HEART CATH AND CORS/GRAFTS ANGIOGRAPHY N/A 08/12/2019   Procedure: LEFT HEART CATH AND CORS/GRAFTS ANGIOGRAPHY;  Surgeon: Yvonne Kendall, MD;  Location: [facility] INVASIVE CV LAB;  Service: Cardiovascular;  Laterality: N/A;   TEE WITHOUT CARDIOVERSION  10/13/2017   Procedure: TRANSESOPHAGEAL ECHOCARDIOGRAM (TEE);  Surgeon: Delight Ovens, MD;  Location: [facility] OR;  Service: Open Heart Surgery;;   ULTRASOUND GUIDANCE FOR VASCULAR ACCESS  10/12/2017   Procedure: Ultrasound Guidance For Vascular Access;  Surgeon: Marykay Lex, MD;  Location: [facility] INVASIVE CV LAB;  Service: Cardiovascular;;    Current Medications: Current Meds  Medication Sig   acebutolol (SECTRAL) 200 MG capsule TAKE 1 CAPSULE BY MOUTH  DAILY   aspirin EC 81 MG tablet Take 81 mg by mouth daily.   clopidogrel (PLAVIX) 75 MG tablet TAKE 1 TABLET BY MOUTH  DAILY   Coenzyme Q10 (CO Q-10 PO) Take 1 capsule by mouth daily.   lisinopril-hydrochlorothiazide (ZESTORETIC) 20-12.5 MG tablet Take 1 tablet by mouth daily.   Multiple Vitamins-Minerals (MENS 50+ MULTI VITAMIN/MIN) TABS Take 1 tablet by mouth daily.   omeprazole (PRILOSEC) 20 MG capsule Take 20 mg by mouth daily as needed for indigestion.   REPATHA SURECLICK 140 MG/ML SOAJ INJECT 140MG  SUBCUTANEOUSLY EVERY 2 WEEKS   [DISCONTINUED] tadalafil (CIALIS) 5 MG tablet Take 1 tablet (5 mg total) by mouth daily as needed for erectile dysfunction.     Allergies:   Nitroglycerin and  Statins   Social History   Socioeconomic History   Marital status: Married    Spouse name: Not on file   Number of children: Not on file   Years of education: Not on file   Highest education level: Not on file  Occupational History   Not on file  Tobacco Use   Smoking status: Never   Smokeless tobacco: Never  Vaping Use   Vaping Use: Never used  Substance and Sexual Activity   Alcohol use: Never   Drug use: Never   Sexual activity: Not on file  Other Topics Concern   Not on file  Social History Narrative   Not on file   Social Determinants of Health   Financial Resource Strain: Not on file  Food Insecurity: Not on file  Transportation Needs: Not on file  Physical Activity: Not on file  Stress: Not on file  Social Connections: Not on file     Family History: The patient's family history includes Diabetes in his brother and brother; Diabetes Mellitus II in his mother; Heart attack in his father; Hypertension in his mother; Sudden death in his brother. ROS:   Please see the history of present illness.    All other systems reviewed and are negative.  EKGs/Labs/Other Studies Reviewed:    The following studies were  reviewed today:  EKG:  EKG ordered today and personally reviewed.  The ekg ordered today demonstrates sinus rhythm T wave inversions similar to his EKG 1 year ago chronic ischemic  Recent Labs: 05/23/2021: NT-Pro BNP 126 07/09/2021: BUN 18; Creatinine, Ser 1.18; Potassium 4.3; Sodium 139  Recent Lipid Panel    Component Value Date/Time   CHOL 160 05/23/2021 0856   TRIG 126 05/23/2021 0856   HDL 67 05/23/2021 0856   CHOLHDL 2.4 05/23/2021 0856   CHOLHDL 4.4 10/12/2017 0139   VLDL 25 10/12/2017 0139   LDLCALC 71 05/23/2021 0856   LDLDIRECT 57 08/25/2019 1053    Physical Exam:    VS:  BP (!) 153/93 (BP Location: Right Arm, Patient Position: Sitting, Cuff Size: Large)   Pulse (!) 55   Ht 5\' 11"  (1.803 m)   Wt 269 lb (122 kg)   SpO2 96%   BMI 37.52  kg/m     Wt Readings from Last 3 Encounters:  05/01/22 269 lb (122 kg)  07/09/21 267 lb 12.8 oz (121.5 kg)  05/23/21 265 lb 12.8 oz (120.6 kg)     GEN:  Well nourished, well developed in no acute distress HEENT: Normal NECK: No JVD; No carotid bruits LYMPHATICS: No lymphadenopathy CARDIAC: RRR, no murmurs, rubs, gallops RESPIRATORY:  Clear to auscultation without rales, wheezing or rhonchi  ABDOMEN: Soft, non-tender, non-distended MUSCULOSKELETAL:  No edema; No deformity  SKIN: Warm and dry NEUROLOGIC:  Alert and oriented x 3 PSYCHIATRIC:  Normal affect    Signed, 05/25/21, MD  05/01/2022 2:07 PM    [facility]

## 2022-05-01 NOTE — Patient Instructions (Signed)
Medication Instructions:  Your physician has recommended you make the following change in your medication:   START: Cialis 10 mg one daily as needed  *If you need a refill on your cardiac medications before your next appointment, please call your pharmacy*   Lab Work: Your physician recommends that you return for lab work in:   Labs today: Lipids, Lpa  If you have labs (blood work) drawn today and your tests are completely normal, you will receive your results only by: MyChart Message (if you have MyChart) OR A paper copy in the mail If you have any lab test that is abnormal or we need to change your treatment, we will call you to review the results.   Testing/Procedures: Your physician has requested that you have an echocardiogram. Echocardiography is a painless test that uses sound waves to create images of your heart. It provides your doctor with information about the size and shape of your heart and how well your heart's chambers and valves are working. This procedure takes approximately one hour. There are no restrictions for this procedure.    Follow-Up: At [facility], you and your health needs are our priority.  As part of our continuing mission to provide you with exceptional heart care, we have created designated Provider Care Teams.  These Care Teams include your primary Cardiologist (physician) and Advanced Practice Providers (APPs -  Physician Assistants and Nurse Practitioners) who all work together to provide you with the care you need, when you need it.  We recommend signing up for the patient portal called "MyChart".  Sign up information is provided on this After Visit Summary.  MyChart is used to connect with patients for Virtual Visits (Telemedicine).  Patients are able to view lab/test results, encounter notes, upcoming appointments, etc.  Non-urgent messages can be sent to your provider as well.   To learn more about what you can do with MyChart, go to  [email or web address].    Your next appointment:   1 year(s)  The format for your next appointment:   In Person  Provider:   Norman Herrlich, MD    Other Instructions None  Important Information About Sugar

## 2022-05-02 DIAGNOSIS — Z006 Encounter for examination for normal comparison and control in clinical research program: Secondary | ICD-10-CM

## 2022-05-02 LAB — LIPID PANEL
Chol/HDL Ratio: 2.9 ratio (ref 0.0–5.0)
Cholesterol, Total: 187 mg/dL (ref 100–199)
HDL: 64 mg/dL (ref >39)
LDL Chol Calc (NIH): 87 mg/dL (ref 0–99)
Triglycerides: 215 mg/dL — ABNORMAL HIGH (ref 0–149)
VLDL Cholesterol Cal: 36 mg/dL (ref 5–40)

## 2022-05-02 LAB — LIPOPROTEIN A (LPA): Lipoprotein (a): 207.7 nmol/L — ABNORMAL HIGH (ref <75.0)

## 2022-05-06 ENCOUNTER — Ambulatory Visit (INDEPENDENT_AMBULATORY_CARE_PROVIDER_SITE_OTHER): Payer: 59

## 2022-05-06 DIAGNOSIS — E782 Mixed hyperlipidemia: Secondary | ICD-10-CM

## 2022-05-06 DIAGNOSIS — I25118 Atherosclerotic heart disease of native coronary artery with other forms of angina pectoris: Secondary | ICD-10-CM | POA: Diagnosis not present

## 2022-05-06 DIAGNOSIS — Z789 Other specified health status: Secondary | ICD-10-CM

## 2022-05-06 DIAGNOSIS — I493 Ventricular premature depolarization: Secondary | ICD-10-CM | POA: Diagnosis not present

## 2022-05-06 DIAGNOSIS — I10 Essential (primary) hypertension: Secondary | ICD-10-CM

## 2022-05-06 LAB — ECHOCARDIOGRAM COMPLETE
Area-P 1/2: 4.19 cm2
S' Lateral: 3.5 cm

## 2022-05-31 DIAGNOSIS — Z006 Encounter for examination for normal comparison and control in clinical research program: Secondary | ICD-10-CM

## 2022-05-31 NOTE — Research (Signed)
Called patient to see if he was interested in [place]) trial and he wants to talk to his daughter again since he didn't realize that he could get the placebo. Told him I would call him back at a later date.

## 2022-06-28 ENCOUNTER — Other Ambulatory Visit: Payer: Self-pay

## 2022-06-28 MED ORDER — TADALAFIL 10 MG PO TABS
10.0000 mg | ORAL_TABLET | Freq: Every day | ORAL | 3 refills | Status: AC | PRN
Start: 2022-06-28 — End: ?

## 2022-07-04 ENCOUNTER — Other Ambulatory Visit: Payer: Self-pay | Admitting: Cardiology

## 2022-07-04 DIAGNOSIS — I25118 Atherosclerotic heart disease of native coronary artery with other forms of angina pectoris: Secondary | ICD-10-CM

## 2022-07-04 DIAGNOSIS — I493 Ventricular premature depolarization: Secondary | ICD-10-CM

## 2022-07-05 DIAGNOSIS — Z006 Encounter for examination for normal comparison and control in clinical research program: Secondary | ICD-10-CM

## 2022-07-05 NOTE — Research (Signed)
Follow up call for the Ocean(a) trial. Called patient to see if he was interested in participating with no answer. Unable to leave a message at this time due to mail box being full. Will follow up at a later date.

## 2022-07-10 ENCOUNTER — Other Ambulatory Visit: Payer: Self-pay | Admitting: Cardiology

## 2022-07-10 NOTE — Telephone Encounter (Signed)
Rx refill sent to pharmacy. 

## 2022-09-02 ENCOUNTER — Telehealth: Payer: Self-pay | Admitting: Cardiology

## 2022-09-02 MED ORDER — REPATHA SURECLICK 140 MG/ML ~~LOC~~ SOAJ: 140.0000 mg | SUBCUTANEOUS | 1 refills | Status: DC

## 2022-09-02 NOTE — Telephone Encounter (Signed)
Refill of Repatha 140 mg sent to to [facility].

## 2022-09-02 NOTE — Telephone Encounter (Signed)
*  STAT* If patient is at the pharmacy, call can be transferred to refill team.   1. Which medications need to be refilled? (please list name of each medication and dose if known)   REPATHA SURECLICK 101 MG/ML SOAJ    2. Which pharmacy/location (including street and city if local pharmacy) is medication to be sent to?  [facility] [place] [place] [place]  3. Do they need a 30 day or 90 day supply? [place]

## 2022-09-12 ENCOUNTER — Other Ambulatory Visit: Payer: Self-pay | Admitting: Cardiology

## 2022-09-16 ENCOUNTER — Telehealth: Payer: Self-pay | Admitting: Pharmacist

## 2022-09-16 NOTE — Telephone Encounter (Signed)
PA renewal request for Repatha. Submitted  Key: [id number]

## 2022-10-30 ENCOUNTER — Ambulatory Visit: Payer: 59 | Admitting: Cardiology

## 2022-11-19 NOTE — Telephone Encounter (Signed)
Approved through 09/17/23.

## 2023-01-13 NOTE — Progress Notes (Unsigned)
Cardiology Office Note:    Date:  01/14/2023   ID:  Scott Rosales, DOB 1961/01/26, MRN [id number]  PCP:  Jacklynn Barnacle, MD  Cardiologist:  Shirlee More, MD    Referring MD: Jacklynn Barnacle,*    ASSESSMENT:    1. Coronary artery disease of native artery of native heart with stable angina pectoris ([place])   2. Mixed hyperlipidemia   3. Statin intolerance   4. Primary hypertension   5. PVC's (premature ventricular contractions)    PLAN:    In order of problems listed above:  He has stable CAD following bypass as well as PCI and stent post COVID for coronary thrombus he is on good medical regimen including his dual antiplatelet beta-blocker and PCSK9 inhibitor.  Continue current treatment Good result recent lipid profile at target continue Repatha he is statin intolerant with myopathy Well-controlled and he will check his blood pressure daily and if he is getting numbers greater than 140 we could transition him from acebutolol to carvedilol Stable and continue beta-blocker   Next appointment: With his concerns I will see him more frequently in 3 months   Medication Adjustments/Labs and Tests Ordered: Current medicines are reviewed at length with the patient today.  Concerns regarding medicines are outlined above.  No orders of the defined types were placed in this encounter.  No orders of the defined types were placed in this encounter.  Chief complaint I now have prostate cancer and anticipate radiation therapy and hormone blocker and I wanted to talk to you regarding BP effect   History of Present Illness:    Scott Rosales is a 62 y.o. 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  His most recent lipid profile shows a cholesterol 187 LDL 87.. Compliance with diet, lifestyle and medications: Yes  He has a good attitude and strength and is ready for radiation therapy and hormone blockade His team a [facility] eventually may affect his blood pressure and he wanted to speak Overall he has done well from a cardiac perspective having no angina edema shortness of breath chest pain palpitation or syncope His blood pressure is ideal today Past Medical History:  Diagnosis Date   AIVR (accelerated idioventricular rhythm) ([place])    a. reperfusion rhythm noted 08/2019 after PCI.   CAD (coronary artery disease), native coronary artery 10/15/2017   a. NSTEMI 10/2017 s/p CABGx4 with LIMA to LAD, SVG to OM1-2, SVG to PDA. b. NSTEMI 08/2019 s/p PTCA of prox LAD and PCI/DES to ostial D1, normal LVEF.   CKD (chronic kidney disease), stage II    GERD (gastroesophageal reflux disease) 06/14/2013   Hypercholesteremia 05/19/2007   Hyperlipidemia 10/16/2017   Hypertension 06/14/2013   Junctional rhythm 08/13/2019   Lightheadedness 09/04/2020   Non-ST elevation (NSTEMI) myocardial infarction ([place]) 10/12/2017   Palpitation 09/09/2018   Pre-hypertension 06/14/2013   Prediabetes 12/26/2017   PVC's (premature ventricular contractions) 10/27/2018   S/P CABG x 4 10/13/2017    Past Surgical History:  Procedure Laterality Date   CARDIAC CATHETERIZATION  CORONARY ARTERY BYPASS GRAFT N/A 10/13/2017   Procedure: CORONARY ARTERY BYPASS GRAFTING (CABG) x 4, LIMA to LAD, SVG to OM1 and distal circ, SVG to PDA, using left internal mammary artery and right greater saphenous vein harvested endoscopically;  Surgeon: Grace Isaac, MD;  Location: [facility];   Service: Open Heart Surgery;  Laterality: N/A;   CORONARY STENT INTERVENTION N/A 08/12/2019   Procedure: CORONARY STENT INTERVENTION;  Surgeon: Nelva Bush, MD;  Location: [place] [facility];  Service: Cardiovascular;  Laterality: N/A;   INTRAVASCULAR PRESSURE WIRE/FFR STUDY N/A 10/12/2017   Procedure: INTRAVASCULAR PRESSURE WIRE/FFR STUDY;  Surgeon: Leonie Man, MD;  Location: [place] [facility];  Service: Cardiovascular;  Laterality: N/A;   LEFT HEART CATH AND CORONARY ANGIOGRAPHY N/A 10/12/2017   Procedure: LEFT HEART CATH AND CORONARY ANGIOGRAPHY;  Surgeon: Leonie Man, MD;  Location: [facility];  Service: Cardiovascular;  Laterality: N/A;   LEFT HEART CATH AND CORS/GRAFTS ANGIOGRAPHY N/A 08/12/2019   Procedure: LEFT HEART CATH AND CORS/GRAFTS ANGIOGRAPHY;  Surgeon: Nelva Bush, MD;  Location: [place];  Service: Cardiovascular;  Laterality: N/A;   TEE WITHOUT CARDIOVERSION  10/13/2017   Procedure: TRANSESOPHAGEAL ECHOCARDIOGRAM (TEE);  Surgeon: Grace Isaac, MD;  Location: [facility];  Service: Open Heart Surgery;;   ULTRASOUND GUIDANCE FOR VASCULAR ACCESS  10/12/2017   Procedure: Ultrasound Guidance For Vascular Access;  Surgeon: Leonie Man, MD;  Location: [place] [facility];  Service: Cardiovascular;;    Current Medications: Current Meds  Medication Sig   acebutolol (SECTRAL) 200 MG capsule TAKE 1 CAPSULE BY MOUTH  DAILY   aspirin EC 81 MG tablet Take 81 mg by mouth daily.   clopidogrel (PLAVIX) 75 MG tablet Take 1 tablet (75 mg total) by mouth daily.   Coenzyme Q10 (CO Q-10 PO) Take 1 capsule by mouth daily.   Evolocumab (REPATHA SURECLICK) [id number] MG/ML SOAJ Inject 140 mg into the skin every 14 (fourteen) days.   lisinopril-hydrochlorothiazide (ZESTORETIC) 20-12.5 MG tablet Take 1 tablet by mouth daily.   Multiple Vitamins-Minerals (MENS 50+ MULTI VITAMIN/MIN) TABS Take 1 tablet by mouth daily.   omeprazole (PRILOSEC) 20 MG capsule Take 20 mg by  mouth daily as needed for indigestion.   tadalafil (CIALIS) 10 MG tablet Take 1 tablet (10 mg total) by mouth daily as needed for erectile dysfunction.     Allergies:   Nitroglycerin and Statins   Social History   Socioeconomic History   Marital status: Married    Spouse name: Not on file   Number of children: Not on file   Years of education: Not on file   Highest education level: Not on file  Occupational History   Not on file  Tobacco Use   Smoking status: Never   Smokeless tobacco: Never  Vaping Use   Vaping Use: Never used  Substance and Sexual Activity   Alcohol use: Never   Drug use: Never   Sexual activity: Not on file  Other Topics Concern   Not on file  Social History Narrative   Not on file   Social Determinants of Health   Financial Resource Strain: Not on file  Food Insecurity: Not on file  Transportation Needs: Not on file  Physical Activity: Not on file  Stress: Not on file  Social Connections: Not on file     Family History: The patient's family history includes Diabetes in his brother and brother; Diabetes Mellitus II in his mother; Heart attack in  his father; Hypertension in his mother; Sudden death in his brother. ROS:   Please see the history of present illness.    All other systems reviewed and are negative.  EKGs/Labs/Other Studies Reviewed:    The following studies were reviewed today:  Cardiac Studies & Procedures   CARDIAC CATHETERIZATION  CARDIAC CATHETERIZATION 08/12/2019  Narrative Conclusions: 1. Severe three-vessel coronary artery disease, as detailed below.  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 2. Widely patent LIMA-LAD and SVG-OM1-LCx. 3. Ostially occluded SVG-rPDA; this appears chronic. 4. Normal left ventricular systolic function. 5. Mildly elevated LVEDP. 6. Successful aspiration  thrombectomy of the proximal LAD and PCI to ostial D1 using a Resolute Onyx 2.5 x 12 mm drug-eluting stent.  There is a small amount of residual thrombus in the proximal LAD and 0% residual stenosis with TIMI-3 flow in D1.  Recommendations: 1. Continue tirofiban infusion for 24 hours. 2. Indefinite dual antiplatelet therapy with aspirin and prasugrel. 3. Aggressive secondary prevention, including continued alirocumab use.  Nelva Bush, MD [facility] Pager: [phone]  Findings Coronary Findings Diagnostic  Dominance: Right  Left Main Vessel is large. 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  Left Anterior Descending The vessel has an aneurysm. Ost LAD to Prox LAD lesion is 70% stenosed. The lesion is heavily thrombotic. Prox LAD lesion is 100% stenosed. Mid LAD lesion is 50% stenosed.  First Diagonal Branch Vessel is moderate in size. Ost 1st Diag lesion is 90% stenosed.  First Septal Branch Vessel is small in size.  Second Septal Branch Vessel is small in size.  Third Diagonal Branch Vessel is small in size.  Left Circumflex Vessel is large. Mid Cx lesion is 90% stenosed. The lesion is eccentric, irregular, thrombotic and ulcerative.  Second Obtuse Marginal Branch Vessel is large in size. This is actually OM1 Ost 2nd Mrg lesion is 95% stenosed. The lesion is located at the bifurcation, eccentric, irregular, thrombotic and ulcerative.  Left Posterior Atrioventricular Artery Vessel is small in size.  Right Coronary Artery Vessel is large. There is mild diffuse disease throughout the vessel. There is mild focal disease in the vessel. The vessel is moderately tortuous.  Right Posterior Descending Artery Ost RPDA lesion is 100% stenosed. The lesion is discrete with left-to-right collateral flow. Likely subacute occlusion  Right Posterior Atrioventricular Artery Vessel is moderate in size.  First Right Posterolateral Branch Vessel is small in size.  Saphenous  Graft To Ost RPDA SVG graft was visualized by angiography. Origin lesion is 100% stenosed. The lesion is chronically occluded.  Sequential Saphenous Graft To 2nd Mrg, Ost 3rd Mrg SVG graft was visualized by angiography and is normal in caliber. The graft exhibits no disease.  LIMA LIMA Graft To Mid LAD LIMA graft was visualized by angiography and is large. The graft exhibits no disease.  Intervention  Ost LAD to Prox LAD lesion Thrombectomy Aspiration thrombectomy performed using a CATH EXTRAC PRONTO 5.62F 138CM. Post-Intervention Lesion Assessment The intervention was successful. Pre-interventional TIMI flow is 3. Post-intervention TIMI flow is 3. No complications occurred at this lesion. There is a 20% residual stenosis post intervention.  Ost 1st Diag lesion Stent A drug-eluting stent was successfully placed using a STENT RESOLUTE ONYX 2.5X12. Post-Intervention Lesion Assessment The intervention was successful. Pre-interventional TIMI flow is 2. Post-intervention TIMI flow is 3. No complications occurred at this lesion. There is a 0% residual stenosis  post intervention.   CARDIAC CATHETERIZATION  CARDIAC CATHETERIZATION 10/12/2017  Narrative Images from the original result were not included.   Ost RPDA lesion is 100% stenosed.  Bifurcation Cx-OM1 Ost 1st Mrg lesion is 95% stenosed. Mid Cx lesion is 90% stenosed.  Prox LAD lesion is 55% stenosed. Mid LAD lesion is 70% stenosed. FFR 0.79.  The left ventricular systolic function is normal. The left ventricular ejection fraction is greater than 65% by visual estimate.  LV end diastolic pressure is normal.   SEVERE 3-4 VESSEL CAD: 100% (Subacute) ostRPDA, 90% mCx @ OM1 with 95% OM1 (bifurcation 0,1,1) lesions (hazy, irregular/thrombotic), diffuse mLAD ~70% lesion (FFR 0.79).  Mild to moderate disease throughout the RCA with RPL.  Ectatic, aneurysmal left main, ostial-proximal LAD dilation -not optimal for guide  placement.  Well-preserved LVEF with mid inferior wall hypokinesis but otherwise no significant wall motion and normalities.  Moderately elevated LVEDP   PLAN OF CARE: Return to nursing unit for ongoing care.  TR band removal per protocol  The patient has three-vessel disease.  We are in the process of calling CV TS for CABG consult  I will start IV Aggrastat now and restart heparin in 8 hours given the ulcerated thrombotic  appearance of the circumflex bifurcation lesion and is ongoing pain.  We will start low-dose beta-blocker and high-dose statin  He will had to return back to his nursing unit because there are no stepdown or ICU beds available --he is currently chest pain-free     Glenetta Hew, M.D., M.S. Interventional Cardiologist  Pager # [phone] Phone # [phone] [address] [place], [place] [address]  Findings Coronary Findings Diagnostic  Dominance: Right  Left Main Vessel is large.  Left Anterior Descending Prox LAD lesion is 55% stenosed. Mid LAD lesion is 70% stenosed. Pressure wire/FFR was performed on the lesion. FFR: 0.79.  First Diagonal Branch Vessel is moderate in size.  First Septal Branch Vessel is small in size.  Second Septal Branch Vessel is small in size.  Third Diagonal Branch Vessel is small in size.  Left Circumflex Vessel is large. Mid Cx lesion is 90% stenosed. The lesion is eccentric, irregular, thrombotic and ulcerative.  Second Obtuse Marginal Branch Vessel is large in size. This is actually OM1 Ost 2nd Mrg lesion is 95% stenosed. The lesion is located at the bifurcation, eccentric, irregular, thrombotic and ulcerative.  Left Posterior Atrioventricular Artery Vessel is small in size.  Right Coronary Artery Vessel is large. There is mild diffuse disease throughout the vessel. There is mild focal disease in the vessel. The vessel is moderately tortuous.  Right Posterior Descending Artery Ost RPDA  lesion is 100% stenosed. The lesion is discrete with left-to-right collateral flow. Likely subacute occlusion  Right Posterior Atrioventricular Artery Vessel is moderate in size.  First Right Posterolateral Branch Vessel is small in size.  Intervention  No interventions have been documented.   STRESS TESTS  MYOCARDIAL PERFUSION IMAGING 10/21/2018  Narrative  Nuclear stress EF: 68%.  There was no ST segment deviation noted during stress.  The study is normal.  The left ventricular ejection fraction is hyperdynamic (>65%).  1. EF 68%, normal wall motion. 2. No evidence for ischemia or infarction by perfusion images.  Normal study.   ECHOCARDIOGRAM  ECHOCARDIOGRAM COMPLETE 05/06/2022  Narrative ECHOCARDIOGRAM REPORT    Patient Name:   Scott Rosales Date of Exam: 05/06/2022 Medical Rec #:  [id number]         Height:  71.0 in Accession #:    [id number]        Weight:       269.0 lb Date of Birth:  07/10/1961        BSA:          2.392 m Patient Age:    61 years          BP:           153/93 mmHg Patient Gender: M                 HR:           58 bpm. Exam Location:  [facility]  Procedure: 2D Echo, Cardiac Doppler, Color Doppler and Strain Analysis  Indications:    Coronary artery disease of native artery of native heart with stable angina pectoris ([place]) [I25.118 (ICD-10-CM)]; Mixed hyperlipidemia [E78.2 (ICD-10-CM)];  History:        Patient has prior history of Echocardiogram examinations, most recent 08/13/2019.  Sonographer:    Luane School RDCS Referring Phys: [id number] [place]   1. Left ventricular ejection fraction, by estimation, is 60 to 65%. The left ventricle has normal function. The left ventricle has no regional wall motion abnormalities. There is mild left ventricular hypertrophy. Left ventricular diastolic parameters are indeterminate. 2. Right ventricular systolic function is normal. The right ventricular size is normal.  There is normal pulmonary artery systolic pressure. 3. The mitral valve is normal in structure. No evidence of mitral valve regurgitation. No evidence of mitral stenosis. 4. The aortic valve is normal in structure. Aortic valve regurgitation is not visualized. No aortic stenosis is present. 5. The inferior vena cava is normal in size with greater than 50% respiratory variability, suggesting right atrial pressure of 3 mmHg.  FINDINGS Left Ventricle: Left ventricular ejection fraction, by estimation, is 60 to 65%. The left ventricle has normal function. The left ventricle has no regional wall motion abnormalities. The left ventricular internal cavity size was normal in size. There is mild left ventricular hypertrophy. Left ventricular diastolic parameters are indeterminate.  Right Ventricle: The right ventricular size is normal. No increase in right ventricular wall thickness. Right ventricular systolic function is normal. There is normal pulmonary artery systolic pressure. The tricuspid regurgitant velocity is 2.39 m/s, and with an assumed right atrial pressure of 3 mmHg, the estimated right ventricular systolic pressure is 0000000 mmHg.  Left Atrium: Left atrial size was normal in size.  Right Atrium: Right atrial size was normal in size.  Pericardium: There is no evidence of pericardial effusion.  Mitral Valve: The mitral valve is normal in structure. No evidence of mitral valve regurgitation. No evidence of mitral valve stenosis.  Tricuspid Valve: The tricuspid valve is normal in structure. Tricuspid valve regurgitation is mild . No evidence of tricuspid stenosis.  Aortic Valve: The aortic valve is normal in structure. Aortic valve regurgitation is not visualized. No aortic stenosis is present.  Pulmonic Valve: The pulmonic valve was normal in structure. Pulmonic valve regurgitation is not visualized. No evidence of pulmonic stenosis.  Aorta: The aortic root is normal in size and  structure.  Venous: The inferior vena cava is normal in size with greater than 50% respiratory variability, suggesting right atrial pressure of 3 mmHg.  IAS/Shunts: No atrial level shunt detected by color flow Doppler.   LEFT VENTRICLE PLAX 2D LVIDd:         5.10 cm   Diastology LVIDs:  3.50 cm   LV e' medial:    9.46 cm/s LV PW:         1.60 cm   LV E/e' medial:  11.4 LV IVS:        1.60 cm   LV e' lateral:   10.65 cm/s LVOT diam:     2.10 cm   LV E/e' lateral: 10.1 LV SV:         95 LV SV Index:   40 LVOT Area:     3.46 cm   RIGHT VENTRICLE             IVC RV S prime:     10.70 cm/s  IVC diam: 1.60 cm TAPSE (M-mode): 2.2 cm  LEFT ATRIUM             Index        RIGHT ATRIUM           Index LA diam:        4.20 cm 1.76 cm/m   RA Area:     14.90 cm LA Vol (A2C):   81.1 ml 33.91 ml/m  RA Volume:   37.90 ml  15.85 ml/m LA Vol (A4C):   62.4 ml 26.09 ml/m LA Biplane Vol: 75.0 ml 31.36 ml/m AORTIC VALVE             PULMONIC VALVE LVOT Vmax:   113.00 cm/s PR End Diast Vel: 4.75 msec LVOT Vmean:  85.900 cm/s LVOT VTI:    0.274 m  AORTA Ao Root diam: 3.90 cm Ao Asc diam:  3.70 cm Ao Desc diam: 2.70 cm  MITRAL VALVE                TRICUSPID VALVE MV Area (PHT): 4.19 cm     TR Peak grad:   22.8 mmHg MV Decel Time: 181 msec     TR Vmax:        239.00 cm/s MV E velocity: 108.00 cm/s MV A velocity: 94.30 cm/s   SHUNTS MV E/A ratio:  1.15         Systemic VTI:  0.27 m Systemic Diam: 2.10 cm  Jenne Campus MD Electronically signed by Jenne Campus MD Signature Date/Time: 05/06/2022/4:34:13 PM    Final   TEE  ECHO TEE 10/13/2017  Interpretation Summary  Septum: No Patent Foramen Ovale present.  Left atrium: Patent foramen ovale not present.  Left atrium: No spontaneous echo contrast.  Aortic valve: The valve is trileaflet. No stenosis. No regurgitation.  Mitral valve: No leaflet thickening and calcification present. Trace regurgitation.  Right  ventricle: Normal wall thickness and ejection fraction. Cavity is mildly dilated. Normal tricuspid annular plane systolic excursion (TAPSE).  Tricuspid valve: Trace regurgitation.   MONITORS  LONG TERM MONITOR (3-14 DAYS) 09/25/2020  Narrative The patient wore the monitor for 30 days, 2 hours starting September 04, 2020. Indication: Palpitations The minimum heart rate was 47 bpm, maximum heart rate was 111 bpm, and average heart rate was 61 bpm. Predominant underlying rhythm was Sinus Rhythm.   Premature atrial complexes were rare less than 1%. Premature Ventricular complexes were rare less than 1%.  No ventricular tachycardia, no pauses, No AV block, and no atrial fibrillation present. 1 patient triggered event noted associated with sinus rhythm.  Conclusion: Normal/unremarkable study with no evidence of significant arrhythmia.           EKG:  EKG ordered today and personally reviewed.  The ekg ordered today demonstrates sinus rhythm 1 PVC stable  pattern of LVH and repolarization unchanged from June 2023  Recent Labs: No results found for requested labs within last 365 days.  Recent Lipid Panel    Component Value Date/Time   CHOL 187 05/01/2022 1426   TRIG 215 (H) 05/01/2022 1426   HDL 64 05/01/2022 1426   CHOLHDL 2.9 05/01/2022 1426   CHOLHDL 4.4 10/12/2017 0139   VLDL 25 10/12/2017 0139   LDLCALC 87 05/01/2022 1426   LDLDIRECT 57 08/25/2019 1053    Physical Exam:    VS:  BP 124/78 (BP Location: Right Arm, Patient Position: Sitting)   Pulse 61   Ht '5\' 11"'$  (1.803 m)   Wt 269 lb 3.2 oz (122.1 kg)   SpO2 94%   BMI 37.55 kg/m     Wt Readings from Last 3 Encounters:  01/14/23 269 lb 3.2 oz (122.1 kg)  05/01/22 269 lb (122 kg)  07/09/21 267 lb 12.8 oz (121.5 kg)     GEN: He does not look sick or debilitated well nourished, well developed in no acute distress HEENT: Normal NECK: No JVD; No carotid bruits LYMPHATICS: No lymphadenopathy CARDIAC: RRR, no murmurs,  rubs, gallops RESPIRATORY:  Clear to auscultation without rales, wheezing or rhonchi  ABDOMEN: Soft, non-tender, non-distended MUSCULOSKELETAL:  No edema; No deformity  SKIN: Warm and dry NEUROLOGIC:  Alert and oriented x 3 PSYCHIATRIC:  Normal affect    Signed, Shirlee More, MD  01/14/2023 2:36 PM    [facility]

## 2023-01-14 ENCOUNTER — Ambulatory Visit: Payer: 59 | Attending: Cardiology | Admitting: Cardiology

## 2023-01-14 ENCOUNTER — Encounter: Payer: Self-pay | Admitting: Cardiology

## 2023-01-14 VITALS — BP 124/78 | HR 61 | Ht 71.0 in | Wt 269.2 lb

## 2023-01-14 DIAGNOSIS — I10 Essential (primary) hypertension: Secondary | ICD-10-CM

## 2023-01-14 DIAGNOSIS — Z789 Other specified health status: Secondary | ICD-10-CM

## 2023-01-14 DIAGNOSIS — I25118 Atherosclerotic heart disease of native coronary artery with other forms of angina pectoris: Secondary | ICD-10-CM

## 2023-01-14 DIAGNOSIS — I493 Ventricular premature depolarization: Secondary | ICD-10-CM

## 2023-01-14 DIAGNOSIS — E782 Mixed hyperlipidemia: Secondary | ICD-10-CM

## 2023-01-14 NOTE — Patient Instructions (Signed)
Medication Instructions:  Your physician recommends that you continue on your current medications as directed. Please refer to the Current Medication list given to you today.  *If you need a refill on your cardiac medications before your next appointment, please call your pharmacy*   Lab Work: None If you have labs (blood work) drawn today and your tests are completely normal, you will receive your results only by: [place] (if you have MyChart) OR A paper copy in the mail If you have any lab test that is abnormal or we need to change your treatment, we will call you to review the results.   Testing/Procedures: None   Follow-Up: At [facility], you and your health needs are our priority.  As part of our continuing mission to provide you with exceptional heart care, we have created designated Provider Care Teams.  These Care Teams include your primary Cardiologist (physician) and Advanced Practice Providers (APPs -  Physician Assistants and Nurse Practitioners) who all work together to provide you with the care you need, when you need it.  We recommend signing up for the patient portal called "MyChart".  Sign up information is provided on this After Visit Summary.  MyChart is used to connect with patients for Virtual Visits (Telemedicine).  Patients are able to view lab/test results, encounter notes, upcoming appointments, etc.  Non-urgent messages can be sent to your provider as well.   To learn more about what you can do with MyChart, go to [email or web address].    Your next appointment:   3 month(s)  Provider:   Shirlee More, MD    Other Instructions Check blood pressures daily and record. Contact the office if systolic blood pressures are > 140.

## 2023-02-11 ENCOUNTER — Other Ambulatory Visit: Payer: Self-pay | Admitting: Cardiology

## 2023-02-11 DIAGNOSIS — I25118 Atherosclerotic heart disease of native coronary artery with other forms of angina pectoris: Secondary | ICD-10-CM

## 2023-02-11 DIAGNOSIS — I214 Non-ST elevation (NSTEMI) myocardial infarction: Secondary | ICD-10-CM

## 2023-02-11 DIAGNOSIS — E782 Mixed hyperlipidemia: Secondary | ICD-10-CM

## 2023-03-02 ENCOUNTER — Other Ambulatory Visit: Payer: Self-pay | Admitting: Cardiology

## 2023-03-03 NOTE — Telephone Encounter (Signed)
Rx to pharmacy

## 2023-03-13 ENCOUNTER — Telehealth: Payer: Self-pay | Admitting: Cardiology

## 2023-03-13 DIAGNOSIS — I25118 Atherosclerotic heart disease of native coronary artery with other forms of angina pectoris: Secondary | ICD-10-CM

## 2023-03-13 DIAGNOSIS — E782 Mixed hyperlipidemia: Secondary | ICD-10-CM

## 2023-03-13 DIAGNOSIS — I214 Non-ST elevation (NSTEMI) myocardial infarction: Secondary | ICD-10-CM

## 2023-03-13 MED ORDER — REPATHA SURECLICK 140 MG/ML ~~LOC~~ SOAJ: 140.0000 mg | SUBCUTANEOUS | 2 refills | Status: DC

## 2023-03-13 NOTE — Telephone Encounter (Signed)
*  STAT* If patient is at the pharmacy, call can be transferred to refill team.   1. Which medications need to be refilled? (please list name of each medication and dose if known) REPATHA SURECLICK 140 MG/ML SOAJ   2. Which pharmacy/location (including street and city if local pharmacy) is medication to be sent to? [facility] - [place], [place] - [address] [place]   3. Do they need a 30 day or 90 day supply? 90   Patient is out of medication

## 2023-03-13 NOTE — Telephone Encounter (Signed)
Refill of Repatha 140 mg sent to [facility].

## 2023-05-05 NOTE — Progress Notes (Unsigned)
Cardiology Office Note:    Date:  05/06/2023   ID:  Scott Rosales, DOB 05/09/1961, MRN [id number]  PCP:  Scott Sieve, MD  Cardiologist:  Scott Herrlich, MD    Referring MD: Scott Rosales,*    ASSESSMENT:    1. Coronary artery disease of native artery of native heart with stable angina pectoris (HCC)   2. Mixed hyperlipidemia   3. Statin intolerance   4. Primary hypertension   5. PVC's (premature ventricular contractions)    PLAN:    In order of problems listed above:  Scott Rosales continues to do well having no angina and good medical therapy including his long-term dual antiplatelet beta-blocker nonstatin lipid-lowering therapy with Repatha and his current antihypertensives Continue his current medications Check a lipid profile CMP Continue beta-blocker having no symptomatic PVCs   Next appointment: 6 months   Medication Adjustments/Labs and Tests Ordered: Current medicines are reviewed at length with the patient today.  Concerns regarding medicines are outlined above.  No orders of the defined types were placed in this encounter.  No orders of the defined types were placed in this encounter.    History of Present Illness:    Scott Rosales is a 62 y.o. male with a hx of CAD with CABG 2018 and non-ST elevation of following COVID infection with large thrombus burden in the proximal LAD requiring aspiration thrombectomy of the proximal LAD and PCI and stenting of the diagonal branch hypertension hyperlipidemia elevated LP(a) and statin intolerance last seen 01/14/2023.  Compliance with diet, lifestyle and medications: Yes  His attitude is good he is in the midst of radiation therapy for prostate cancer He finds himself fatigued Has had no angina edema shortness of breath palpitation or syncope and tolerates his lipid-lowering therapy He saw ENT many years ago no records in epic but there was an abnormality in the area of the left ear or jaw  or mastoid he was to have a follow-up MRI but could not tolerate the scanner and he is really had no symptoms until recently where he is having difficulty opening the jaw and pain in the area of the left ear and left jaw I advised him he should follow-up with ENT again and we will reach out to the radiation therapy at [facility] to try to make an arrangement Past Medical History:  Diagnosis Date   AIVR (accelerated idioventricular rhythm) (HCC)    a. reperfusion rhythm noted 08/2019 after PCI.   CAD (coronary artery disease), native coronary artery 10/15/2017   a. NSTEMI 10/2017 s/p CABGx4 with LIMA to LAD, SVG to OM1-2, SVG to PDA. b. NSTEMI 08/2019 s/p PTCA of prox LAD and PCI/DES to ostial D1, normal LVEF.   CKD (chronic kidney disease), stage II    GERD (gastroesophageal reflux disease) 06/14/2013   Hypercholesteremia 05/19/2007   Hyperlipidemia 10/16/2017   Hypertension 06/14/2013   Junctional rhythm 08/13/2019   Lightheadedness 09/04/2020   Non-ST elevation (NSTEMI) myocardial infarction (HCC) 10/12/2017   Palpitation 09/09/2018   Pre-hypertension 06/14/2013   Prediabetes 12/26/2017   PVC's (premature ventricular contractions) 10/27/2018   S/P CABG x 4 10/13/2017    Past Surgical History:  Procedure Laterality Date   CARDIAC CATHETERIZATION     CORONARY ARTERY BYPASS GRAFT N/A 10/13/2017   Procedure: CORONARY ARTERY BYPASS GRAFTING (CABG) x 4, LIMA to LAD, SVG to OM1 and distal circ, SVG to PDA, using left internal mammary artery and right greater saphenous vein harvested endoscopically;  Surgeon: Delight Ovens, MD;  Location: [facility] OR;  Service: Open Heart Surgery;  Laterality: N/A;   CORONARY PRESSURE/FFR STUDY N/A 10/12/2017   Procedure: INTRAVASCULAR PRESSURE WIRE/FFR STUDY;  Surgeon: Marykay Lex, MD;  Location: [facility] INVASIVE CV LAB;  Service: Cardiovascular;  Laterality: N/A;   CORONARY STENT INTERVENTION N/A 08/12/2019   Procedure: CORONARY STENT INTERVENTION;  Surgeon: Yvonne Kendall, MD;   Location: [facility] INVASIVE CV LAB;  Service: Cardiovascular;  Laterality: N/A;   LEFT HEART CATH AND CORONARY ANGIOGRAPHY N/A 10/12/2017   Procedure: LEFT HEART CATH AND CORONARY ANGIOGRAPHY;  Surgeon: Marykay Lex, MD;  Location: [facility] INVASIVE CV LAB;  Service: Cardiovascular;  Laterality: N/A;   LEFT HEART CATH AND CORS/GRAFTS ANGIOGRAPHY N/A 08/12/2019   Procedure: LEFT HEART CATH AND CORS/GRAFTS ANGIOGRAPHY;  Surgeon: Yvonne Kendall, MD;  Location: [facility] INVASIVE CV LAB;  Service: Cardiovascular;  Laterality: N/A;   TEE WITHOUT CARDIOVERSION  10/13/2017   Procedure: TRANSESOPHAGEAL ECHOCARDIOGRAM (TEE);  Surgeon: Delight Ovens, MD;  Location: [facility] OR;  Service: Open Heart Surgery;;   ULTRASOUND GUIDANCE FOR VASCULAR ACCESS  10/12/2017   Procedure: Ultrasound Guidance For Vascular Access;  Surgeon: Marykay Lex, MD;  Location: [facility] INVASIVE CV LAB;  Service: Cardiovascular;;    Current Medications: Current Meds  Medication Sig   acebutolol (SECTRAL) 200 MG capsule TAKE 1 CAPSULE BY MOUTH  DAILY   aspirin EC 81 MG tablet Take 81 mg by mouth daily.   clopidogrel (PLAVIX) 75 MG tablet TAKE 1 TABLET BY MOUTH DAILY   Coenzyme Q10 (CO Q-10 PO) Take 1 capsule by mouth daily.   Evolocumab (REPATHA SURECLICK) 140 MG/ML SOAJ Inject 140 mg into the skin every 14 (fourteen) days.   lisinopril-hydrochlorothiazide (ZESTORETIC) 20-12.5 MG tablet Take 1 tablet by mouth daily.   Multiple Vitamins-Minerals (MENS 50+ MULTI VITAMIN/MIN) TABS Take 1 tablet by mouth daily.   omeprazole (PRILOSEC) 20 MG capsule Take 20 mg by mouth daily as needed for indigestion.   tadalafil (CIALIS) 10 MG tablet Take 1 tablet (10 mg total) by mouth daily as needed for erectile dysfunction.     Allergies:   Nitroglycerin and Statins   Social History   Socioeconomic History   Marital status: Married    Spouse name: Not on file   Number of children: Not on file   Years of education: Not on file   Highest education level:  Not on file  Occupational History   Not on file  Tobacco Use   Smoking status: Never   Smokeless tobacco: Never  Vaping Use   Vaping Use: Never used  Substance and Sexual Activity   Alcohol use: Never   Drug use: Never   Sexual activity: Not on file  Other Topics Concern   Not on file  Social History Narrative   Not on file   Social Determinants of Health   Financial Resource Strain: Not on file  Food Insecurity: Not on file  Transportation Needs: Not on file  Physical Activity: Not on file  Stress: Not on file  Social Connections: Not on file       EKGs/Labs/Other Studies Reviewed:    The following studies were reviewed today:  Cardiac Studies & Procedures   CARDIAC CATHETERIZATION  CARDIAC CATHETERIZATION 08/12/2019  Narrative Conclusions: 1. Severe three-vessel coronary artery disease, as detailed below.  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 2. Widely patent LIMA-LAD and  SVG-OM1-LCx. 3. Ostially occluded SVG-rPDA; this appears chronic. 4. Normal left ventricular systolic function. 5. Mildly elevated LVEDP. 6. Successful aspiration thrombectomy of the proximal LAD and PCI to ostial D1 using a Resolute Onyx 2.5 x 12 mm drug-eluting stent.  There is a small amount of residual thrombus in the proximal LAD and 0% residual stenosis with TIMI-3 flow in D1.  Recommendations: 1. Continue tirofiban infusion for 24 hours. 2. Indefinite dual antiplatelet therapy with aspirin and prasugrel. 3. Aggressive secondary prevention, including continued alirocumab use.  Yvonne Kendall, MD [facility] Pager: [phone]  Findings Coronary Findings Diagnostic  Dominance: Right  Left Main Vessel is large. 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  Left Anterior Descending The vessel has an aneurysm. Ost LAD to Prox LAD lesion is 70% stenosed. The  lesion is heavily thrombotic. Prox LAD lesion is 100% stenosed. Mid LAD lesion is 50% stenosed.  First Diagonal Branch Vessel is moderate in size. Ost 1st Diag lesion is 90% stenosed.  First Septal Branch Vessel is small in size.  Second Septal Branch Vessel is small in size.  Third Diagonal Branch Vessel is small in size.  Left Circumflex Vessel is large. Mid Cx lesion is 90% stenosed. The lesion is eccentric, irregular, thrombotic and ulcerative.  Second Obtuse Marginal Branch Vessel is large in size. This is actually OM1 Ost 2nd Mrg lesion is 95% stenosed. The lesion is located at the bifurcation, eccentric, irregular, thrombotic and ulcerative.  Left Posterior Atrioventricular Artery Vessel is small in size.  Right Coronary Artery Vessel is large. There is mild diffuse disease throughout the vessel. There is mild focal disease in the vessel. The vessel is moderately tortuous.  Right Posterior Descending Artery Ost RPDA lesion is 100% stenosed. The lesion is discrete with left-to-right collateral flow. Likely subacute occlusion  Right Posterior Atrioventricular Artery Vessel is moderate in size.  First Right Posterolateral Branch Vessel is small in size.  Saphenous Graft To Ost RPDA SVG graft was visualized by angiography. Origin lesion is 100% stenosed. The lesion is chronically occluded.  Sequential Saphenous Graft To 2nd Mrg, Ost 3rd Mrg SVG graft was visualized by angiography and is normal in caliber. The graft exhibits no disease.  LIMA LIMA Graft To Mid LAD LIMA graft was visualized by angiography and is large. The graft exhibits no disease.  Intervention  Ost LAD to Prox LAD lesion Thrombectomy Aspiration thrombectomy performed using a CATH EXTRAC PRONTO 5.48F 138CM. Post-Intervention Lesion Assessment The intervention was successful. Pre-interventional TIMI flow is 3. Post-intervention TIMI flow is 3. No complications occurred at this lesion. There  is a 20% residual stenosis post intervention.  Ost 1st Diag lesion Stent A drug-eluting stent was successfully placed using a STENT RESOLUTE ONYX 2.5X12. Post-Intervention Lesion Assessment The intervention was successful. Pre-interventional TIMI flow is 2. Post-intervention TIMI flow is 3. No complications occurred at this lesion. There is a 0% residual stenosis post intervention.   CARDIAC CATHETERIZATION  CARDIAC CATHETERIZATION 10/12/2017  Narrative Images from the original result were not included.   Ost RPDA lesion is 100% stenosed.  Bifurcation Cx-OM1 Ost 1st Mrg lesion is 95% stenosed. Mid Cx lesion is 90% stenosed.  Prox LAD lesion is 55% stenosed. Mid LAD lesion is 70% stenosed. FFR 0.79.  The left ventricular systolic function is normal. The left ventricular ejection fraction is greater than 65% by visual estimate.  LV end diastolic pressure is normal.   SEVERE 3-4 VESSEL CAD: 100% (Subacute) ostRPDA, 90% mCx @ OM1 with 95% OM1 (bifurcation  0,1,1) lesions (hazy, irregular/thrombotic), diffuse mLAD ~70% lesion (FFR 0.79).  Mild to moderate disease throughout the RCA with RPL.  Ectatic, aneurysmal left main, ostial-proximal LAD dilation -not optimal for guide placement.  Well-preserved LVEF with mid inferior wall hypokinesis but otherwise no significant wall motion and normalities.  Moderately elevated LVEDP   PLAN OF CARE: Return to nursing unit for ongoing care.  TR band removal per protocol  The patient has three-vessel disease.  We are in the process of calling CV TS for CABG consult  I will start IV Aggrastat now and restart heparin in 8 hours given the ulcerated thrombotic  appearance of the circumflex bifurcation lesion and is ongoing pain.  We will start low-dose beta-blocker and high-dose statin  He will had to return back to his nursing unit because there are no stepdown or ICU beds available --he is currently chest pain-free     Bryan Lemma,  M.D., M.S. Interventional Cardiologist  Pager # [phone] Phone # [phone] [address] [place], [place] [address]  Findings Coronary Findings Diagnostic  Dominance: Right  Left Main Vessel is large.  Left Anterior Descending Prox LAD lesion is 55% stenosed. Mid LAD lesion is 70% stenosed. Pressure wire/FFR was performed on the lesion. FFR: 0.79.  First Diagonal Branch Vessel is moderate in size.  First Septal Branch Vessel is small in size.  Second Septal Branch Vessel is small in size.  Third Diagonal Branch Vessel is small in size.  Left Circumflex Vessel is large. Mid Cx lesion is 90% stenosed. The lesion is eccentric, irregular, thrombotic and ulcerative.  Second Obtuse Marginal Branch Vessel is large in size. This is actually OM1 Ost 2nd Mrg lesion is 95% stenosed. The lesion is located at the bifurcation, eccentric, irregular, thrombotic and ulcerative.  Left Posterior Atrioventricular Artery Vessel is small in size.  Right Coronary Artery Vessel is large. There is mild diffuse disease throughout the vessel. There is mild focal disease in the vessel. The vessel is moderately tortuous.  Right Posterior Descending Artery Ost RPDA lesion is 100% stenosed. The lesion is discrete with left-to-right collateral flow. Likely subacute occlusion  Right Posterior Atrioventricular Artery Vessel is moderate in size.  First Right Posterolateral Branch Vessel is small in size.  Intervention  No interventions have been documented.   STRESS TESTS  MYOCARDIAL PERFUSION IMAGING 10/21/2018  Narrative  Nuclear stress EF: 68%.  There was no ST segment deviation noted during stress.  The study is normal.  The left ventricular ejection fraction is hyperdynamic (>65%).  1. EF 68%, normal wall motion. 2. No evidence for ischemia or infarction by perfusion images.  Normal study.   ECHOCARDIOGRAM  ECHOCARDIOGRAM COMPLETE  05/06/2022  Narrative ECHOCARDIOGRAM REPORT    Patient Name:   Scott Rosales Date of Exam: 05/06/2022 Medical Rec #:  [id number]         Height:       71.0 in Accession #:    [id number]        Weight:       269.0 lb Date of Birth:  1961/01/07        BSA:          2.392 m Patient Age:    60 years          BP:           153/93 mmHg Patient Gender: M                 HR:  58 bpm. Exam Location:  [facility]  Procedure: 2D Echo, Cardiac Doppler, Color Doppler and Strain Analysis  Indications:    Coronary artery disease of native artery of native heart with stable angina pectoris (HCC) [I25.118 (ICD-10-CM)]; Mixed hyperlipidemia [E78.2 (ICD-10-CM)];  History:        Patient has prior history of Echocardiogram examinations, most recent 08/13/2019.  Sonographer:    Louie Boston RDCS Referring Phys: [id number] Grissel Tyrell J Regis Hinton  IMPRESSIONS   1. Left ventricular ejection fraction, by estimation, is 60 to 65%. The left ventricle has normal function. The left ventricle has no regional wall motion abnormalities. There is mild left ventricular hypertrophy. Left ventricular diastolic parameters are indeterminate. 2. Right ventricular systolic function is normal. The right ventricular size is normal. There is normal pulmonary artery systolic pressure. 3. The mitral valve is normal in structure. No evidence of mitral valve regurgitation. No evidence of mitral stenosis. 4. The aortic valve is normal in structure. Aortic valve regurgitation is not visualized. No aortic stenosis is present. 5. The inferior vena cava is normal in size with greater than 50% respiratory variability, suggesting right atrial pressure of 3 mmHg.  FINDINGS Left Ventricle: Left ventricular ejection fraction, by estimation, is 60 to 65%. The left ventricle has normal function. The left ventricle has no regional wall motion abnormalities. The left ventricular internal cavity size was normal in size. There is mild left  ventricular hypertrophy. Left ventricular diastolic parameters are indeterminate.  Right Ventricle: The right ventricular size is normal. No increase in right ventricular wall thickness. Right ventricular systolic function is normal. There is normal pulmonary artery systolic pressure. The tricuspid regurgitant velocity is 2.39 m/s, and with an assumed right atrial pressure of 3 mmHg, the estimated right ventricular systolic pressure is 25.8 mmHg.  Left Atrium: Left atrial size was normal in size.  Right Atrium: Right atrial size was normal in size.  Pericardium: There is no evidence of pericardial effusion.  Mitral Valve: The mitral valve is normal in structure. No evidence of mitral valve regurgitation. No evidence of mitral valve stenosis.  Tricuspid Valve: The tricuspid valve is normal in structure. Tricuspid valve regurgitation is mild . No evidence of tricuspid stenosis.  Aortic Valve: The aortic valve is normal in structure. Aortic valve regurgitation is not visualized. No aortic stenosis is present.  Pulmonic Valve: The pulmonic valve was normal in structure. Pulmonic valve regurgitation is not visualized. No evidence of pulmonic stenosis.  Aorta: The aortic root is normal in size and structure.  Venous: The inferior vena cava is normal in size with greater than 50% respiratory variability, suggesting right atrial pressure of 3 mmHg.  IAS/Shunts: No atrial level shunt detected by color flow Doppler.   LEFT VENTRICLE PLAX 2D LVIDd:         5.10 cm   Diastology LVIDs:         3.50 cm   LV e' medial:    9.46 cm/s LV PW:         1.60 cm   LV E/e' medial:  11.4 LV IVS:        1.60 cm   LV e' lateral:   10.65 cm/s LVOT diam:     2.10 cm   LV E/e' lateral: 10.1 LV SV:         95 LV SV Index:   40 LVOT Area:     3.46 cm   RIGHT VENTRICLE             IVC RV  S prime:     10.70 cm/s  IVC diam: 1.60 cm TAPSE (M-mode): 2.2 cm  LEFT ATRIUM             Index        RIGHT ATRIUM            Index LA diam:        4.20 cm 1.76 cm/m   RA Area:     14.90 cm LA Vol (A2C):   81.1 ml 33.91 ml/m  RA Volume:   37.90 ml  15.85 ml/m LA Vol (A4C):   62.4 ml 26.09 ml/m LA Biplane Vol: 75.0 ml 31.36 ml/m AORTIC VALVE             PULMONIC VALVE LVOT Vmax:   113.00 cm/s PR End Diast Vel: 4.75 msec LVOT Vmean:  85.900 cm/s LVOT VTI:    0.274 m  AORTA Ao Root diam: 3.90 cm Ao Asc diam:  3.70 cm Ao Desc diam: 2.70 cm  MITRAL VALVE                TRICUSPID VALVE MV Area (PHT): 4.19 cm     TR Peak grad:   22.8 mmHg MV Decel Time: 181 msec     TR Vmax:        239.00 cm/s MV E velocity: 108.00 cm/s MV A velocity: 94.30 cm/s   SHUNTS MV E/A ratio:  1.15         Systemic VTI:  0.27 m Systemic Diam: 2.10 cm  Gypsy Balsam MD Electronically signed by Gypsy Balsam MD Signature Date/Time: 05/06/2022/4:34:13 PM    Final   TEE  ECHO TEE 10/13/2017  Interpretation Summary  Septum: No Patent Foramen Ovale present.  Left atrium: Patent foramen ovale not present.  Left atrium: No spontaneous echo contrast.  Aortic valve: The valve is trileaflet. No stenosis. No regurgitation.  Mitral valve: No leaflet thickening and calcification present. Trace regurgitation.  Right ventricle: Normal wall thickness and ejection fraction. Cavity is mildly dilated. Normal tricuspid annular plane systolic excursion (TAPSE).  Tricuspid valve: Trace regurgitation.   MONITORS  LONG TERM MONITOR (3-14 DAYS) 09/25/2020  Narrative The patient wore the monitor for 30 days, 2 hours starting September 04, 2020. Indication: Palpitations The minimum heart rate was 47 bpm, maximum heart rate was 111 bpm, and average heart rate was 61 bpm. Predominant underlying rhythm was Sinus Rhythm.   Premature atrial complexes were rare less than 1%. Premature Ventricular complexes were rare less than 1%.  No ventricular tachycardia, no pauses, No AV block, and no atrial fibrillation present. 1  patient triggered event noted associated with sinus rhythm.  Conclusion: Normal/unremarkable study with no evidence of significant arrhythmia.               Recent Labs: No results found for requested labs within last 365 days.  Recent Lipid Panel    Component Value Date/Time   CHOL 187 05/01/2022 1426   TRIG 215 (H) 05/01/2022 1426   HDL 64 05/01/2022 1426   CHOLHDL 2.9 05/01/2022 1426   CHOLHDL 4.4 10/12/2017 0139   VLDL 25 10/12/2017 0139   LDLCALC 87 05/01/2022 1426   LDLDIRECT 57 08/25/2019 1053    Physical Exam:    VS:  BP 128/86   Pulse (!) 56   Ht 5\' 11"  (1.803 m)   Wt 270 lb (122.5 kg)   SpO2 100%   BMI 37.66 kg/m     Wt Readings from Last 3 Encounters:  05/06/23 270 lb (122.5 kg)  01/14/23 269 lb 3.2 oz (122.1 kg)  05/01/22 269 lb (122 kg)     GEN:  Well nourished, well developed in no acute distress HEENT: Normal NECK: No JVD; No carotid bruits LYMPHATICS: No lymphadenopathy CARDIAC: RRR, no murmurs, rubs, gallops RESPIRATORY:  Clear to auscultation without rales, wheezing or rhonchi  ABDOMEN: Soft, non-tender, non-distended MUSCULOSKELETAL:  No edema; No deformity  SKIN: Warm and dry NEUROLOGIC:  Alert and oriented x 3 PSYCHIATRIC:  Normal affect    Signed, Scott Herrlich, MD  05/06/2023 1:44 PM    [facility]

## 2023-05-06 ENCOUNTER — Encounter: Payer: Self-pay | Admitting: Cardiology

## 2023-05-06 ENCOUNTER — Ambulatory Visit: Payer: 59 | Attending: Cardiology | Admitting: Cardiology

## 2023-05-06 VITALS — BP 128/86 | HR 56 | Ht 71.0 in | Wt 270.0 lb

## 2023-05-06 DIAGNOSIS — Z789 Other specified health status: Secondary | ICD-10-CM

## 2023-05-06 DIAGNOSIS — I10 Essential (primary) hypertension: Secondary | ICD-10-CM | POA: Diagnosis not present

## 2023-05-06 DIAGNOSIS — I493 Ventricular premature depolarization: Secondary | ICD-10-CM

## 2023-05-06 DIAGNOSIS — I25118 Atherosclerotic heart disease of native coronary artery with other forms of angina pectoris: Secondary | ICD-10-CM

## 2023-05-06 DIAGNOSIS — E782 Mixed hyperlipidemia: Secondary | ICD-10-CM | POA: Diagnosis not present

## 2023-05-06 NOTE — Patient Instructions (Signed)
Medication Instructions:  Your physician recommends that you continue on your current medications as directed. Please refer to the Current Medication list given to you today.  *If you need a refill on your cardiac medications before your next appointment, please call your pharmacy*   Lab Work: Your physician recommends that you return for lab work in:   Labs today: CMP, Lipid  If you have labs (blood work) drawn today and your tests are completely normal, you will receive your results only by: MyChart Message (if you have MyChart) OR A paper copy in the mail If you have any lab test that is abnormal or we need to change your treatment, we will call you to review the results.   Testing/Procedures: None   Follow-Up: At [facility], you and your health needs are our priority.  As part of our continuing mission to provide you with exceptional heart care, we have created designated Provider Care Teams.  These Care Teams include your primary Cardiologist (physician) and Advanced Practice Providers (APPs -  Physician Assistants and Nurse Practitioners) who all work together to provide you with the care you need, when you need it.  We recommend signing up for the patient portal called "MyChart".  Sign up information is provided on this After Visit Summary.  MyChart is used to connect with patients for Virtual Visits (Telemedicine).  Patients are able to view lab/test results, encounter notes, upcoming appointments, etc.  Non-urgent messages can be sent to your provider as well.   To learn more about what you can do with MyChart, go to [email or web address].    Your next appointment:   6 month(s)  Provider:   Norman Herrlich, MD    Other Instructions None

## 2023-05-07 LAB — COMPREHENSIVE METABOLIC PANEL
ALT: 28 IU/L (ref 0–44)
AST: 22 IU/L (ref 0–40)
Albumin: 4.1 g/dL (ref 3.9–4.9)
Alkaline Phosphatase: 68 IU/L (ref 44–121)
BUN/Creatinine Ratio: 19 (ref 10–24)
BUN: 19 mg/dL (ref 8–27)
Bilirubin Total: 0.3 mg/dL (ref 0.0–1.2)
CO2: 25 mmol/L (ref 20–29)
Calcium: 9.4 mg/dL (ref 8.6–10.2)
Chloride: 103 mmol/L (ref 96–106)
Creatinine, Ser: 1.01 mg/dL (ref 0.76–1.27)
Globulin, Total: 2.9 g/dL (ref 1.5–4.5)
Glucose: 84 mg/dL (ref 70–99)
Potassium: 4.3 mmol/L (ref 3.5–5.2)
Sodium: 141 mmol/L (ref 134–144)
Total Protein: 7 g/dL (ref 6.0–8.5)
eGFR: 84 mL/min/{1.73_m2} (ref >59)

## 2023-05-07 LAB — LIPID PANEL
Chol/HDL Ratio: 3.1 ratio (ref 0.0–5.0)
Cholesterol, Total: 180 mg/dL (ref 100–199)
HDL: 59 mg/dL (ref >39)
LDL Chol Calc (NIH): 61 mg/dL (ref 0–99)
Triglycerides: 394 mg/dL — ABNORMAL HIGH (ref 0–149)
VLDL Cholesterol Cal: 60 mg/dL — ABNORMAL HIGH (ref 5–40)

## 2023-06-05 ENCOUNTER — Other Ambulatory Visit: Payer: Self-pay | Admitting: Cardiology

## 2023-06-05 DIAGNOSIS — I493 Ventricular premature depolarization: Secondary | ICD-10-CM

## 2023-06-05 DIAGNOSIS — I25118 Atherosclerotic heart disease of native coronary artery with other forms of angina pectoris: Secondary | ICD-10-CM

## 2023-10-15 ENCOUNTER — Other Ambulatory Visit: Payer: Self-pay | Admitting: Cardiology

## 2023-10-15 DIAGNOSIS — I25118 Atherosclerotic heart disease of native coronary artery with other forms of angina pectoris: Secondary | ICD-10-CM

## 2023-10-15 DIAGNOSIS — I214 Non-ST elevation (NSTEMI) myocardial infarction: Secondary | ICD-10-CM

## 2023-10-15 DIAGNOSIS — E782 Mixed hyperlipidemia: Secondary | ICD-10-CM

## 2024-02-05 ENCOUNTER — Other Ambulatory Visit: Payer: Self-pay | Admitting: Cardiology

## 2024-02-15 ENCOUNTER — Other Ambulatory Visit: Payer: Self-pay | Admitting: Cardiology

## 2024-02-15 DIAGNOSIS — I493 Ventricular premature depolarization: Secondary | ICD-10-CM

## 2024-02-15 DIAGNOSIS — I25118 Atherosclerotic heart disease of native coronary artery with other forms of angina pectoris: Secondary | ICD-10-CM

## 2024-02-16 NOTE — Progress Notes (Unsigned)
 Cardiology Office Note:  .   Date:  02/17/2024  ID:  Scott Rosales, DOB 12-28-1960, MRN [id number] PCP: Cain Sieve, MD   HeartCare Providers Cardiologist:  Norman Herrlich, MD    History of Present Illness: .   Scott Rosales is a 63 y.o. male with a past medical history of CAD s/p CABG x 4 & PCI with DES 2020, hypertension, PVCs, junctional rhythm, CKD, GERD, dyslipidemia.  05/06/2022 echo EF 60 to 65%, mild LVH, indeterminate diastolic parameters 06/12/2021 LEA no evidence of stenosis 06/12/2021 ABI normal 10/05/2020 monitor average heart rate 61 bpm, predominant rhythm was sinus, overall unremarkable 08/12/2019 cardiac cath severe three-vessel CAD with successful aspiration thrombectomy of the proximal LAD and PCI to the ostial D1 with a DES; LIMA to LAD and SVG to OM1 and left circumflex were patent but ostially SVG to RPDA was occluded 2018 CABG x 4 LIMA to LAD, SVG to OM1 and distal circumflex, SVG to PDA  Most recently was evaluated by Dr. Dulce Sellar on 05/06/2023, he was stable from a cardiac perspective, was undergoing radiation for prostate cancer and dealing with fatigue but overall stable, and he was advised to follow-up in 6 months.  He presents today for follow-up of his CAD.  He has been doing stable since he was last evaluated in our office.  He is continue to be bothered by a " weak sensation" approximately from mid thigh down, does not necessarily feel like a paresthesia or change with sensation just feels weak.  He previously had ABIs and LP(a) completed which was unrevealing for peripheral artery disease.  He does not have any known diabetes.  He does have some pedal edema, and at times he has wore compression socks in the past which have helped with his symptoms.  He does have some slight changes on his EKGs however he is asymptomatic from a cardiac perspective.  He does not routinely exercise, he does play golf but typically rides a golf cart secondary to leg  weakness.  He has also been trying to lose weight, unfortunately continues to gain weight. He denies chest pain, palpitations, dyspnea, pnd, orthopnea, n, v, dizziness, syncope, edema, weight gain, or early satiety.    ROS: Review of Systems  Cardiovascular:  Positive for leg swelling.  Neurological:  Positive for weakness (legs feel weak).  All other systems reviewed and are negative.    Studies Reviewed: Marland Kitchen   EKG Interpretation Date/Time:  Tuesday February 17 2024 10:37:52 EDT Ventricular Rate:  56 PR Interval:  190 QRS Duration:  84 QT Interval:  440 QTC Calculation: 424 R Axis:   20  Text Interpretation: Sinus bradycardia Left ventricular hypertrophy with repolarization abnormality ( R in aVL ) When compared with ECG of 13-Aug-2019 05:57, Criteria for Septal infarct are no longer Present T wave inversion now evident in Lateral leads Confirmed by Wallis Bamberg [phone]) on 02/17/2024 10:55:43 AM    Cardiac Studies & Procedures   ______________________________________________________________________________________________ CARDIAC CATHETERIZATION  CARDIAC CATHETERIZATION 08/12/2019  Narrative Conclusions: 1. Severe three-vessel coronary artery disease, as detailed below.  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 2. Widely patent LIMA-LAD and SVG-OM1-LCx. 3. Ostially occluded SVG-rPDA; this appears chronic. 4. Normal left ventricular systolic function. 5. Mildly elevated LVEDP. 6. Successful aspiration thrombectomy of the proximal LAD and PCI to ostial D1 using a Resolute Onyx 2.5 x 12 mm  drug-eluting stent.  There is a small amount of residual thrombus in the proximal LAD and 0% residual stenosis with TIMI-3 flow in D1.  Recommendations: 1. Continue tirofiban infusion for 24 hours. 2. Indefinite dual antiplatelet therapy with aspirin and prasugrel. 3.  Aggressive secondary prevention, including continued alirocumab use.  Yvonne Kendall, MD [facility] Pager: [phone]  Findings Coronary Findings Diagnostic  Dominance: Right  Left Main Vessel is large. 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  Left Anterior Descending The vessel has an aneurysm. Ost LAD to Prox LAD lesion is 70% stenosed. The lesion is heavily thrombotic. Prox LAD lesion is 100% stenosed. Mid LAD lesion is 50% stenosed.  First Diagonal Branch Vessel is moderate in size. Ost 1st Diag lesion is 90% stenosed.  First Septal Branch Vessel is small in size.  Second Septal Branch Vessel is small in size.  Third Diagonal Branch Vessel is small in size.  Left Circumflex Vessel is large. Mid Cx lesion is 90% stenosed. The lesion is eccentric, irregular, thrombotic and ulcerative.  Second Obtuse Marginal Branch Vessel is large in size. This is actually OM1 Ost 2nd Mrg lesion is 95% stenosed. The lesion is located at the bifurcation, eccentric, irregular, thrombotic and ulcerative.  Left Posterior Atrioventricular Artery Vessel is small in size.  Right Coronary Artery Vessel is large. There is mild diffuse disease throughout the vessel. There is mild focal disease in the vessel. The vessel is moderately tortuous.  Right Posterior Descending Artery Ost RPDA lesion is 100% stenosed. The lesion is discrete with left-to-right collateral flow. Likely subacute occlusion  Right Posterior Atrioventricular Artery Vessel is moderate in size.  First Right Posterolateral Branch Vessel is small in size.  Saphenous Graft To Ost RPDA SVG graft was visualized by angiography. Origin lesion is 100% stenosed. The lesion is chronically occluded.  Sequential Saphenous Graft To 2nd Mrg, Ost 3rd Mrg SVG graft was visualized by angiography and is normal in caliber. The graft exhibits no disease.  LIMA LIMA Graft To Mid LAD LIMA graft was visualized by angiography and is  large. The graft exhibits no disease.  Intervention  Ost LAD to Prox LAD lesion Thrombectomy Aspiration thrombectomy performed using a CATH EXTRAC PRONTO 5.37F 138CM. Post-Intervention Lesion Assessment The intervention was successful. Pre-interventional TIMI flow is 3. Post-intervention TIMI flow is 3. No complications occurred at this lesion. There is a 20% residual stenosis post intervention.  Ost 1st Diag lesion Stent A drug-eluting stent was successfully placed using a STENT RESOLUTE ONYX 2.5X12. Post-Intervention Lesion Assessment The intervention was successful. Pre-interventional TIMI flow is 2. Post-intervention TIMI flow is 3. No complications occurred at this lesion. There is a 0% residual stenosis post intervention.   CARDIAC CATHETERIZATION  CARDIAC CATHETERIZATION 10/12/2017  Narrative Images from the original result were not included.   Ost RPDA lesion is 100% stenosed.  Bifurcation Cx-OM1 Ost 1st Mrg lesion is 95% stenosed. Mid Cx lesion is 90% stenosed.  Prox LAD lesion is 55% stenosed. Mid LAD lesion is 70% stenosed. FFR 0.79.  The left ventricular systolic function is normal. The left ventricular ejection fraction is greater than 65% by visual estimate.  LV end diastolic pressure is normal.   SEVERE 3-4 VESSEL CAD: 100% (Subacute) ostRPDA, 90% mCx @ OM1 with 95% OM1 (bifurcation 0,1,1) lesions (hazy, irregular/thrombotic), diffuse mLAD ~70% lesion (FFR 0.79).  Mild to moderate disease throughout the RCA with RPL.  Ectatic, aneurysmal left main, ostial-proximal LAD dilation -not optimal for guide placement.  Well-preserved LVEF with mid inferior  wall hypokinesis but otherwise no significant wall motion and normalities.  Moderately elevated LVEDP   PLAN OF CARE: Return to nursing unit for ongoing care.  TR band removal per protocol  The patient has three-vessel disease.  We are in the process of calling CV TS for CABG consult  I will start IV  Aggrastat now and restart heparin in 8 hours given the ulcerated thrombotic  appearance of the circumflex bifurcation lesion and is ongoing pain.  We will start low-dose beta-blocker and high-dose statin  He will had to return back to his nursing unit because there are no stepdown or ICU beds available --he is currently chest pain-free     Bryan Lemma, M.D., M.S. Interventional Cardiologist  Pager # [phone] Phone # [phone] [address] [place], [place] [address]  Findings Coronary Findings Diagnostic  Dominance: Right  Left Main Vessel is large.  Left Anterior Descending Prox LAD lesion is 55% stenosed. Mid LAD lesion is 70% stenosed. Pressure wire/FFR was performed on the lesion. FFR: 0.79.  First Diagonal Branch Vessel is moderate in size.  First Septal Branch Vessel is small in size.  Second Septal Branch Vessel is small in size.  Third Diagonal Branch Vessel is small in size.  Left Circumflex Vessel is large. Mid Cx lesion is 90% stenosed. The lesion is eccentric, irregular, thrombotic and ulcerative.  Second Obtuse Marginal Branch Vessel is large in size. This is actually OM1 Ost 2nd Mrg lesion is 95% stenosed. The lesion is located at the bifurcation, eccentric, irregular, thrombotic and ulcerative.  Left Posterior Atrioventricular Artery Vessel is small in size.  Right Coronary Artery Vessel is large. There is mild diffuse disease throughout the vessel. There is mild focal disease in the vessel. The vessel is moderately tortuous.  Right Posterior Descending Artery Ost RPDA lesion is 100% stenosed. The lesion is discrete with left-to-right collateral flow. Likely subacute occlusion  Right Posterior Atrioventricular Artery Vessel is moderate in size.  First Right Posterolateral Branch Vessel is small in size.  Intervention  No interventions have been documented.   STRESS TESTS  MYOCARDIAL PERFUSION IMAGING  10/21/2018  Narrative  Nuclear stress EF: 68%.  There was no ST segment deviation noted during stress.  The study is normal.  The left ventricular ejection fraction is hyperdynamic (>65%).  1. EF 68%, normal wall motion. 2. No evidence for ischemia or infarction by perfusion images.  Normal study.   ECHOCARDIOGRAM  ECHOCARDIOGRAM COMPLETE 05/06/2022  Narrative ECHOCARDIOGRAM REPORT    Patient Name:   KNOWLEDGE ESCANDON Date of Exam: 05/06/2022 Medical Rec #:  [id number]         Height:       71.0 in Accession #:    [id number]        Weight:       269.0 lb Date of Birth:  [birth year]        BSA:          2.392 m Patient Age:    60 years          BP:           153/93 mmHg Patient Gender: M                 HR:           58 bpm. Exam Location:  [place]  Procedure: 2D Echo, Cardiac Doppler, Color Doppler and Strain Analysis  Indications:    Coronary artery disease of native artery of native heart with  stable angina pectoris (HCC) [I25.118 (ICD-10-CM)]; Mixed hyperlipidemia [E78.2 (ICD-10-CM)];  History:        Patient has prior history of Echocardiogram examinations, most recent 08/13/2019.  Sonographer:    Louie Boston RDCS Referring Phys: [id number] BRIAN J MUNLEY  IMPRESSIONS   1. Left ventricular ejection fraction, by estimation, is 60 to 65%. The left ventricle has normal function. The left ventricle has no regional wall motion abnormalities. There is mild left ventricular hypertrophy. Left ventricular diastolic parameters are indeterminate. 2. Right ventricular systolic function is normal. The right ventricular size is normal. There is normal pulmonary artery systolic pressure. 3. The mitral valve is normal in structure. No evidence of mitral valve regurgitation. No evidence of mitral stenosis. 4. The aortic valve is normal in structure. Aortic valve regurgitation is not visualized. No aortic stenosis is present. 5. The inferior vena cava is normal in size with greater  than 50% respiratory variability, suggesting right atrial pressure of 3 mmHg.  FINDINGS Left Ventricle: Left ventricular ejection fraction, by estimation, is 60 to 65%. The left ventricle has normal function. The left ventricle has no regional wall motion abnormalities. The left ventricular internal cavity size was normal in size. There is mild left ventricular hypertrophy. Left ventricular diastolic parameters are indeterminate.  Right Ventricle: The right ventricular size is normal. No increase in right ventricular wall thickness. Right ventricular systolic function is normal. There is normal pulmonary artery systolic pressure. The tricuspid regurgitant velocity is 2.39 m/s, and with an assumed right atrial pressure of 3 mmHg, the estimated right ventricular systolic pressure is 25.8 mmHg.  Left Atrium: Left atrial size was normal in size.  Right Atrium: Right atrial size was normal in size.  Pericardium: There is no evidence of pericardial effusion.  Mitral Valve: The mitral valve is normal in structure. No evidence of mitral valve regurgitation. No evidence of mitral valve stenosis.  Tricuspid Valve: The tricuspid valve is normal in structure. Tricuspid valve regurgitation is mild . No evidence of tricuspid stenosis.  Aortic Valve: The aortic valve is normal in structure. Aortic valve regurgitation is not visualized. No aortic stenosis is present.  Pulmonic Valve: The pulmonic valve was normal in structure. Pulmonic valve regurgitation is not visualized. No evidence of pulmonic stenosis.  Aorta: The aortic root is normal in size and structure.  Venous: The inferior vena cava is normal in size with greater than 50% respiratory variability, suggesting right atrial pressure of 3 mmHg.  IAS/Shunts: No atrial level shunt detected by color flow Doppler.   LEFT VENTRICLE PLAX 2D LVIDd:         5.10 cm   Diastology LVIDs:         3.50 cm   LV e' medial:    9.46 cm/s LV PW:         1.60  cm   LV E/e' medial:  11.4 LV IVS:        1.60 cm   LV e' lateral:   10.65 cm/s LVOT diam:     2.10 cm   LV E/e' lateral: 10.1 LV SV:         95 LV SV Index:   40 LVOT Area:     3.46 cm   RIGHT VENTRICLE             IVC RV S prime:     10.70 cm/s  IVC diam: 1.60 cm TAPSE (M-mode): 2.2 cm  LEFT ATRIUM  Index        RIGHT ATRIUM           Index LA diam:        4.20 cm 1.76 cm/m   RA Area:     14.90 cm LA Vol (A2C):   81.1 ml 33.91 ml/m  RA Volume:   37.90 ml  15.85 ml/m LA Vol (A4C):   62.4 ml 26.09 ml/m LA Biplane Vol: 75.0 ml 31.36 ml/m AORTIC VALVE             PULMONIC VALVE LVOT Vmax:   113.00 cm/s PR End Diast Vel: 4.75 msec LVOT Vmean:  85.900 cm/s LVOT VTI:    0.274 m  AORTA Ao Root diam: 3.90 cm Ao Asc diam:  3.70 cm Ao Desc diam: 2.70 cm  MITRAL VALVE                TRICUSPID VALVE MV Area (PHT): 4.19 cm     TR Peak grad:   22.8 mmHg MV Decel Time: 181 msec     TR Vmax:        239.00 cm/s MV E velocity: 108.00 cm/s MV A velocity: 94.30 cm/s   SHUNTS MV E/A ratio:  1.15         Systemic VTI:  0.27 m Systemic Diam: 2.10 cm  Gypsy Balsam MD Electronically signed by Gypsy Balsam MD Signature Date/Time: 05/06/2022/4:34:13 PM    Final   TEE  ECHO TEE 10/13/2017  Interpretation Summary  Septum: No Patent Foramen Ovale present.  Left atrium: Patent foramen ovale not present.  Left atrium: No spontaneous echo contrast.  Aortic valve: The valve is trileaflet. No stenosis. No regurgitation.  Mitral valve: No leaflet thickening and calcification present. Trace regurgitation.  Right ventricle: Normal wall thickness and ejection fraction. Cavity is mildly dilated. Normal tricuspid annular plane systolic excursion (TAPSE).  Tricuspid valve: Trace regurgitation.  MONITORS  LONG TERM MONITOR (3-14 DAYS) 09/21/2020  Narrative The patient wore the monitor for 30 days, 2 hours starting September 04, 2020. Indication: Palpitations The  minimum heart rate was 47 bpm, maximum heart rate was 111 bpm, and average heart rate was 61 bpm. Predominant underlying rhythm was Sinus Rhythm.   Premature atrial complexes were rare less than 1%. Premature Ventricular complexes were rare less than 1%.  No ventricular tachycardia, no pauses, No AV block, and no atrial fibrillation present. 1 patient triggered event noted associated with sinus rhythm.  Conclusion: Normal/unremarkable study with no evidence of significant arrhythmia.       ______________________________________________________________________________________________      Risk Assessment/Calculations:             Physical Exam:   VS:  BP 120/80   Pulse (!) 56   Ht 5\' 11"  (1.803 m)   Wt 274 lb (124.3 kg)   SpO2 93%   BMI 38.22 kg/m    Wt Readings from Last 3 Encounters:  02/17/24 274 lb (124.3 kg)  05/06/23 270 lb (122.5 kg)  01/14/23 269 lb 3.2 oz (122.1 kg)    GEN: Well nourished, well developed in no acute distress NECK: No JVD; No carotid bruits CARDIAC: RRR, no murmurs, rubs, gallops RESPIRATORY:  Clear to auscultation without rales, wheezing or rhonchi  ABDOMEN: Soft, non-tender, non-distended EXTREMITIES:  No edema; No deformity   ASSESSMENT AND PLAN: .   CAD-s/p CABG x 4 in 2018 >> left heart cath 2020 with aspiration thrombectomy of the proximal LAD and PCI to the ostial D1 with a  DES; LIMA to LAD and SVG to OM1 and left circumflex were patent but ostially SVG to RPDA was occluded.  He does have some inversion noted in his lateral T waves on his EKG today, does have some DOE.   will arrange for Myoview.  Continue aspirin 81 mg daily, continue Plavix 75 mg daily, continue acebutelol 200 mg daily, continue Repatha.  Will try and get him on Wegovy to help prevent future MACE.  Leg weakness-he had ABIs and lower extremity arterial duplex back in 2022 related to similar symptoms, which was negative for PAD.  Symptoms are overall unchanged.  He does have  some pedal edema.  He did some research and feels that maybe his Repatha could be contributing to this although his symptoms were occurring prior to when he started Repatha.  Will have him take a holiday from his Repatha to see if this helps his symptoms.  Pedal edema-+2 pedal edema noted to mid tibia, will give him Lasix 20 mg p.o. as needed for pedal edema.  Will check BMET.  He is not showing signs of heart failure, do not think this is related to his heart, seems to be more consistent with venous insufficiency.  Encouraged compression socks.  Hypertension-his blood pressure is controlled at 120/80, continue Zestoretic 20-12.5 mg daily.    Obesity-BMI is 38.2, he does not have diabetes, working to try and get him on Wegovy for his cardiovascular disease and hopefully this will help with his obesity as well.    Dispo: Start Friona, BMET, fasting lipid panel, start Wegovy, ischemic evaluation.  Follow-up in 3 months--the purpose of the follow-up visit in 3 months will be to see if he was approved for [facility] and if he has had success with this.  Informed Consent   Shared Decision Making/Informed Consent The risks [chest pain, shortness of breath, cardiac arrhythmias, dizziness, blood pressure fluctuations, myocardial infarction, stroke/transient ischemic attack, nausea, vomiting, allergic reaction, radiation exposure, metallic taste sensation and life-threatening complications (estimated to be 1 in 10,000)], benefits (risk stratification, diagnosing coronary artery disease, treatment guidance) and alternatives of a nuclear stress test were discussed in detail with Scott Rosales and he agrees to proceed.     Signed, Flossie Dibble, NP

## 2024-02-16 NOTE — Telephone Encounter (Signed)
 Prescription sent to pharmacy.

## 2024-02-17 ENCOUNTER — Ambulatory Visit: Attending: Cardiology | Admitting: Cardiology

## 2024-02-17 ENCOUNTER — Encounter: Payer: Self-pay | Admitting: Cardiology

## 2024-02-17 VITALS — BP 120/80 | HR 56 | Ht 71.0 in | Wt 274.0 lb

## 2024-02-17 DIAGNOSIS — Z951 Presence of aortocoronary bypass graft: Secondary | ICD-10-CM | POA: Diagnosis not present

## 2024-02-17 DIAGNOSIS — R600 Localized edema: Secondary | ICD-10-CM

## 2024-02-17 DIAGNOSIS — Z789 Other specified health status: Secondary | ICD-10-CM | POA: Diagnosis not present

## 2024-02-17 DIAGNOSIS — I25118 Atherosclerotic heart disease of native coronary artery with other forms of angina pectoris: Secondary | ICD-10-CM | POA: Diagnosis not present

## 2024-02-17 DIAGNOSIS — E782 Mixed hyperlipidemia: Secondary | ICD-10-CM

## 2024-02-17 DIAGNOSIS — I10 Essential (primary) hypertension: Secondary | ICD-10-CM

## 2024-02-17 MED ORDER — SEMAGLUTIDE-WEIGHT MANAGEMENT 0.25 MG/0.5ML ~~LOC~~ SOAJ: 0.2500 mg | SUBCUTANEOUS | 0 refills | Status: AC

## 2024-02-17 MED ORDER — SEMAGLUTIDE-WEIGHT MANAGEMENT 1.7 MG/0.75ML ~~LOC~~ SOAJ: 1.7000 mg | SUBCUTANEOUS | 0 refills | Status: DC

## 2024-02-17 MED ORDER — SEMAGLUTIDE-WEIGHT MANAGEMENT 0.5 MG/0.5ML ~~LOC~~ SOAJ: 0.5000 mg | SUBCUTANEOUS | 0 refills | Status: AC

## 2024-02-17 MED ORDER — FUROSEMIDE 20 MG PO TABS: 20.0000 mg | ORAL_TABLET | ORAL | 3 refills | Status: AC | PRN

## 2024-02-17 MED ORDER — SEMAGLUTIDE-WEIGHT MANAGEMENT 2.4 MG/0.75ML ~~LOC~~ SOAJ: 2.4000 mg | SUBCUTANEOUS | 0 refills | Status: DC

## 2024-02-17 MED ORDER — SEMAGLUTIDE-WEIGHT MANAGEMENT 1 MG/0.5ML ~~LOC~~ SOAJ: 1.0000 mg | SUBCUTANEOUS | 0 refills | Status: AC

## 2024-02-17 NOTE — Patient Instructions (Signed)
 Medication Instructions:  Your physician has recommended you make the following change in your medication:   START: ZOXWRU - take as directed START: Lasix 20 mg as needed for pedal edema  *If you need a refill on your cardiac medications before your next appointment, please call your pharmacy*  Lab Work: Your physician recommends that you return for lab work in:   Labs today: BMP, Lipids  If you have labs (blood work) drawn today and your tests are completely normal, you will receive your results only by: MyChart Message (if you have MyChart) OR A paper copy in the mail If you have any lab test that is abnormal or we need to change your treatment, we will call you to review the results.  Testing/Procedures:   [facility] [address] [place], [place] [address] Phone:  [phone]    Please arrive 15 minutes prior to your appointment time for registration and insurance purposes.  The test will take approximately 3 to 4 hours to complete; you may bring reading material.  If someone comes with you to your appointment, they will need to remain in the main lobby due to limited space in the testing area. **If you are pregnant or breastfeeding, please notify the nuclear lab prior to your appointment**  How to prepare for your Myocardial Perfusion Test: Do not eat or drink 3 hours prior to your test, except you may have water. Do not consume products containing caffeine (regular or decaffeinated) 12 hours prior to your test. (ex: coffee, chocolate, sodas, tea). Do bring a list of your current medications with you.  If not listed below, you may take your medications as normal. HOLD Erectile dysfunction medication: Cialis Do wear comfortable clothes (no dresses or overalls) and walking shoes, tennis shoes preferred (No heels or open toe shoes are allowed). Do NOT wear cologne, perfume, aftershave, or lotions (deodorant is allowed). If these instructions are  not followed, your test will have to be rescheduled.  Please report to [address] for your test.  If you have questions or concerns about your appointment, you can call the [facility] at [phone].  If you cannot keep your appointment, please provide 24 hours notification to the Nuclear Lab, to avoid a possible $50 charge to your account.   Follow-Up: At [facility], you and your health needs are our priority.  As part of our continuing mission to provide you with exceptional heart care, our providers are all part of one team.  This team includes your primary Cardiologist (physician) and Advanced Practice Providers or APPs (Physician Assistants and Nurse Practitioners) who all work together to provide you with the care you need, when you need it.  Your next appointment:   3 month(s)  Provider:   Wallis Bamberg, NP Rosalita Levan)    We recommend signing up for the patient portal called "MyChart".  Sign up information is provided on this After Visit Summary.  MyChart is used to connect with patients for Virtual Visits (Telemedicine).  Patients are able to view lab/test results, encounter notes, upcoming appointments, etc.  Non-urgent messages can be sent to your provider as well.   To learn more about what you can do with MyChart, go to [email or web address].   Other Instructions None

## 2024-02-18 ENCOUNTER — Telehealth: Payer: Self-pay

## 2024-02-18 ENCOUNTER — Telehealth: Payer: Self-pay | Admitting: Pharmacy Technician

## 2024-02-18 LAB — BASIC METABOLIC PANEL WITH GFR
BUN/Creatinine Ratio: 15 (ref 10–24)
BUN: 17 mg/dL (ref 8–27)
CO2: 24 mmol/L (ref 20–29)
Calcium: 9.4 mg/dL (ref 8.6–10.2)
Chloride: 103 mmol/L (ref 96–106)
Creatinine, Ser: 1.15 mg/dL (ref 0.76–1.27)
Glucose: 80 mg/dL (ref 70–99)
Potassium: 4.8 mmol/L (ref 3.5–5.2)
Sodium: 141 mmol/L (ref 134–144)
eGFR: 72 mL/min/1.73

## 2024-02-18 LAB — LIPID PANEL
Chol/HDL Ratio: 2.5 ratio (ref 0.0–5.0)
Cholesterol, Total: 139 mg/dL (ref 100–199)
HDL: 56 mg/dL
LDL Chol Calc (NIH): 59 mg/dL (ref 0–99)
Triglycerides: 141 mg/dL (ref 0–149)
VLDL Cholesterol Cal: 24 mg/dL (ref 5–40)

## 2024-02-18 NOTE — Telephone Encounter (Signed)
 Pharmacy Patient Advocate Encounter   Received notification from Pt Calls Messages that prior authorization for wegovy is required/requested.   Insurance verification completed.   The patient is insured through [facility] .   Per test claim: PA required; PA submitted to above mentioned insurance via CoverMyMeds Key/confirmation #/EOC [facility] Status is pending

## 2024-02-18 NOTE — Telephone Encounter (Signed)
 Fax received from [facility] in [place] stating patient needed a prior authorization completed for [facility].  I will route to our prior auth team for completion.

## 2024-02-20 NOTE — Telephone Encounter (Signed)
 Pharmacy Patient Advocate Encounter  Received notification from [facility] that Prior Authorization for wegovy has been DENIED.  See denial reason below. No denial letter attached in CMM. Will attach denial letter to Media tab once received.   PA #/Case ID/Reference #: [id number]

## 2024-02-20 NOTE — Addendum Note (Signed)
 Addended by: Lonia Farber on: 02/20/2024 07:10 AM   Modules accepted: Orders

## 2024-02-24 ENCOUNTER — Encounter (HOSPITAL_BASED_OUTPATIENT_CLINIC_OR_DEPARTMENT_OTHER): Payer: Self-pay

## 2024-02-24 ENCOUNTER — Ambulatory Visit: Attending: Cardiology

## 2024-02-24 DIAGNOSIS — I25118 Atherosclerotic heart disease of native coronary artery with other forms of angina pectoris: Secondary | ICD-10-CM | POA: Diagnosis not present

## 2024-02-24 DIAGNOSIS — E782 Mixed hyperlipidemia: Secondary | ICD-10-CM | POA: Diagnosis not present

## 2024-02-24 DIAGNOSIS — Z789 Other specified health status: Secondary | ICD-10-CM

## 2024-02-24 DIAGNOSIS — Z951 Presence of aortocoronary bypass graft: Secondary | ICD-10-CM

## 2024-02-24 DIAGNOSIS — I10 Essential (primary) hypertension: Secondary | ICD-10-CM

## 2024-02-24 LAB — MYOCARDIAL PERFUSION IMAGING
LV dias vol: 132 mL (ref 62–150)
LV sys vol: 50 mL
Nuc Stress EF: 62 %
Peak HR: 69 {beats}/min
Rest HR: 50 {beats}/min
Rest Nuclear Isotope Dose: 10.9 mCi
SDS: 6
SRS: 2
SSS: 8
Stress Nuclear Isotope Dose: 30.2 mCi
TID: 0.87

## 2024-02-24 MED ORDER — REGADENOSON 0.4 MG/5ML IV SOLN
0.4000 mg | Freq: Once | INTRAVENOUS | Status: AC
  Administered 2024-02-24: 0.4 mg via INTRAVENOUS

## 2024-02-24 MED ORDER — TECHNETIUM TC 99M TETROFOSMIN IV KIT
10.9000 | PACK | Freq: Once | INTRAVENOUS | Status: AC | PRN
  Administered 2024-02-24: 10.9 via INTRAVENOUS

## 2024-02-24 MED ORDER — TECHNETIUM TC 99M TETROFOSMIN IV KIT
30.2000 | PACK | Freq: Once | INTRAVENOUS | Status: AC | PRN
  Administered 2024-02-24: 30.2 via INTRAVENOUS

## 2024-03-02 NOTE — Progress Notes (Unsigned)
 Cardiology Office Note:  .   Date:  03/04/2024  ID:  Althea Atkinson, DOB November 25, 1960, MRN [id number] PCP: Rosary Como, MD  [facility] Providers Cardiologist:  Zoe Hinds, MD    History of Present Illness: .   Casen Jesiel Garate is a 63 y.o. male with a past medical history of CAD s/p CABG x 4 & PCI with DES 2020, hypertension, PVCs, junctional rhythm, CKD, GERD, dyslipidemia.  02/24/2024 Lexiscan  findings consistent with ischemia, intermediate risk, medium defect with moderate reduction in uptake present in the anterior location that is reversible 05/06/2022 echo EF 60 to 65%, mild LVH, indeterminate diastolic parameters 06/12/2021 LEA no evidence of stenosis 06/12/2021 ABI normal 10/05/2020 monitor average heart rate 61 bpm, predominant rhythm was sinus, overall unremarkable 08/12/2019 cardiac cath severe three-vessel CAD with successful aspiration thrombectomy of the proximal LAD and PCI to the ostial D1 with a DES; LIMA to LAD and SVG to OM1 and left circumflex were patent but ostially SVG to RPDA was occluded 2018 CABG x 4 LIMA to LAD, SVG to OM1 and distal circumflex, SVG to PDA  Most recently was evaluated by Dr. Sandee Crook on 05/06/2023, he was stable from a cardiac perspective, was undergoing radiation for prostate cancer and dealing with fatigue but overall stable, and he was advised to follow-up in 6 months.  Evaluated by myself on 02/17/2024 for follow-up of his CAD, he continued to be plagued by a weak sensation in his legs, had previously had ABIs and LEAs which were normal.  He has some complaints of DOE, some symptoms that were hard to decipher and we decided to proceed with an ischemic evaluation which completed on 02/24/2024 revealing ischemia, intermediate risk.  Also tried to get him approved for Wegovy  to help prevent further M ACE however it appears that his insurance will not cover this.  He presents today accompanied by his daughter for follow-up after abnormal  ischemic evaluation.  We discussed his symptoms in further detail, he is relatively asymptomatic, he plays golf at least once a week, does ride the golf cart but also walks a considerable amount.  He is a Optician, dispensing, he stays very busy, his daughter states he does not know how to relax and take care of his self.  We discussed the events leading up to his left heart cath in 2020, he was having episodes of chest pain at that time, he has not had any further episodes since then, has not needed his nitroglycerin .  He denies chest pain, palpitations, dyspnea, pnd, orthopnea, n, v, dizziness, syncope, edema, weight gain, or early satiety.   ROS: Review of Systems  Cardiovascular:  Positive for leg swelling.  Neurological:  Positive for weakness (legs feel weak).  All other systems reviewed and are negative.    Studies Reviewed: .        Cardiac Studies & Procedures   ______________________________________________________________________________________________ CARDIAC CATHETERIZATION  CARDIAC CATHETERIZATION 08/12/2019  Conclusion Conclusions: 1. Severe three-vessel coronary artery disease, as detailed below.  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 2. Widely patent LIMA-LAD and SVG-OM1-LCx. 3. Ostially occluded SVG-rPDA; this appears chronic. 4. Normal left ventricular systolic function. 5. Mildly elevated LVEDP. 6. Successful aspiration thrombectomy of the proximal LAD and PCI to ostial D1 using a Resolute Onyx 2.5 x 12 mm drug-eluting stent.  There is a small amount of residual thrombus in the proximal  LAD and 0% residual stenosis with TIMI-3 flow in D1.  Recommendations: 1. Continue tirofiban  infusion for 24 hours. 2. Indefinite dual antiplatelet therapy with aspirin  and prasugrel . 3. Aggressive secondary prevention, including continued alirocumab   use.  Sammy Crisp, MD [facility] Pager: [phone]  Findings Coronary Findings Diagnostic  Dominance: Right  Left Main Vessel is large. 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  Left Anterior Descending The vessel has an aneurysm. Ost LAD to Prox LAD lesion is 70% stenosed. The lesion is heavily thrombotic. Prox LAD lesion is 100% stenosed. Mid LAD lesion is 50% stenosed.  First Diagonal Branch Vessel is moderate in size. Ost 1st Diag lesion is 90% stenosed.  First Septal Branch Vessel is small in size.  Second Septal Branch Vessel is small in size.  Third Diagonal Branch Vessel is small in size.  Left Circumflex Vessel is large. Mid Cx lesion is 90% stenosed. The lesion is eccentric, irregular, thrombotic and ulcerative.  Second Obtuse Marginal Branch Vessel is large in size. This is actually OM1 Ost 2nd Mrg lesion is 95% stenosed. The lesion is located at the bifurcation, eccentric, irregular, thrombotic and ulcerative.  Left Posterior Atrioventricular Artery Vessel is small in size.  Right Coronary Artery Vessel is large. There is mild diffuse disease throughout the vessel. There is mild focal disease in the vessel. The vessel is moderately tortuous.  Right Posterior Descending Artery Ost RPDA lesion is 100% stenosed. The lesion is discrete with left-to-right collateral flow. Likely subacute occlusion  Right Posterior Atrioventricular Artery Vessel is moderate in size.  First Right Posterolateral Branch Vessel is small in size.  Saphenous Graft To Ost RPDA SVG graft was visualized by angiography. Origin lesion is 100% stenosed. The lesion is chronically occluded.  Sequential Saphenous Graft To 2nd Mrg, Ost 3rd Mrg SVG graft was visualized by angiography and is normal in caliber. The graft exhibits no disease.  LIMA LIMA Graft To Mid LAD LIMA graft was visualized by angiography and is large. The graft exhibits no disease.  Intervention  Ost LAD to  Prox LAD lesion Thrombectomy Aspiration thrombectomy performed using a CATH EXTRAC PRONTO 5.36F 138CM. Post-Intervention Lesion Assessment The intervention was successful. Pre-interventional TIMI flow is 3. Post-intervention TIMI flow is 3. No complications occurred at this lesion. There is a 20% residual stenosis post intervention.  Ost 1st Diag lesion Stent A drug-eluting stent was successfully placed using a STENT RESOLUTE ONYX 2.5X12. Post-Intervention Lesion Assessment The intervention was successful. Pre-interventional TIMI flow is 2. Post-intervention TIMI flow is 3. No complications occurred at this lesion. There is a 0% residual stenosis post intervention.   CARDIAC CATHETERIZATION  CARDIAC CATHETERIZATION 10/12/2017  Conclusion Images from the original result were not included.   Ost RPDA lesion is 100% stenosed.  Bifurcation Cx-OM1 Ost 1st Mrg lesion is 95% stenosed. Mid Cx lesion is 90% stenosed.  Prox LAD lesion is 55% stenosed. Mid LAD lesion is 70% stenosed. FFR 0.79.  The left ventricular systolic function is normal. The left ventricular ejection fraction is greater than 65% by visual estimate.  LV end diastolic pressure is normal.   SEVERE 3-4 VESSEL CAD: 100% (Subacute) ostRPDA, 90% mCx @ OM1 with 95% OM1 (bifurcation 0,1,1) lesions (hazy, irregular/thrombotic), diffuse mLAD ~70% lesion (FFR 0.79).  Mild to moderate disease throughout the RCA with RPL.  Ectatic, aneurysmal left main, ostial-proximal LAD dilation -not optimal for guide placement.  Well-preserved LVEF with mid inferior wall hypokinesis but otherwise no significant wall motion and normalities.  Moderately elevated LVEDP  PLAN OF CARE: Return to nursing unit for ongoing care.  TR band removal per protocol  The patient has three-vessel disease.  We are in the process of calling CV TS for CABG consult  I will start IV Aggrastat  now and restart heparin  in 8 hours given the ulcerated thrombotic   appearance of the circumflex bifurcation lesion and is ongoing pain.  We will start low-dose beta-blocker and high-dose statin  He will had to return back to his nursing unit because there are no stepdown or ICU beds available --he is currently chest pain-free     Randene Bustard, M.D., M.S. Interventional Cardiologist  Pager # [phone] Phone # [phone] [address] [place], [place] [address]  Findings Coronary Findings Diagnostic  Dominance: Right  Left Main Vessel is large.  Left Anterior Descending Prox LAD lesion is 55% stenosed. Mid LAD lesion is 70% stenosed. Pressure wire/FFR was performed on the lesion. FFR: 0.79.  First Diagonal Branch Vessel is moderate in size.  First Septal Branch Vessel is small in size.  Second Septal Branch Vessel is small in size.  Third Diagonal Branch Vessel is small in size.  Left Circumflex Vessel is large. Mid Cx lesion is 90% stenosed. The lesion is eccentric, irregular, thrombotic and ulcerative.  Second Obtuse Marginal Branch Vessel is large in size. This is actually OM1 Ost 2nd Mrg lesion is 95% stenosed. The lesion is located at the bifurcation, eccentric, irregular, thrombotic and ulcerative.  Left Posterior Atrioventricular Artery Vessel is small in size.  Right Coronary Artery Vessel is large. There is mild diffuse disease throughout the vessel. There is mild focal disease in the vessel. The vessel is moderately tortuous.  Right Posterior Descending Artery Ost RPDA lesion is 100% stenosed. The lesion is discrete with left-to-right collateral flow. Likely subacute occlusion  Right Posterior Atrioventricular Artery Vessel is moderate in size.  First Right Posterolateral Branch Vessel is small in size.  Intervention  No interventions have been documented.   STRESS TESTS  MYOCARDIAL PERFUSION IMAGING 02/24/2024  Narrative   Findings are consistent with ischemia. The study is  intermediate risk.   LV perfusion is abnormal. Defect 1: There is a medium defect with moderate reduction in uptake present in the anterior location(s) that is reversible. There is normal wall motion in the defect area. Consistent with ischemia.   Left ventricular function is normal. Nuclear stress EF: 62%. The left ventricular ejection fraction is normal (55-65%). End diastolic cavity size is normal.   Prior study available for comparison from 10/21/2018.   ECHOCARDIOGRAM  ECHOCARDIOGRAM COMPLETE 05/06/2022  Narrative ECHOCARDIOGRAM REPORT    Patient Name:   TADAN SHILL Date of Exam: 05/06/2022 Medical Rec #:  [id number]         Height:       71.0 in Accession #:    [id number]        Weight:       269.0 lb Date of Birth:  09/12/1961        BSA:          2.392 m Patient Age:    60 years          BP:           153/93 mmHg Patient Gender: M                 HR:           58 bpm. Exam Location:  [place]  Procedure: 2D Echo, Cardiac Doppler, Color  Doppler and Strain Analysis  Indications:    Coronary artery disease of native artery of native heart with stable angina pectoris (HCC) [I25.118 (ICD-10-CM)]; Mixed hyperlipidemia [E78.2 (ICD-10-CM)];  History:        Patient has prior history of Echocardiogram examinations, most recent 08/13/2019.  Sonographer:    Kristen Petri RDCS Referring Phys: [id number] BRIAN J MUNLEY  IMPRESSIONS   1. Left ventricular ejection fraction, by estimation, is 60 to 65%. The left ventricle has normal function. The left ventricle has no regional wall motion abnormalities. There is mild left ventricular hypertrophy. Left ventricular diastolic parameters are indeterminate. 2. Right ventricular systolic function is normal. The right ventricular size is normal. There is normal pulmonary artery systolic pressure. 3. The mitral valve is normal in structure. No evidence of mitral valve regurgitation. No evidence of mitral stenosis. 4. The aortic valve is normal  in structure. Aortic valve regurgitation is not visualized. No aortic stenosis is present. 5. The inferior vena cava is normal in size with greater than 50% respiratory variability, suggesting right atrial pressure of 3 mmHg.  FINDINGS Left Ventricle: Left ventricular ejection fraction, by estimation, is 60 to 65%. The left ventricle has normal function. The left ventricle has no regional wall motion abnormalities. The left ventricular internal cavity size was normal in size. There is mild left ventricular hypertrophy. Left ventricular diastolic parameters are indeterminate.  Right Ventricle: The right ventricular size is normal. No increase in right ventricular wall thickness. Right ventricular systolic function is normal. There is normal pulmonary artery systolic pressure. The tricuspid regurgitant velocity is 2.39 m/s, and with an assumed right atrial pressure of 3 mmHg, the estimated right ventricular systolic pressure is 25.8 mmHg.  Left Atrium: Left atrial size was normal in size.  Right Atrium: Right atrial size was normal in size.  Pericardium: There is no evidence of pericardial effusion.  Mitral Valve: The mitral valve is normal in structure. No evidence of mitral valve regurgitation. No evidence of mitral valve stenosis.  Tricuspid Valve: The tricuspid valve is normal in structure. Tricuspid valve regurgitation is mild . No evidence of tricuspid stenosis.  Aortic Valve: The aortic valve is normal in structure. Aortic valve regurgitation is not visualized. No aortic stenosis is present.  Pulmonic Valve: The pulmonic valve was normal in structure. Pulmonic valve regurgitation is not visualized. No evidence of pulmonic stenosis.  Aorta: The aortic root is normal in size and structure.  Venous: The inferior vena cava is normal in size with greater than 50% respiratory variability, suggesting right atrial pressure of 3 mmHg.  IAS/Shunts: No atrial level shunt detected by color flow  Doppler.   LEFT VENTRICLE PLAX 2D LVIDd:         5.10 cm   Diastology LVIDs:         3.50 cm   LV e' medial:    9.46 cm/s LV PW:         1.60 cm   LV E/e' medial:  11.4 LV IVS:        1.60 cm   LV e' lateral:   10.65 cm/s LVOT diam:     2.10 cm   LV E/e' lateral: 10.1 LV SV:         95 LV SV Index:   40 LVOT Area:     3.46 cm   RIGHT VENTRICLE             IVC RV S prime:     10.70 cm/s  IVC diam: 1.60  cm TAPSE (M-mode): 2.2 cm  LEFT ATRIUM             Index        RIGHT ATRIUM           Index LA diam:        4.20 cm 1.76 cm/m   RA Area:     14.90 cm LA Vol (A2C):   81.1 ml 33.91 ml/m  RA Volume:   37.90 ml  15.85 ml/m LA Vol (A4C):   62.4 ml 26.09 ml/m LA Biplane Vol: 75.0 ml 31.36 ml/m AORTIC VALVE             PULMONIC VALVE LVOT Vmax:   113.00 cm/s PR End Diast Vel: 4.75 msec LVOT Vmean:  85.900 cm/s LVOT VTI:    0.274 m  AORTA Ao Root diam: 3.90 cm Ao Asc diam:  3.70 cm Ao Desc diam: 2.70 cm  MITRAL VALVE                TRICUSPID VALVE MV Area (PHT): 4.19 cm     TR Peak grad:   22.8 mmHg MV Decel Time: 181 msec     TR Vmax:        239.00 cm/s MV E velocity: 108.00 cm/s MV A velocity: 94.30 cm/s   SHUNTS MV E/A ratio:  1.15         Systemic VTI:  0.27 m Systemic Diam: 2.10 cm  Ralene Burger MD Electronically signed by Ralene Burger MD Signature Date/Time: 05/06/2022/4:34:13 PM    Final   TEE  ECHO TEE 10/13/2017  Interpretation Summary  Septum: No Patent Foramen Ovale present.  Left atrium: Patent foramen ovale not present.  Left atrium: No spontaneous echo contrast.  Aortic valve: The valve is trileaflet. No stenosis. No regurgitation.  Mitral valve: No leaflet thickening and calcification present. Trace regurgitation.  Right ventricle: Normal wall thickness and ejection fraction. Cavity is mildly dilated. Normal tricuspid annular plane systolic excursion (TAPSE).  Tricuspid valve: Trace regurgitation.  MONITORS  LONG TERM MONITOR  (3-14 DAYS) 09/21/2020  Narrative The patient wore the monitor for 30 days, 2 hours starting September 04, 2020. Indication: Palpitations The minimum heart rate was 47 bpm, maximum heart rate was 111 bpm, and average heart rate was 61 bpm. Predominant underlying rhythm was Sinus Rhythm.   Premature atrial complexes were rare less than 1%. Premature Ventricular complexes were rare less than 1%.  No ventricular tachycardia, no pauses, No AV block, and no atrial fibrillation present. 1 patient triggered event noted associated with sinus rhythm.  Conclusion: Normal/unremarkable study with no evidence of significant arrhythmia.       ______________________________________________________________________________________________      Risk Assessment/Calculations:         STOP-Bang Score:  8      Physical Exam:   VS:  BP 112/84   Pulse (!) 54   Ht 5\' 11"  (1.803 m)   Wt 271 lb (122.9 kg)   SpO2 96%   BMI 37.80 kg/m    Wt Readings from Last 3 Encounters:  03/04/24 271 lb (122.9 kg)  02/24/24 275 lb (124.7 kg)  02/17/24 274 lb (124.3 kg)    GEN: Well nourished, well developed in no acute distress NECK: No JVD; No carotid bruits CARDIAC: RRR, no murmurs, rubs, gallops RESPIRATORY:  Clear to auscultation without rales, wheezing or rhonchi  ABDOMEN: Soft, non-tender, non-distended EXTREMITIES: Trace pedal edema; No deformity   ASSESSMENT AND PLAN: .   CAD-s/p CABG x  4 in 2018 >> left heart cath 2020 with aspiration thrombectomy of the proximal LAD and PCI to the ostial D1 with a DES; LIMA to LAD and SVG to OM1 and left circumflex were patent but ostially SVG to RPDA was occluded >> Lexiscan  02/2024 revealed ischemia, intermediate risk.  He is relatively asymptomatic today, does have some symptoms that are hard for him to express, just does not feel great overall.  Will start him on low-dose isosorbide  and see if this improves how he feels with plans to contact him in a few weeks to see  how his symptoms are.  Continue aspirin  81 mg daily, continue Plavix  75 mg daily, continue acebutelol 200 mg daily, continue Repatha .  Previously tried to get him approved for Wegovy , but it appears his insurance will not cover this.    Leg weakness-he had ABIs and lower extremity arterial duplex back in 2022 related to similar symptoms, which was negative for PAD.  Symptoms are overall unchanged.  He does have some pedal edema.  He did some research and feels that maybe his Repatha  could be contributing to this although his symptoms were occurring prior to when he started Repatha , we discussed a holiday to see if his symptoms improve at last OV. He apparently had a biopsy some years ago for this as well, which was unrevealing. I will ask Dr. Loetta Ringer if there is any indication for vascular imaging.   Pedal edema-trace pedal edema, he has Lasix  as needed, encouraged compression socks.  Hypertension-his blood pressure is controlled at 120/80, continue Zestoretic 20-12.5 mg daily.  Snoring-STOP-BANG score of 8, will arrange for an at home Itamar study.     Obesity-BMI is 37.8, he does not have diabetes, unfortunately his insurance will not approve Wegovy .  He has been making dietary changes and I encouraged him to be purposely working out.    Dispo: Start Imdur  30 mg daily, at home Itamar for OSA evaluation, follow-up in 6 months but we will touch base with him in a few weeks to see if his symptoms have improved.    Signed, Terrance Ferretti, NP

## 2024-03-04 ENCOUNTER — Ambulatory Visit: Attending: Cardiology | Admitting: Cardiology

## 2024-03-04 ENCOUNTER — Encounter: Payer: Self-pay | Admitting: Cardiology

## 2024-03-04 ENCOUNTER — Telehealth: Payer: Self-pay

## 2024-03-04 VITALS — BP 112/84 | HR 54 | Ht 71.0 in | Wt 271.0 lb

## 2024-03-04 DIAGNOSIS — E782 Mixed hyperlipidemia: Secondary | ICD-10-CM | POA: Diagnosis not present

## 2024-03-04 DIAGNOSIS — R0683 Snoring: Secondary | ICD-10-CM | POA: Diagnosis not present

## 2024-03-04 DIAGNOSIS — I25118 Atherosclerotic heart disease of native coronary artery with other forms of angina pectoris: Secondary | ICD-10-CM

## 2024-03-04 DIAGNOSIS — E66812 Obesity, class 2: Secondary | ICD-10-CM

## 2024-03-04 DIAGNOSIS — I10 Essential (primary) hypertension: Secondary | ICD-10-CM

## 2024-03-04 DIAGNOSIS — Z6837 Body mass index (BMI) 37.0-37.9, adult: Secondary | ICD-10-CM

## 2024-03-04 DIAGNOSIS — Z951 Presence of aortocoronary bypass graft: Secondary | ICD-10-CM | POA: Diagnosis not present

## 2024-03-04 DIAGNOSIS — E6609 Other obesity due to excess calories: Secondary | ICD-10-CM

## 2024-03-04 DIAGNOSIS — R29898 Other symptoms and signs involving the musculoskeletal system: Secondary | ICD-10-CM

## 2024-03-04 MED ORDER — ISOSORBIDE MONONITRATE ER 30 MG PO TB24: 30.0000 mg | ORAL_TABLET | Freq: Every day | ORAL | 3 refills | Status: AC

## 2024-03-04 NOTE — Telephone Encounter (Signed)
 Patient agreement reviewed and signed on 03/04/2024.  WatchPAT issued to patient on 03/04/2024 by Dondra Fuel, RN. Patient aware to not open the WatchPAT box until contacted with the activation PIN. Patient profile initialized in CloudPAT on 03/04/2024 by Dondra Fuel RN. Device serial number: [id number]  Please list Reason for Call as Advice Only and type "WatchPAT issued to patient" in the comment box.

## 2024-03-04 NOTE — Patient Instructions (Signed)
 Medication Instructions:  Your physician has recommended you make the following change in your medication:   START: Imdur  30 mg daily  *If you need a refill on your cardiac medications before your next appointment, please call your pharmacy*  Lab Work: None If you have labs (blood work) drawn today and your tests are completely normal, you will receive your results only by: MyChart Message (if you have MyChart) OR A paper copy in the mail If you have any lab test that is abnormal or we need to change your treatment, we will call you to review the results.  Testing/Procedures: Itamar Sleep Study  Follow-Up: At [facility], you and your health needs are our priority.  As part of our continuing mission to provide you with exceptional heart care, our providers are all part of one team.  This team includes your primary Cardiologist (physician) and Advanced Practice Providers or APPs (Physician Assistants and Nurse Practitioners) who all work together to provide you with the care you need, when you need it.  Your next appointment:   6 month(s)  Provider:   Pattricia Bores, NP Georgeana Kindler)    We recommend signing up for the patient portal called "MyChart".  Sign up information is provided on this After Visit Summary.  MyChart is used to connect with patients for Virtual Visits (Telemedicine).  Patients are able to view lab/test results, encounter notes, upcoming appointments, etc.  Non-urgent messages can be sent to your provider as well.   To learn more about what you can do with MyChart, go to [email or web address].   Other Instructions None

## 2024-03-23 ENCOUNTER — Telehealth: Payer: Self-pay | Admitting: Emergency Medicine

## 2024-03-23 NOTE — Telephone Encounter (Signed)
 Called and spoke to patient.    Patient reports that taking Imdur  made him feel like a zombie. He reports that he is not taking it anymore due to the symptoms he was feeling.   Patient reports that has no symptoms at this time and feels great.   Informed patient that I would notify Pattricia Bores of this.  He verbalized understanding and had no further questions.

## 2024-03-23 NOTE — Telephone Encounter (Signed)
 Ordering provider: Pattricia Bores, NP Associated diagnoses: I21.4, I25.10, I49.3 WatchPAT PA obtained on 03/23/2024 by Maceo Sax, LPN. Authorization: Yes; tracking ID [id number] Patient notified of PIN (1234) on 03/23/2024 via Notification Method: phone.

## 2024-03-23 NOTE — Telephone Encounter (Signed)
-----   Message from Terrance Ferretti sent at 03/22/2024 10:46 AM EDT ----- Can we see how he is feeling after starting his Imdur  please? ----- Message ----- From: Terrance Ferretti, NP Sent: 03/18/2024   8:00 AM EDT To: Terrance Ferretti, NP  Call him to see how he is after imdur

## 2024-03-23 NOTE — Telephone Encounter (Signed)
 Scott Rosales made aware and no changes at this time.

## 2024-03-23 NOTE — Telephone Encounter (Signed)
Please approve this  

## 2024-03-24 ENCOUNTER — Encounter (INDEPENDENT_AMBULATORY_CARE_PROVIDER_SITE_OTHER): Payer: Self-pay | Admitting: Cardiology

## 2024-03-24 DIAGNOSIS — R0683 Snoring: Secondary | ICD-10-CM | POA: Diagnosis not present

## 2024-03-24 NOTE — Telephone Encounter (Signed)
 Called the patient and gave him the phone number on the WatchPat website to call for them to help him trouble shoot his device. Patient verbalized understanding and had no further questions at this time.

## 2024-03-24 NOTE — Telephone Encounter (Signed)
 Called the patient and he explained that the company told him to call the office and have them register the device. I explained to the patient that when I tried to register the device again I received a message that the patient was already in the [employer] system. I explained that I would send a message to Enedina Harrow LPN to see if she could help troubleshoot the device. Patient verbalized understanding and had no further questions at this time.

## 2024-03-24 NOTE — Telephone Encounter (Signed)
 Called patient and confirmed device serial number. Patient registered in Cloudpat but not device. Device is no registered and patient instructed to attempt test completion tonight. Patient will call sleep coordinator if he has any other issues, he will reach ou to sleep coordinator.

## 2024-03-24 NOTE — Telephone Encounter (Signed)
 Patient called stating company told him we need to register the device order for it to work.  He spoke to them and they walked him through the steps.

## 2024-03-24 NOTE — Telephone Encounter (Signed)
 Called the patient and informed him that his sleep study had been approved and he could take the test tonight. He stated that he had been called yesterday and they walked him through performing the test. When he put the battery into the sleep study and tried to pair the device it gave him an error message. He could see that it was paired but the watch part of the sleep study gave him an error message. I explained that I would check into this and get back with him.

## 2024-03-25 ENCOUNTER — Ambulatory Visit: Attending: Cardiology

## 2024-03-25 DIAGNOSIS — E782 Mixed hyperlipidemia: Secondary | ICD-10-CM

## 2024-03-25 DIAGNOSIS — I25118 Atherosclerotic heart disease of native coronary artery with other forms of angina pectoris: Secondary | ICD-10-CM

## 2024-03-25 DIAGNOSIS — Z951 Presence of aortocoronary bypass graft: Secondary | ICD-10-CM

## 2024-03-25 NOTE — Procedures (Signed)
   SLEEP STUDY REPORT Patient Information Study Date: 03/24/2024 Patient Name: Scott Rosales Patient ID: [id number] Birth Date: [birth year] Age: 63 Gender: BMI: 38.0 (W=271 lb, H=5' 11'') Referring Physician: Pattricia Bores, NP  TEST DESCRIPTION: Home sleep apnea testing was completed using the WatchPat, a Type 1 device, utilizing peripheral arterial tonometry (PAT), chest movement, actigraphy, pulse oximetry, pulse rate, body position and snore. AHI was calculated with apnea and hypopnea using valid sleep time as the denominator. RDI includes apneas, hypopneas, and RERAs. The data acquired and the scoring of sleep and all associated events were performed in accordance with the recommended standards and specifications as outlined in the AASM Manual for the Scoring of Sleep and Associated Events 2.2.0 (2015).  FINDINGS: 1. No evidence of Obstructive Sleep Apnea with AHI 1.5/hr. 2. No Central Sleep Apnea. 3. Oxygen desaturations as low as 88%. 4. Mild snoring was present. O2 sats were < 88% for 0 minutes. 5. Total sleep time was 5 hrs and 53 min. 6. 29.8% of total sleep time was spent in REM sleep. 7. Normal sleep onset latency at 16 min. 8. Shortened REM sleep onset latency at 66 min. 9. Total awakenings were 7.  DIAGNOSIS: Normal study with no significant sleep disordered breathing. RECOMMENDATIONS: 1. Normal study with no significant sleep disordered breathing. 2. Healthy sleep recommendations include: adequate nightly sleep (normal 7-9 hrs/night), avoidance of caffeine after noon and alcohol near bedtime, and maintaining a sleep environment that is cool, dark and quiet. 3. Weight loss for overweight patients is recommended. 4. Snoring recommendations include: weight loss where appropriate, side sleeping, and avoidance of alcohol before bed. 5. Operation of motor vehicle or dangerous equipment must be avoided when feeling drowsy, excessively sleepy, or mentally fatigued. 6. An  ENT consultation which may be useful for specific causes of and possible treatment of bothersome snoring . 7. Weight loss may be of benefit in reducing the severity of snoring.   Signature: Gaylyn Keas, MD; [facility]; Diplomat, American Board of Sleep Medicine Electronically Signed: 03/25/2024 5:52:35 PM

## 2024-04-01 ENCOUNTER — Telehealth: Payer: Self-pay

## 2024-04-01 NOTE — Telephone Encounter (Signed)
Notified patient of sleep study results and recommendations. All questions (if any) were answered and patient verbalized understanding.

## 2024-04-01 NOTE — Telephone Encounter (Signed)
-----   Message from Gaylyn Keas sent at 03/25/2024  5:53 PM EDT ----- Please let patient know that sleep study showed no significant sleep apnea.

## 2024-04-04 ENCOUNTER — Other Ambulatory Visit: Payer: Self-pay | Admitting: Cardiology

## 2024-04-04 DIAGNOSIS — E782 Mixed hyperlipidemia: Secondary | ICD-10-CM

## 2024-04-04 DIAGNOSIS — I214 Non-ST elevation (NSTEMI) myocardial infarction: Secondary | ICD-10-CM

## 2024-04-04 DIAGNOSIS — I25118 Atherosclerotic heart disease of native coronary artery with other forms of angina pectoris: Secondary | ICD-10-CM

## 2024-04-18 ENCOUNTER — Other Ambulatory Visit: Payer: Self-pay | Admitting: Cardiology

## 2024-05-10 ENCOUNTER — Other Ambulatory Visit: Payer: Self-pay | Admitting: Cardiology

## 2024-05-10 DIAGNOSIS — I493 Ventricular premature depolarization: Secondary | ICD-10-CM

## 2024-05-10 DIAGNOSIS — I25118 Atherosclerotic heart disease of native coronary artery with other forms of angina pectoris: Secondary | ICD-10-CM

## 2024-05-17 NOTE — Progress Notes (Signed)
 Cardiology Office Note:  .   Date:  05/18/2024  ID:  Scott Rosales, DOB 05-28-1961, MRN [id number] PCP: Lajoyce Bruckner, MD  [facility] Providers Cardiologist:  Redell Leiter, MD Cardiology APP:  Carlin Delon BROCKS, NP    History of Present Illness: .   Scott Rosales is a 63 y.o. male with a past medical history of CAD s/p CABG x 4 & PCI with DES 2020, hypertension, PVCs, junctional rhythm, CKD, GERD, dyslipidemia.  02/24/2024 Lexiscan  findings consistent with ischemia, intermediate risk, medium defect with moderate reduction in uptake present in the anterior location that is reversible 05/06/2022 echo EF 60 to 65%, mild LVH, indeterminate diastolic parameters 06/12/2021 LEA no evidence of stenosis 06/12/2021 ABI normal 10/05/2020 monitor average heart rate 61 bpm, predominant rhythm was sinus, overall unremarkable 08/12/2019 cardiac cath severe three-vessel CAD with successful aspiration thrombectomy of the proximal LAD and PCI to the ostial D1 with a DES; LIMA to LAD and SVG to OM1 and left circumflex were patent but ostially SVG to RPDA was occluded 2018 CABG x 4 LIMA to LAD, SVG to OM1 and distal circumflex, SVG to PDA  In 2018 he underwent CABG x 4  >> in 2020 he underwent left heart cath s/p aspiration thrombectomy of the proximal LAD and PCI of the ostial D1 with DES x 1.  Evaluated by Dr. Leiter on 05/06/2023, he was stable from a cardiac perspective, was undergoing radiation for prostate cancer and dealing with fatigue but overall stable, and he was advised to follow-up in 6 months.  Evaluated by myself on 02/17/2024 for follow-up of his CAD, he continued to be plagued by a weak sensation in his legs, had previously had ABIs and LEAs which were normal.  He has some complaints of DOE, some symptoms that were hard to decipher and we decided to proceed with an ischemic evaluation which completed on 02/24/2024 revealing ischemia, intermediate risk.  Also tried to get him approved  for Wegovy  to help prevent further M ACE however it appears that his insurance will not cover this.  Most recently evaluated by myself on 03/04/2024 to discuss his abnormal ischemic evaluation, he was relatively asymptomatic playing golf weekly, active as a Optician, dispensing at [employer], he was not having any episodes of angina consistent with his prior CABG or stent placement, ultimately we decided to start him on low-dose Imdur  with plans for quick follow-up--nursing staff reach out to him a few weeks later and he advised he was feeling well.  He presents today for follow-up of CAD.  He is not been bothered by episodes of chest pain or angina, we did start him on Imdur  however he has subsequently self discontinued and feeling well.  He is also self discontinued his Neurontin thinking this might be causing issues with his ongoing leg pain and he is feeling somewhat better.  He is trying to be more physically active, this is difficult with his busy schedule however he did just have a weekend at the beach playing golf and tolerated this without any adverse effects.  He has some pedal edema, has been taking his Lasix  intermittently.  He denies chest pain, palpitations, dyspnea, pnd, orthopnea, n, v, dizziness, syncope,  weight gain, or early satiety.     ROS: Review of Systems  Cardiovascular:  Positive for leg swelling.  Neurological:  Positive for weakness (legs feel weak).  All other systems reviewed and are negative.    Studies Reviewed: .  Cardiac Studies & Procedures   ______________________________________________________________________________________________ CARDIAC CATHETERIZATION  CARDIAC CATHETERIZATION 08/12/2019  Conclusion Conclusions: 1. Severe three-vessel coronary artery disease, as detailed below.  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 2. Widely  patent LIMA-LAD and SVG-OM1-LCx. 3. Ostially occluded SVG-rPDA; this appears chronic. 4. Normal left ventricular systolic function. 5. Mildly elevated LVEDP. 6. Successful aspiration thrombectomy of the proximal LAD and PCI to ostial D1 using a Resolute Onyx 2.5 x 12 mm drug-eluting stent.  There is a small amount of residual thrombus in the proximal LAD and 0% residual stenosis with TIMI-3 flow in D1.  Recommendations: 1. Continue tirofiban  infusion for 24 hours. 2. Indefinite dual antiplatelet therapy with aspirin  and prasugrel . 3. Aggressive secondary prevention, including continued alirocumab  use.  Lonni Hanson, MD [facility] Pager: [phone]  Findings Coronary Findings Diagnostic  Dominance: Right  Left Main Vessel is large. 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  Left Anterior Descending The vessel has an aneurysm. Ost LAD to Prox LAD lesion is 70% stenosed. The lesion is heavily thrombotic. Prox LAD lesion is 100% stenosed. Mid LAD lesion is 50% stenosed.  First Diagonal Branch Vessel is moderate in size. Ost 1st Diag lesion is 90% stenosed.  First Septal Branch Vessel is small in size.  Second Septal Branch Vessel is small in size.  Third Diagonal Branch Vessel is small in size.  Left Circumflex Vessel is large. Mid Cx lesion is 90% stenosed. The lesion is eccentric, irregular, thrombotic and ulcerative.  Second Obtuse Marginal Branch Vessel is large in size. This is actually OM1 Ost 2nd Mrg lesion is 95% stenosed. The lesion is located at the bifurcation, eccentric, irregular, thrombotic and ulcerative.  Left Posterior Atrioventricular Artery Vessel is small in size.  Right Coronary Artery Vessel is large. There is mild diffuse disease throughout the vessel. There is mild focal disease in the vessel. The vessel is moderately tortuous.  Right Posterior Descending Artery Ost RPDA lesion is 100% stenosed. The lesion is discrete with left-to-right  collateral flow. Likely subacute occlusion  Right Posterior Atrioventricular Artery Vessel is moderate in size.  First Right Posterolateral Branch Vessel is small in size.  Saphenous Graft To Ost RPDA SVG graft was visualized by angiography. Origin lesion is 100% stenosed. The lesion is chronically occluded.  Sequential Saphenous Graft To 2nd Mrg, Ost 3rd Mrg SVG graft was visualized by angiography and is normal in caliber. The graft exhibits no disease.  LIMA LIMA Graft To Mid LAD LIMA graft was visualized by angiography and is large. The graft exhibits no disease.  Intervention  Ost LAD to Prox LAD lesion Thrombectomy Aspiration thrombectomy performed using a CATH EXTRAC PRONTO 5.98F 138CM. Post-Intervention Lesion Assessment The intervention was successful. Pre-interventional TIMI flow is 3. Post-intervention TIMI flow is 3. No complications occurred at this lesion. There is a 20% residual stenosis post intervention.  Ost 1st Diag lesion Stent A drug-eluting stent was successfully placed using a STENT RESOLUTE ONYX 2.5X12. Post-Intervention Lesion Assessment The intervention was successful. Pre-interventional TIMI flow is 2. Post-intervention TIMI flow is 3. No complications occurred at this lesion. There is a 0% residual stenosis post intervention.   CARDIAC CATHETERIZATION  CARDIAC CATHETERIZATION 10/12/2017  Conclusion Images from the original result were not included.   Ost RPDA lesion is 100% stenosed.  Bifurcation Cx-OM1 Ost 1st Mrg lesion is 95% stenosed. Mid Cx lesion is 90% stenosed.  Prox LAD lesion is 55% stenosed. Mid LAD lesion is 70% stenosed. FFR 0.79.  The left ventricular systolic function is normal. The left ventricular ejection fraction is greater than 65% by visual estimate.  LV end diastolic pressure is normal.   SEVERE 3-4 VESSEL CAD: 100% (Subacute) ostRPDA, 90% mCx @ OM1 with 95% OM1 (bifurcation 0,1,1) lesions (hazy,  irregular/thrombotic), diffuse mLAD ~70% lesion (FFR 0.79).  Mild to moderate disease throughout the RCA with RPL.  Ectatic, aneurysmal left main, ostial-proximal LAD dilation -not optimal for guide placement.  Well-preserved LVEF with mid inferior wall hypokinesis but otherwise no significant wall motion and normalities.  Moderately elevated LVEDP   PLAN OF CARE: Return to nursing unit for ongoing care.  TR band removal per protocol  The patient has three-vessel disease.  We are in the process of calling CV TS for CABG consult  I will start IV Aggrastat  now and restart heparin  in 8 hours given the ulcerated thrombotic  appearance of the circumflex bifurcation lesion and is ongoing pain.  We will start low-dose beta-blocker and high-dose statin  He will had to return back to his nursing unit because there are no stepdown or ICU beds available --he is currently chest pain-free     Alm Clay, M.D., M.S. Interventional Cardiologist  Pager # [phone] Phone # [phone] [address] [place], [place] [address]  Findings Coronary Findings Diagnostic  Dominance: Right  Left Main Vessel is large.  Left Anterior Descending Prox LAD lesion is 55% stenosed. Mid LAD lesion is 70% stenosed. Pressure wire/FFR was performed on the lesion. FFR: 0.79.  First Diagonal Branch Vessel is moderate in size.  First Septal Branch Vessel is small in size.  Second Septal Branch Vessel is small in size.  Third Diagonal Branch Vessel is small in size.  Left Circumflex Vessel is large. Mid Cx lesion is 90% stenosed. The lesion is eccentric, irregular, thrombotic and ulcerative.  Second Obtuse Marginal Branch Vessel is large in size. This is actually OM1 Ost 2nd Mrg lesion is 95% stenosed. The lesion is located at the bifurcation, eccentric, irregular, thrombotic and ulcerative.  Left Posterior Atrioventricular Artery Vessel is small in size.  Right Coronary  Artery Vessel is large. There is mild diffuse disease throughout the vessel. There is mild focal disease in the vessel. The vessel is moderately tortuous.  Right Posterior Descending Artery Ost RPDA lesion is 100% stenosed. The lesion is discrete with left-to-right collateral flow. Likely subacute occlusion  Right Posterior Atrioventricular Artery Vessel is moderate in size.  First Right Posterolateral Branch Vessel is small in size.  Intervention  No interventions have been documented.   STRESS TESTS  MYOCARDIAL PERFUSION IMAGING 02/24/2024  Interpretation Summary   Findings are consistent with ischemia. The study is intermediate risk.   LV perfusion is abnormal. Defect 1: There is a medium defect with moderate reduction in uptake present in the anterior location(s) that is reversible. There is normal wall motion in the defect area. Consistent with ischemia.   Left ventricular function is normal. Nuclear stress EF: 62%. The left ventricular ejection fraction is normal (55-65%). End diastolic cavity size is normal.   Prior study available for comparison from 10/21/2018.   ECHOCARDIOGRAM  ECHOCARDIOGRAM COMPLETE 05/06/2022  Narrative ECHOCARDIOGRAM REPORT    Patient Name:   Scott Rosales Date of Exam: 05/06/2022 Medical Rec #:  [id number]         Height:       71.0 in Accession #:  [id number]        Weight:       269.0 lb Date of Birth:  1961/01/20        BSA:          2.392 m Patient Age:    60 years          BP:           153/93 mmHg Patient Gender: M                 HR:           58 bpm. Exam Location:  [place]  Procedure: 2D Echo, Cardiac Doppler, Color Doppler and Strain Analysis  Indications:    Coronary artery disease of native artery of native heart with stable angina pectoris (HCC) [I25.118 (ICD-10-CM)]; Mixed hyperlipidemia [E78.2 (ICD-10-CM)];  History:        Patient has prior history of Echocardiogram examinations, most recent  08/13/2019.  Sonographer:    Lynwood Silvas RDCS Referring Phys: [id number] BRIAN J MUNLEY  IMPRESSIONS   1. Left ventricular ejection fraction, by estimation, is 60 to 65%. The left ventricle has normal function. The left ventricle has no regional wall motion abnormalities. There is mild left ventricular hypertrophy. Left ventricular diastolic parameters are indeterminate. 2. Right ventricular systolic function is normal. The right ventricular size is normal. There is normal pulmonary artery systolic pressure. 3. The mitral valve is normal in structure. No evidence of mitral valve regurgitation. No evidence of mitral stenosis. 4. The aortic valve is normal in structure. Aortic valve regurgitation is not visualized. No aortic stenosis is present. 5. The inferior vena cava is normal in size with greater than 50% respiratory variability, suggesting right atrial pressure of 3 mmHg.  FINDINGS Left Ventricle: Left ventricular ejection fraction, by estimation, is 60 to 65%. The left ventricle has normal function. The left ventricle has no regional wall motion abnormalities. The left ventricular internal cavity size was normal in size. There is mild left ventricular hypertrophy. Left ventricular diastolic parameters are indeterminate.  Right Ventricle: The right ventricular size is normal. No increase in right ventricular wall thickness. Right ventricular systolic function is normal. There is normal pulmonary artery systolic pressure. The tricuspid regurgitant velocity is 2.39 m/s, and with an assumed right atrial pressure of 3 mmHg, the estimated right ventricular systolic pressure is 25.8 mmHg.  Left Atrium: Left atrial size was normal in size.  Right Atrium: Right atrial size was normal in size.  Pericardium: There is no evidence of pericardial effusion.  Mitral Valve: The mitral valve is normal in structure. No evidence of mitral valve regurgitation. No evidence of mitral valve stenosis.  Tricuspid  Valve: The tricuspid valve is normal in structure. Tricuspid valve regurgitation is mild . No evidence of tricuspid stenosis.  Aortic Valve: The aortic valve is normal in structure. Aortic valve regurgitation is not visualized. No aortic stenosis is present.  Pulmonic Valve: The pulmonic valve was normal in structure. Pulmonic valve regurgitation is not visualized. No evidence of pulmonic stenosis.  Aorta: The aortic root is normal in size and structure.  Venous: The inferior vena cava is normal in size with greater than 50% respiratory variability, suggesting right atrial pressure of 3 mmHg.  IAS/Shunts: No atrial level shunt detected by color flow Doppler.   LEFT VENTRICLE PLAX 2D LVIDd:         5.10 cm   Diastology LVIDs:         3.50 cm   LV e' medial:  9.46 cm/s LV PW:         1.60 cm   LV E/e' medial:  11.4 LV IVS:        1.60 cm   LV e' lateral:   10.65 cm/s LVOT diam:     2.10 cm   LV E/e' lateral: 10.1 LV SV:         95 LV SV Index:   40 LVOT Area:     3.46 cm   RIGHT VENTRICLE             IVC RV S prime:     10.70 cm/s  IVC diam: 1.60 cm TAPSE (M-mode): 2.2 cm  LEFT ATRIUM             Index        RIGHT ATRIUM           Index LA diam:        4.20 cm 1.76 cm/m   RA Area:     14.90 cm LA Vol (A2C):   81.1 ml 33.91 ml/m  RA Volume:   37.90 ml  15.85 ml/m LA Vol (A4C):   62.4 ml 26.09 ml/m LA Biplane Vol: 75.0 ml 31.36 ml/m AORTIC VALVE             PULMONIC VALVE LVOT Vmax:   113.00 cm/s PR End Diast Vel: 4.75 msec LVOT Vmean:  85.900 cm/s LVOT VTI:    0.274 m  AORTA Ao Root diam: 3.90 cm Ao Asc diam:  3.70 cm Ao Desc diam: 2.70 cm  MITRAL VALVE                TRICUSPID VALVE MV Area (PHT): 4.19 cm     TR Peak grad:   22.8 mmHg MV Decel Time: 181 msec     TR Vmax:        239.00 cm/s MV E velocity: 108.00 cm/s MV A velocity: 94.30 cm/s   SHUNTS MV E/A ratio:  1.15         Systemic VTI:  0.27 m Systemic Diam: 2.10 cm  Lamar Fitch  MD Electronically signed by Lamar Fitch MD Signature Date/Time: 05/06/2022/4:34:13 PM    Final   TEE  ECHO TEE 10/13/2017  Interpretation Summary  Septum: No Patent Foramen Ovale present.  Left atrium: Patent foramen ovale not present.  Left atrium: No spontaneous echo contrast.  Aortic valve: The valve is trileaflet. No stenosis. No regurgitation.  Mitral valve: No leaflet thickening and calcification present. Trace regurgitation.  Right ventricle: Normal wall thickness and ejection fraction. Cavity is mildly dilated. Normal tricuspid annular plane systolic excursion (TAPSE).  Tricuspid valve: Trace regurgitation.  MONITORS  LONG TERM MONITOR (3-14 DAYS) 09/21/2020  Narrative The patient wore the monitor for 30 days, 2 hours starting September 04, 2020. Indication: Palpitations The minimum heart rate was 47 bpm, maximum heart rate was 111 bpm, and average heart rate was 61 bpm. Predominant underlying rhythm was Sinus Rhythm.   Premature atrial complexes were rare less than 1%. Premature Ventricular complexes were rare less than 1%.  No ventricular tachycardia, no pauses, No AV block, and no atrial fibrillation present. 1 patient triggered event noted associated with sinus rhythm.  Conclusion: Normal/unremarkable study with no evidence of significant arrhythmia.       ______________________________________________________________________________________________      Risk Assessment/Calculations:         STOP-Bang Score:  8      Physical Exam:   VS:  BP 130/78  Pulse (!) 56   Ht 5' 11 (1.803 m)   Wt 271 lb (122.9 kg)   SpO2 95%   BMI 37.80 kg/m    Wt Readings from Last 3 Encounters:  05/18/24 271 lb (122.9 kg)  03/04/24 271 lb (122.9 kg)  02/24/24 275 lb (124.7 kg)    GEN: Well nourished, well developed in no acute distress NECK: No JVD; No carotid bruits CARDIAC: RRR, no murmurs, rubs, gallops RESPIRATORY:  Clear to auscultation without  rales, wheezing or rhonchi  ABDOMEN: Soft, non-tender, non-distended EXTREMITIES: Trace pedal edema; No deformity   ASSESSMENT AND PLAN: .   CAD-s/p CABG x 4 in 2018 >> left heart cath 2020 with aspiration thrombectomy of the proximal LAD and PCI to the ostial D1 with a DES; LIMA to LAD and SVG to OM1 and left circumflex were patent but ostially SVG to RPDA was occluded >> Lexiscan  02/2024 revealed ischemia, intermediate risk.  Tried him on low-dose Imdur  however he states this did not make him feel well so he subsequently self discontinued.  Continue aspirin  81 mg daily, continue Plavix  75 mg daily, continue acebutelol 200 mg daily, continue Repatha .  Previously tried to get him approved for Wegovy , but it appears his insurance will not cover this.  Heart healthy diet and regular cardiovascular exercise encouraged.    Leg weakness-he had ABIs and lower extremity arterial duplex back in 2022 related to similar symptoms, which was negative for PAD.  Symptoms are overall unchanged.  He does have some pedal edema. He apparently had a biopsy some years ago for this as well, which was unrevealing.  Pedal edema-trace pedal edema, he has Lasix  as needed, encouraged compression socks. Will repeat BMET, if ok, suggest lasix  daily.  Hypertension-his blood pressure is slightly elevated at 130/90, continue Zestoretic 20-12.5 mg daily.  Snoring-STOP-BANG score of 8, previously arrange for an at home sleep evaluation which was negative for sleep apnea.    Obesity-BMI is 37.8, he does not have diabetes, unfortunately his insurance will not approve Wegovy .  He has been making dietary changes and I encouraged him to be purposely working out.    Dispo: BMET today, follow-up in 6 months.    Signed, Delon JAYSON Hoover, NP

## 2024-05-18 ENCOUNTER — Ambulatory Visit: Attending: Cardiology | Admitting: Cardiology

## 2024-05-18 ENCOUNTER — Encounter: Payer: Self-pay | Admitting: Cardiology

## 2024-05-18 VITALS — BP 130/78 | HR 56 | Ht 71.0 in | Wt 271.0 lb

## 2024-05-18 DIAGNOSIS — Z951 Presence of aortocoronary bypass graft: Secondary | ICD-10-CM | POA: Diagnosis not present

## 2024-05-18 DIAGNOSIS — E782 Mixed hyperlipidemia: Secondary | ICD-10-CM

## 2024-05-18 DIAGNOSIS — R600 Localized edema: Secondary | ICD-10-CM

## 2024-05-18 DIAGNOSIS — I25118 Atherosclerotic heart disease of native coronary artery with other forms of angina pectoris: Secondary | ICD-10-CM

## 2024-05-18 DIAGNOSIS — I10 Essential (primary) hypertension: Secondary | ICD-10-CM | POA: Diagnosis not present

## 2024-05-18 DIAGNOSIS — R29898 Other symptoms and signs involving the musculoskeletal system: Secondary | ICD-10-CM

## 2024-05-18 NOTE — Patient Instructions (Signed)
 Medication Instructions:   No changes today   *If you need a refill on your cardiac medications before your next appointment, please call your pharmacy*  Lab Work:  BMET   If you have labs (blood work) drawn today and your tests are completely normal, you will receive your results only by: MyChart Message (if you have MyChart) OR A paper copy in the mail If you have any lab test that is abnormal or we need to change your treatment, we will call you to review the results.  Testing/Procedures:  None  Follow-Up: At [facility], you and your health needs are our priority.  As part of our continuing mission to provide you with exceptional heart care, our providers are all part of one team.  This team includes your primary Cardiologist (physician) and Advanced Practice Providers or APPs (Physician Assistants and Nurse Practitioners) who all work together to provide you with the care you need, when you need it.  Your next appointment:   6 month(s)  Provider:   Delon Hoover, NP [place])    We recommend signing up for the patient portal called MyChart.  Sign up information is provided on this After Visit Summary.  MyChart is used to connect with patients for Virtual Visits (Telemedicine).  Patients are able to view lab/test results, encounter notes, upcoming appointments, etc.  Non-urgent messages can be sent to your provider as well.   To learn more about what you can do with MyChart, go to [email or web address].   Other Instructions  Have a great summer!!  Walk 10 minutes per day!!!

## 2024-05-19 ENCOUNTER — Other Ambulatory Visit: Payer: Self-pay

## 2024-05-19 ENCOUNTER — Ambulatory Visit: Payer: Self-pay | Admitting: Cardiology

## 2024-05-19 LAB — BASIC METABOLIC PANEL WITH GFR
BUN/Creatinine Ratio: 17 (ref 10–24)
BUN: 20 mg/dL (ref 8–27)
CO2: 19 mmol/L — ABNORMAL LOW (ref 20–29)
Calcium: 9.4 mg/dL (ref 8.6–10.2)
Chloride: 105 mmol/L (ref 96–106)
Creatinine, Ser: 1.17 mg/dL (ref 0.76–1.27)
Glucose: 95 mg/dL (ref 70–99)
Potassium: 4.3 mmol/L (ref 3.5–5.2)
Sodium: 140 mmol/L (ref 134–144)
eGFR: 70 mL/min/1.73

## 2024-06-13 ENCOUNTER — Other Ambulatory Visit: Payer: Self-pay | Admitting: Cardiology

## 2024-06-13 DIAGNOSIS — I25118 Atherosclerotic heart disease of native coronary artery with other forms of angina pectoris: Secondary | ICD-10-CM

## 2024-06-13 DIAGNOSIS — I493 Ventricular premature depolarization: Secondary | ICD-10-CM

## 2024-09-10 ENCOUNTER — Other Ambulatory Visit: Payer: Self-pay | Admitting: Cardiology

## 2024-09-10 DIAGNOSIS — I214 Non-ST elevation (NSTEMI) myocardial infarction: Secondary | ICD-10-CM

## 2024-09-10 DIAGNOSIS — I25118 Atherosclerotic heart disease of native coronary artery with other forms of angina pectoris: Secondary | ICD-10-CM

## 2024-09-10 DIAGNOSIS — E782 Mixed hyperlipidemia: Secondary | ICD-10-CM

## 2024-12-16 NOTE — Progress Notes (Unsigned)
 " Cardiology Office Note:    Date:  12/17/2024   ID:  Scott Rosales, DOB 02/02/1961, MRN [id number]  PCP:  Lajoyce Bruckner, MD  Cardiologist:  Redell Leiter, MD    Referring MD: Lajoyce Bruckner, MD    ASSESSMENT:    1. Coronary artery disease of native artery of native heart with stable angina pectoris   2. Hx of CABG   3. Mixed hyperlipidemia   4. Essential hypertension   5. PVC's (premature ventricular contractions)    PLAN:    In order of problems listed above:  Scott Rosales continues to do well long-term with CAD following CABG and PCI and stent with acute COVID infection and intraluminal thrombosis Continue his medical therapy including aspirin  and clopidogrel  along with nonstatin Repatha  oral nitrate and beta-blocker When available he would be ideal for dual counting photon CTA which allows stent evaluation likely in the next 1 to 2 years Lipids are exceptional recheck labs today including comprehensive panel APO B for particle size lipid profile and a D3 at his request BP at target continue current treatment Good control of PVCs with his beta-blocker   Next appointment: 36-month follow-up   Medication Adjustments/Labs and Tests Ordered: Current medicines are reviewed at length with the patient today.  Concerns regarding medicines are outlined above.  No orders of the defined types were placed in this encounter.  No orders of the defined types were placed in this encounter.    History of Present Illness:    Scott Rosales is a 64 y.o. male with a hx of CAD with CABG in 2018 and PCI and drug-eluting stent 2020 during COVID infection hypertension PVCs junctional bradycardia dyslipidemia and CKD last seen 06/07/2024 by Delon Hoover nurse practitioner.  Most recent ejection fraction 60 to 65% by echo June 2023  Compliance with diet, lifestyle and medications: Yes  He continues to follow with oncology for his prostate cancer He remains very vigorous  active exercises and tolerates Repatha  and his last lipid profile was exceptional. He has had no anginal discomfort edema shortness of breath chest pain palpitation or syncope His EKG today does not show bradycardia or PVCs LVH repolarization Past Medical History:  Diagnosis Date   AIVR (accelerated idioventricular rhythm)    a. reperfusion rhythm noted 08/2019 after PCI.   CAD (coronary artery disease), native coronary artery 10/15/2017   a. NSTEMI 10/2017 s/p CABGx4 with LIMA to LAD, SVG to OM1-2, SVG to PDA. b. NSTEMI 08/2019 s/p PTCA of prox LAD and PCI/DES to ostial D1, normal LVEF.   CKD (chronic kidney disease), stage II    GERD (gastroesophageal reflux disease) 06/14/2013   Hypercholesteremia 05/19/2007   Hyperlipidemia 10/16/2017   Hypertension 06/14/2013   Junctional rhythm 08/13/2019   Lightheadedness 09/04/2020   Non-ST elevation (NSTEMI) myocardial infarction (HCC) 10/12/2017   Palpitation 09/09/2018   Pre-hypertension 06/14/2013   Prediabetes 12/26/2017   PVC's (premature ventricular contractions) 10/27/2018   S/P CABG x 4 10/13/2017    Current Medications: Active Medications[1]    EKGs/Labs/Other Studies Reviewed:    The following studies were reviewed today:  Cardiac Studies & Procedures   ______________________________________________________________________________________________ CARDIAC CATHETERIZATION  CARDIAC CATHETERIZATION 08/12/2019  Conclusion Conclusions: 1. Severe three-vessel coronary artery disease, as detailed below.  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 2. Widely patent LIMA-LAD and SVG-OM1-LCx. 3. Ostially occluded SVG-rPDA; this appears chronic.  4. Normal left ventricular systolic function. 5. Mildly elevated LVEDP. 6. Successful aspiration thrombectomy of the proximal LAD and PCI to ostial D1 using a Resolute Onyx 2.5 x  12 mm drug-eluting stent.  There is a small amount of residual thrombus in the proximal LAD and 0% residual stenosis with TIMI-3 flow in D1.  Recommendations: 1. Continue tirofiban  infusion for 24 hours. 2. Indefinite dual antiplatelet therapy with aspirin  and prasugrel . 3. Aggressive secondary prevention, including continued alirocumab  use.  Lonni Hanson, MD [facility] Pager: [phone]  Findings Coronary Findings Diagnostic  Dominance: Right  Left Main Vessel is large. 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  Left Anterior Descending The vessel has an aneurysm. Ost LAD to Prox LAD lesion is 70% stenosed. The lesion is heavily thrombotic. Prox LAD lesion is 100% stenosed. Mid LAD lesion is 50% stenosed.  First Diagonal Branch Vessel is moderate in size. Ost 1st Diag lesion is 90% stenosed.  First Septal Branch Vessel is small in size.  Second Septal Branch Vessel is small in size.  Third Diagonal Branch Vessel is small in size.  Left Circumflex Vessel is large. Mid Cx lesion is 90% stenosed. The lesion is eccentric, irregular, thrombotic and ulcerative.  Second Obtuse Marginal Branch Vessel is large in size. This is actually OM1 Ost 2nd Mrg lesion is 95% stenosed. The lesion is located at the bifurcation, eccentric, irregular, thrombotic and ulcerative.  Left Posterior Atrioventricular Artery Vessel is small in size.  Right Coronary Artery Vessel is large. There is mild diffuse disease throughout the vessel. There is mild focal disease in the vessel. The vessel is moderately tortuous.  Right Posterior Descending Artery Ost RPDA lesion is 100% stenosed. The lesion is discrete with left-to-right collateral flow. Likely subacute occlusion  Right Posterior Atrioventricular Artery Vessel is moderate in size.  First Right Posterolateral Branch Vessel is small in size.  Saphenous Graft To Ost RPDA SVG graft was visualized by angiography. Origin lesion is 100%  stenosed. The lesion is chronically occluded.  Sequential Saphenous Graft To 2nd Mrg, Ost 3rd Mrg SVG graft was visualized by angiography and is normal in caliber. The graft exhibits no disease.  LIMA LIMA Graft To Mid LAD LIMA graft was visualized by angiography and is large. The graft exhibits no disease.  Intervention  Ost LAD to Prox LAD lesion Thrombectomy Aspiration thrombectomy performed using a CATH EXTRAC PRONTO 5.72F 138CM. Post-Intervention Lesion Assessment The intervention was successful. Pre-interventional TIMI flow is 3. Post-intervention TIMI flow is 3. No complications occurred at this lesion. There is a 20% residual stenosis post intervention.  Ost 1st Diag lesion Stent A drug-eluting stent was successfully placed using a STENT RESOLUTE ONYX 2.5X12. Post-Intervention Lesion Assessment The intervention was successful. Pre-interventional TIMI flow is 2. Post-intervention TIMI flow is 3. No complications occurred at this lesion. There is a 0% residual stenosis post intervention.   CARDIAC CATHETERIZATION  CARDIAC CATHETERIZATION 10/12/2017  Conclusion Images from the original result were not included.   Ost RPDA lesion is 100% stenosed.  Bifurcation Cx-OM1 Ost 1st Mrg lesion is 95% stenosed. Mid Cx lesion is 90% stenosed.  Prox LAD lesion is 55% stenosed. Mid LAD lesion is 70% stenosed. FFR 0.79.  The left ventricular systolic function is normal. The left ventricular ejection fraction is greater than 65% by visual estimate.  LV end diastolic pressure is normal.   SEVERE 3-4 VESSEL CAD: 100% (Subacute) ostRPDA, 90% mCx @ OM1 with 95% OM1 (bifurcation 0,1,1) lesions (hazy, irregular/thrombotic), diffuse mLAD ~70% lesion (  FFR 0.79).  Mild to moderate disease throughout the RCA with RPL.  Ectatic, aneurysmal left main, ostial-proximal LAD dilation -not optimal for guide placement.  Well-preserved LVEF with mid inferior wall hypokinesis but otherwise no  significant wall motion and normalities.  Moderately elevated LVEDP   PLAN OF CARE: Return to nursing unit for ongoing care.  TR band removal per protocol  The patient has three-vessel disease.  We are in the process of calling CV TS for CABG consult  I will start IV Aggrastat  now and restart heparin  in 8 hours given the ulcerated thrombotic  appearance of the circumflex bifurcation lesion and is ongoing pain.  We will start low-dose beta-blocker and high-dose statin  He will had to return back to his nursing unit because there are no stepdown or ICU beds available --he is currently chest pain-free     Alm Clay, M.D., M.S. Interventional Cardiologist  Pager # [phone] Phone # [phone] [address] [place], [place] [address]  Findings Coronary Findings Diagnostic  Dominance: Right  Left Main Vessel is large.  Left Anterior Descending Prox LAD lesion is 55% stenosed. Mid LAD lesion is 70% stenosed. Pressure wire/FFR was performed on the lesion. FFR: 0.79.  First Diagonal Branch Vessel is moderate in size.  First Septal Branch Vessel is small in size.  Second Septal Branch Vessel is small in size.  Third Diagonal Branch Vessel is small in size.  Left Circumflex Vessel is large. Mid Cx lesion is 90% stenosed. The lesion is eccentric, irregular, thrombotic and ulcerative.  Second Obtuse Marginal Branch Vessel is large in size. This is actually OM1 Ost 2nd Mrg lesion is 95% stenosed. The lesion is located at the bifurcation, eccentric, irregular, thrombotic and ulcerative.  Left Posterior Atrioventricular Artery Vessel is small in size.  Right Coronary Artery Vessel is large. There is mild diffuse disease throughout the vessel. There is mild focal disease in the vessel. The vessel is moderately tortuous.  Right Posterior Descending Artery Ost RPDA lesion is 100% stenosed. The lesion is discrete with left-to-right collateral flow.  Likely subacute occlusion  Right Posterior Atrioventricular Artery Vessel is moderate in size.  First Right Posterolateral Branch Vessel is small in size.  Intervention  No interventions have been documented.   STRESS TESTS  MYOCARDIAL PERFUSION IMAGING 02/24/2024  Interpretation Summary   Findings are consistent with ischemia. The study is intermediate risk.   LV perfusion is abnormal. Defect 1: There is a medium defect with moderate reduction in uptake present in the anterior location(s) that is reversible. There is normal wall motion in the defect area. Consistent with ischemia.   Left ventricular function is normal. Nuclear stress EF: 62%. The left ventricular ejection fraction is normal (55-65%). End diastolic cavity size is normal.   Prior study available for comparison from 10/21/2018.   ECHOCARDIOGRAM  ECHOCARDIOGRAM COMPLETE 05/06/2022  Narrative ECHOCARDIOGRAM REPORT    Patient Name:   Scott Rosales Date of Exam: 05/06/2022 Medical Rec #:  [id number]         Height:       71.0 in Accession #:    [id number]        Weight:       269.0 lb Date of Birth:  [birth year]        BSA:          2.392 m Patient Age:    60 years          BP:  153/93 mmHg Patient Gender: M                 HR:           58 bpm. Exam Location:  [facility]  Procedure: 2D Echo, Cardiac Doppler, Color Doppler and Strain Analysis  Indications:    Coronary artery disease of native artery of native heart with stable angina pectoris (HCC) [I25.118 (ICD-10-CM)]; Mixed hyperlipidemia [E78.2 (ICD-10-CM)];  History:        Patient has prior history of Echocardiogram examinations, most recent 08/13/2019.  Sonographer:    Lynwood Silvas RDCS Referring Phys: [id number] Monik Lins J Braya Habermehl  IMPRESSIONS   1. Left ventricular ejection fraction, by estimation, is 60 to 65%. The left ventricle has normal function. The left ventricle has no regional wall motion abnormalities. There is mild left ventricular  hypertrophy. Left ventricular diastolic parameters are indeterminate. 2. Right ventricular systolic function is normal. The right ventricular size is normal. There is normal pulmonary artery systolic pressure. 3. The mitral valve is normal in structure. No evidence of mitral valve regurgitation. No evidence of mitral stenosis. 4. The aortic valve is normal in structure. Aortic valve regurgitation is not visualized. No aortic stenosis is present. 5. The inferior vena cava is normal in size with greater than 50% respiratory variability, suggesting right atrial pressure of 3 mmHg.  FINDINGS Left Ventricle: Left ventricular ejection fraction, by estimation, is 60 to 65%. The left ventricle has normal function. The left ventricle has no regional wall motion abnormalities. The left ventricular internal cavity size was normal in size. There is mild left ventricular hypertrophy. Left ventricular diastolic parameters are indeterminate.  Right Ventricle: The right ventricular size is normal. No increase in right ventricular wall thickness. Right ventricular systolic function is normal. There is normal pulmonary artery systolic pressure. The tricuspid regurgitant velocity is 2.39 m/s, and with an assumed right atrial pressure of 3 mmHg, the estimated right ventricular systolic pressure is 25.8 mmHg.  Left Atrium: Left atrial size was normal in size.  Right Atrium: Right atrial size was normal in size.  Pericardium: There is no evidence of pericardial effusion.  Mitral Valve: The mitral valve is normal in structure. No evidence of mitral valve regurgitation. No evidence of mitral valve stenosis.  Tricuspid Valve: The tricuspid valve is normal in structure. Tricuspid valve regurgitation is mild . No evidence of tricuspid stenosis.  Aortic Valve: The aortic valve is normal in structure. Aortic valve regurgitation is not visualized. No aortic stenosis is present.  Pulmonic Valve: The pulmonic valve was  normal in structure. Pulmonic valve regurgitation is not visualized. No evidence of pulmonic stenosis.  Aorta: The aortic root is normal in size and structure.  Venous: The inferior vena cava is normal in size with greater than 50% respiratory variability, suggesting right atrial pressure of 3 mmHg.  IAS/Shunts: No atrial level shunt detected by color flow Doppler.   LEFT VENTRICLE PLAX 2D LVIDd:         5.10 cm   Diastology LVIDs:         3.50 cm   LV e' medial:    9.46 cm/s LV PW:         1.60 cm   LV E/e' medial:  11.4 LV IVS:        1.60 cm   LV e' lateral:   10.65 cm/s LVOT diam:     2.10 cm   LV E/e' lateral: 10.1 LV SV:  95 LV SV Index:   40 LVOT Area:     3.46 cm   RIGHT VENTRICLE             IVC RV S prime:     10.70 cm/s  IVC diam: 1.60 cm TAPSE (M-mode): 2.2 cm  LEFT ATRIUM             Index        RIGHT ATRIUM           Index LA diam:        4.20 cm 1.76 cm/m   RA Area:     14.90 cm LA Vol (A2C):   81.1 ml 33.91 ml/m  RA Volume:   37.90 ml  15.85 ml/m LA Vol (A4C):   62.4 ml 26.09 ml/m LA Biplane Vol: 75.0 ml 31.36 ml/m AORTIC VALVE             PULMONIC VALVE LVOT Vmax:   113.00 cm/s PR End Diast Vel: 4.75 msec LVOT Vmean:  85.900 cm/s LVOT VTI:    0.274 m  AORTA Ao Root diam: 3.90 cm Ao Asc diam:  3.70 cm Ao Desc diam: 2.70 cm  MITRAL VALVE                TRICUSPID VALVE MV Area (PHT): 4.19 cm     TR Peak grad:   22.8 mmHg MV Decel Time: 181 msec     TR Vmax:        239.00 cm/s MV E velocity: 108.00 cm/s MV A velocity: 94.30 cm/s   SHUNTS MV E/A ratio:  1.15         Systemic VTI:  0.27 m Systemic Diam: 2.10 cm  Lamar Fitch MD Electronically signed by Lamar Fitch MD Signature Date/Time: 05/06/2022/4:34:13 PM    Final   TEE  ECHO TEE 10/13/2017  Interpretation Summary  Septum: No Patent Foramen Ovale present.  Left atrium: Patent foramen ovale not present.  Left atrium: No spontaneous echo contrast.  Aortic valve:  The valve is trileaflet. No stenosis. No regurgitation.  Mitral valve: No leaflet thickening and calcification present. Trace regurgitation.  Right ventricle: Normal wall thickness and ejection fraction. Cavity is mildly dilated. Normal tricuspid annular plane systolic excursion (TAPSE).  Tricuspid valve: Trace regurgitation.  MONITORS  LONG TERM MONITOR (3-14 DAYS) 09/21/2020  Narrative The patient wore the monitor for 30 days, 2 hours starting September 04, 2020. Indication: Palpitations The minimum heart rate was 47 bpm, maximum heart rate was 111 bpm, and average heart rate was 61 bpm. Predominant underlying rhythm was Sinus Rhythm.   Premature atrial complexes were rare less than 1%. Premature Ventricular complexes were rare less than 1%.  No ventricular tachycardia, no pauses, No AV block, and no atrial fibrillation present. 1 patient triggered event noted associated with sinus rhythm.  Conclusion: Normal/unremarkable study with no evidence of significant arrhythmia.       ______________________________________________________________________________________________          Recent Labs: 05/18/2024: BUN 20; Creatinine, Ser 1.17; Potassium 4.3; Sodium 140  Recent Lipid Panel    Component Value Date/Time   CHOL 139 02/17/2024 1145   TRIG 141 02/17/2024 1145   HDL 56 02/17/2024 1145   CHOLHDL 2.5 02/17/2024 1145   CHOLHDL 4.4 10/12/2017 0139   VLDL 25 10/12/2017 0139   LDLCALC 59 02/17/2024 1145   LDLDIRECT 57 08/25/2019 1053    Physical Exam:    VS:  BP 114/72   Pulse 60   Ht  5' 10 (1.778 m)   Wt 275 lb (124.7 kg)   SpO2 96%   BMI 39.46 kg/m     Wt Readings from Last 3 Encounters:  12/17/24 275 lb (124.7 kg)  05/18/24 271 lb (122.9 kg)  03/04/24 271 lb (122.9 kg)     GEN:  Well nourished, well developed in no acute distress HEENT: Normal NECK: No JVD; No carotid bruits LYMPHATICS: No lymphadenopathy CARDIAC: RRR, no murmurs, rubs,  gallops RESPIRATORY:  Clear to auscultation without rales, wheezing or rhonchi  ABDOMEN: Soft, non-tender, non-distended MUSCULOSKELETAL:  No edema; No deformity  SKIN: Warm and dry NEUROLOGIC:  Alert and oriented x 3 PSYCHIATRIC:  Normal affect    Signed, Redell Leiter, MD  12/17/2024 10:12 AM    [facility]      [1]  Current Meds  Medication Sig   acebutolol  (SECTRAL ) 200 MG capsule TAKE 1 CAPSULE BY MOUTH DAILY   aspirin  EC 81 MG tablet Take 81 mg by mouth daily.   clopidogrel  (PLAVIX ) 75 MG tablet TAKE 1 TABLET BY MOUTH DAILY   Coenzyme Q10 (CO Q-10 PO) Take 1 capsule by mouth daily.   Evolocumab  (REPATHA  SURECLICK) 140 MG/ML SOAJ INJECT 1 PEN SUBCUTANEOUSLY  EVERY 2 WEEKS   furosemide  (LASIX ) 20 MG tablet Take 1 tablet (20 mg total) by mouth as needed for edema or fluid (pedal edema).   isosorbide  mononitrate (IMDUR ) 30 MG 24 hr tablet Take 1 tablet (30 mg total) by mouth daily.   Multiple Vitamins-Minerals (MENS 50+ MULTI VITAMIN/MIN) TABS Take 1 tablet by mouth daily.   omeprazole (PRILOSEC) 20 MG capsule Take 20 mg by mouth daily as needed for indigestion.   tadalafil  (CIALIS ) 10 MG tablet Take 1 tablet (10 mg total) by mouth daily as needed for erectile dysfunction.   tamsulosin (FLOMAX) 0.4 MG CAPS capsule Take 0.4 mg by mouth daily.   "

## 2024-12-17 ENCOUNTER — Ambulatory Visit: Admitting: Cardiology

## 2024-12-17 ENCOUNTER — Encounter: Payer: Self-pay | Admitting: Cardiology

## 2024-12-17 VITALS — BP 114/72 | HR 60 | Ht 70.0 in | Wt 275.0 lb

## 2024-12-17 DIAGNOSIS — I25118 Atherosclerotic heart disease of native coronary artery with other forms of angina pectoris: Secondary | ICD-10-CM

## 2024-12-17 DIAGNOSIS — Z951 Presence of aortocoronary bypass graft: Secondary | ICD-10-CM

## 2024-12-17 DIAGNOSIS — I493 Ventricular premature depolarization: Secondary | ICD-10-CM

## 2024-12-17 DIAGNOSIS — I10 Essential (primary) hypertension: Secondary | ICD-10-CM

## 2024-12-17 DIAGNOSIS — E782 Mixed hyperlipidemia: Secondary | ICD-10-CM

## 2024-12-17 NOTE — Patient Instructions (Signed)
 Medication Instructions:  Your physician recommends that you continue on your current medications as directed. Please refer to the Current Medication list given to you today.  *If you need a refill on your cardiac medications before your next appointment, please call your pharmacy*  Lab Work: Your physician recommends that you return for lab work in:   Labs today: CMP, Lipids, Apo B, Vitamin D3  If you have labs (blood work) drawn today and your tests are completely normal, you will receive your results only by: MyChart Message (if you have MyChart) OR A paper copy in the mail If you have any lab test that is abnormal or we need to change your treatment, we will call you to review the results.  Testing/Procedures: None  Follow-Up: At [facility], you and your health needs are our priority.  As part of our continuing mission to provide you with exceptional heart care, our providers are all part of one team.  This team includes your primary Cardiologist (physician) and Advanced Practice Providers or APPs (Physician Assistants and Nurse Practitioners) who all work together to provide you with the care you need, when you need it.  Your next appointment:   9 month(s)  Provider:   Redell Leiter, MD    We recommend signing up for the patient portal called MyChart.  Sign up information is provided on this After Visit Summary.  MyChart is used to connect with patients for Virtual Visits (Telemedicine).  Patients are able to view lab/test results, encounter notes, upcoming appointments, etc.  Non-urgent messages can be sent to your provider as well.   To learn more about what you can do with MyChart, go to [email or web address].   Other Instructions None
